# Patient Record
Sex: Female | Born: 1960
Health system: Southern US, Community
[De-identification: ages and names within clinical notes are randomized; demographics above are authoritative.]

## PROBLEM LIST (undated history)

## (undated) DIAGNOSIS — R112 Nausea with vomiting, unspecified: Secondary | ICD-10-CM

## (undated) DIAGNOSIS — G4730 Sleep apnea, unspecified: Secondary | ICD-10-CM

## (undated) DIAGNOSIS — H269 Unspecified cataract: Secondary | ICD-10-CM

## (undated) DIAGNOSIS — T7840XA Allergy, unspecified, initial encounter: Secondary | ICD-10-CM

## (undated) DIAGNOSIS — R011 Cardiac murmur, unspecified: Secondary | ICD-10-CM

## (undated) DIAGNOSIS — G500 Trigeminal neuralgia: Secondary | ICD-10-CM

## (undated) DIAGNOSIS — C801 Malignant (primary) neoplasm, unspecified: Secondary | ICD-10-CM

## (undated) DIAGNOSIS — D239 Other benign neoplasm of skin, unspecified: Secondary | ICD-10-CM

## (undated) DIAGNOSIS — E079 Disorder of thyroid, unspecified: Secondary | ICD-10-CM

## (undated) DIAGNOSIS — G43909 Migraine, unspecified, not intractable, without status migrainosus: Secondary | ICD-10-CM

## (undated) DIAGNOSIS — Z9889 Other specified postprocedural states: Secondary | ICD-10-CM

## (undated) DIAGNOSIS — E119 Type 2 diabetes mellitus without complications: Secondary | ICD-10-CM

## (undated) HISTORY — DX: Allergy, unspecified, initial encounter: T78.40XA

## (undated) HISTORY — DX: Unspecified cataract: H26.9

## (undated) HISTORY — PX: HERNIA REPAIR: SHX51

## (undated) HISTORY — DX: Disorder of thyroid, unspecified: E07.9

## (undated) HISTORY — PX: OTHER SURGICAL HISTORY: SHX169

## (undated) HISTORY — PX: TONSILLECTOMY: SHX5217

## (undated) HISTORY — DX: Type 2 diabetes mellitus without complications: E11.9

---
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
## 1996-09-16 HISTORY — PX: CHOLECYSTECTOMY: SHX55

## 2000-09-16 DIAGNOSIS — C801 Malignant (primary) neoplasm, unspecified: Secondary | ICD-10-CM

## 2000-09-16 HISTORY — PX: ABDOMINAL HYSTERECTOMY: SHX81

## 2000-09-16 HISTORY — DX: Malignant (primary) neoplasm, unspecified: C80.1

## 2005-09-16 DIAGNOSIS — Z86018 Personal history of other benign neoplasm: Secondary | ICD-10-CM

## 2005-09-16 HISTORY — DX: Personal history of other benign neoplasm: Z86.018

## 2007-08-23 DIAGNOSIS — N83209 Unspecified ovarian cyst, unspecified side: Secondary | ICD-10-CM | POA: Insufficient documentation

## 2007-08-23 DIAGNOSIS — K589 Irritable bowel syndrome without diarrhea: Secondary | ICD-10-CM | POA: Insufficient documentation

## 2007-08-23 DIAGNOSIS — K219 Gastro-esophageal reflux disease without esophagitis: Secondary | ICD-10-CM | POA: Insufficient documentation

## 2007-08-23 DIAGNOSIS — G4700 Insomnia, unspecified: Secondary | ICD-10-CM | POA: Insufficient documentation

## 2007-08-23 DIAGNOSIS — E538 Deficiency of other specified B group vitamins: Secondary | ICD-10-CM | POA: Insufficient documentation

## 2008-01-31 ENCOUNTER — Emergency Department: Payer: Self-pay | Admitting: Emergency Medicine

## 2008-02-19 DIAGNOSIS — E1169 Type 2 diabetes mellitus with other specified complication: Secondary | ICD-10-CM | POA: Insufficient documentation

## 2008-02-19 DIAGNOSIS — E781 Pure hyperglyceridemia: Secondary | ICD-10-CM | POA: Insufficient documentation

## 2008-04-26 ENCOUNTER — Ambulatory Visit: Payer: Self-pay | Admitting: Obstetrics and Gynecology

## 2009-01-06 DIAGNOSIS — R7309 Other abnormal glucose: Secondary | ICD-10-CM | POA: Insufficient documentation

## 2009-01-06 DIAGNOSIS — J309 Allergic rhinitis, unspecified: Secondary | ICD-10-CM | POA: Insufficient documentation

## 2009-01-16 ENCOUNTER — Ambulatory Visit: Payer: Self-pay | Admitting: Family Medicine

## 2009-01-17 ENCOUNTER — Ambulatory Visit: Payer: Self-pay | Admitting: Family Medicine

## 2009-01-24 ENCOUNTER — Ambulatory Visit: Payer: Self-pay | Admitting: Family Medicine

## 2009-02-15 ENCOUNTER — Ambulatory Visit: Payer: Self-pay | Admitting: Family Medicine

## 2009-02-22 ENCOUNTER — Ambulatory Visit: Payer: Self-pay | Admitting: Family Medicine

## 2009-02-25 DIAGNOSIS — N390 Urinary tract infection, site not specified: Secondary | ICD-10-CM | POA: Insufficient documentation

## 2009-03-06 ENCOUNTER — Encounter: Admission: RE | Admit: 2009-03-06 | Discharge: 2009-03-06 | Payer: Self-pay | Admitting: Neurology

## 2009-08-23 ENCOUNTER — Ambulatory Visit: Payer: Self-pay | Admitting: Endocrinology

## 2009-11-30 ENCOUNTER — Ambulatory Visit: Payer: Self-pay | Admitting: Family Medicine

## 2010-01-15 ENCOUNTER — Ambulatory Visit: Payer: Self-pay | Admitting: Endocrinology

## 2010-04-05 ENCOUNTER — Encounter: Admission: RE | Admit: 2010-04-05 | Discharge: 2010-04-05 | Payer: Self-pay | Admitting: Neurology

## 2010-05-01 DIAGNOSIS — D239 Other benign neoplasm of skin, unspecified: Secondary | ICD-10-CM

## 2010-05-01 HISTORY — DX: Other benign neoplasm of skin, unspecified: D23.9

## 2013-09-01 ENCOUNTER — Ambulatory Visit: Payer: Self-pay | Admitting: Otolaryngology

## 2014-02-03 ENCOUNTER — Ambulatory Visit (INDEPENDENT_AMBULATORY_CARE_PROVIDER_SITE_OTHER): Payer: Managed Care, Other (non HMO) | Admitting: Podiatrist

## 2014-02-03 ENCOUNTER — Ambulatory Visit (INDEPENDENT_AMBULATORY_CARE_PROVIDER_SITE_OTHER): Payer: Managed Care, Other (non HMO)

## 2014-02-03 ENCOUNTER — Encounter: Payer: Self-pay | Admitting: Podiatrist

## 2014-02-03 VITALS — BP 130/81 | HR 94 | Resp 16 | Ht 67.0 in | Wt 223.0 lb

## 2014-02-03 DIAGNOSIS — M722 Plantar fascial fibromatosis: Secondary | ICD-10-CM

## 2014-02-03 MED ORDER — TRIAMCINOLONE ACETONIDE 40 MG/ML IJ SUSP: 20.0000 mg | Freq: Once | INTRAMUSCULAR | Status: DC

## 2014-02-03 NOTE — Patient Instructions (Signed)

## 2014-02-03 NOTE — Progress Notes (Addendum)
   Subjective:    Patient ID: Veronica Butler, female    DOB: [birth year], 53 y.o.   MRN: [id number]  HPI Comments: Its the heels, arch and going up the back of the leg. The right one has been going on for 2 months, the left one is 1 month. Its gotten worse. Hurts to walk and stand. Morning is really bad. i have massaged my feet, frozen water bottles, stretching and chiropractor did an adjustment on me and taking aleve.  Foot Pain Associated symptoms include fatigue.      Review of Systems  Constitutional: Positive for fatigue.  Endocrine: Positive for cold intolerance and heat intolerance.  Genitourinary: Positive for frequency.  Musculoskeletal:       Joint pain Back pain Muscle pain  All other systems reviewed and are negative.      Objective:   Physical Exam  GENERAL APPEARANCE: Alert, conversant. Appropriately groomed. No acute distress.  VASCULAR: Pedal pulses palpable and strong bilateral.  Capillary refill time is immediate to all digits,  Proximal to distal cooling it warm to warm.  Digital hair growth is present bilateral  NEUROLOGIC: sensation is intact epicritically and protectively to 5.07 monofilament at 5/5 sites bilateral.  Light touch is intact bilateral, vibratory sensation intact bilateral, achilles tendon reflex is intact bilateral.  Negative Tinel sign is elicited MUSCULOSKELETAL: Pain on palpation plantar medial aspect both feet  at insertion of plantar fascia on the medial calcaneal tubercle. Inflammation at the insertion of the plantar fascia is present. High arched foot type is seen. DERMATOLOGIC: skin color, texture, and turger are within normal limits.  No preulcerative lesions are seen, no interdigital maceration noted.  No open lesions present.  Digital nails are asymptomatic.      Assessment & Plan:  Plantar fasciitis, high arched feet bilateral  Plan:  Discussed etiology, pathology, conservative vs. Surgical therapies and at this time a plantar fascial  injection was recommended.  The patient agreed and a sterile skin prep was applied.  An injection consisting of kenalog and marcaine mixture was infiltrated at the point of maximal tenderness on the right heel.  We will do another injection of the left heel in 3 weeks as steroid injections tend to elevate her blood sugar.   The patient tolerated this well and was given instructions for aftercare.  A removable plantar fascia taping was applied.  Recommended superfeet inserts at this time for her exercise shoes.

## 2014-02-16 LAB — CBC AND DIFFERENTIAL
HCT: 42 % (ref 36–46)
Hemoglobin: 14.2 g/dL (ref 12.0–16.0)
Platelets: 229 10*3/uL (ref 150–399)
WBC: 8.2 10^3/mL

## 2014-03-03 ENCOUNTER — Ambulatory Visit (INDEPENDENT_AMBULATORY_CARE_PROVIDER_SITE_OTHER): Payer: Managed Care, Other (non HMO) | Admitting: Podiatrist

## 2014-03-03 ENCOUNTER — Ambulatory Visit (INDEPENDENT_AMBULATORY_CARE_PROVIDER_SITE_OTHER): Payer: Managed Care, Other (non HMO)

## 2014-03-03 VITALS — BP 131/86 | HR 93 | Resp 16

## 2014-03-03 DIAGNOSIS — M2040 Other hammer toe(s) (acquired), unspecified foot: Secondary | ICD-10-CM

## 2014-03-03 DIAGNOSIS — M722 Plantar fascial fibromatosis: Secondary | ICD-10-CM

## 2014-03-03 DIAGNOSIS — M7660 Achilles tendinitis, unspecified leg: Secondary | ICD-10-CM

## 2014-03-03 NOTE — Patient Instructions (Signed)

## 2014-03-03 NOTE — Progress Notes (Signed)
Subjective: Veronica Butler presents today for followup of plantar fasciitis. She states that the last injection I gave her lasted for 2 days and she went back to her chiropractor and received ultrasound therapy for a total of 4 treatments which she states helped more than anything. She also states that her toes on the right foot are starting to contract as well.  Objective: Neurovascular status is unchanged. Mild pain on palpation plantar medial aspect of bilateral heels continued to be palpated. Mild contraction of digits 2, 3, 4 of the right foot is noted. Prominent metatarsal heads right with contracture of lesser digits is noted. She also complains of tightness in the Achilles tendon right.  Assessment: Resolving plantar fasciitis, hammertoe, Achilles tendinitis  Plan: Recommended she continue with her good stretching exercises, use of good supportive shoes, and anti-inflammatory medications. Discussed her hammertoes are very mild in nature and recommended strengthening exercises. In the future she may need hammertoe surgery however at this time she will hold off. She will call if she has any problems, concerns otherwise she'll be seen back as needed for followup.

## 2014-08-25 LAB — HEPATIC FUNCTION PANEL
ALT: 34 U/L (ref 7–35)
AST: 28 U/L (ref 13–35)

## 2014-08-25 LAB — BASIC METABOLIC PANEL
BUN: 19 mg/dL (ref 4–21)
CREATININE: 0.9 mg/dL (ref <=1.1)
GLUCOSE: 143 mg/dL
Potassium: 4.1 mmol/L (ref 3.4–5.3)
Sodium: 138 mmol/L (ref 137–147)

## 2014-08-25 LAB — HEMOGLOBIN A1C: HEMOGLOBIN A1C: 6.5 % — AB (ref 4.0–6.0)

## 2014-08-25 LAB — LIPID PANEL
Cholesterol: 176 mg/dL (ref 0–200)
HDL: 45 mg/dL (ref 35–70)
LDL CALC: 102 mg/dL
Triglycerides: 147 mg/dL (ref 40–160)

## 2014-09-13 ENCOUNTER — Ambulatory Visit: Payer: Self-pay | Admitting: Family Medicine

## 2015-01-06 DIAGNOSIS — Z8541 Personal history of malignant neoplasm of cervix uteri: Secondary | ICD-10-CM | POA: Insufficient documentation

## 2015-01-06 DIAGNOSIS — C539 Malignant neoplasm of cervix uteri, unspecified: Secondary | ICD-10-CM | POA: Insufficient documentation

## 2015-01-06 DIAGNOSIS — I872 Venous insufficiency (chronic) (peripheral): Secondary | ICD-10-CM | POA: Insufficient documentation

## 2015-01-06 DIAGNOSIS — G629 Polyneuropathy, unspecified: Secondary | ICD-10-CM | POA: Insufficient documentation

## 2015-01-06 DIAGNOSIS — G43909 Migraine, unspecified, not intractable, without status migrainosus: Secondary | ICD-10-CM | POA: Insufficient documentation

## 2015-01-06 DIAGNOSIS — E041 Nontoxic single thyroid nodule: Secondary | ICD-10-CM | POA: Insufficient documentation

## 2015-01-06 DIAGNOSIS — G4730 Sleep apnea, unspecified: Secondary | ICD-10-CM | POA: Insufficient documentation

## 2015-02-10 ENCOUNTER — Ambulatory Visit
Admission: RE | Admit: 2015-02-10 | Discharge: 2015-02-10 | Disposition: A | Discharge status: Home / Self Care | Payer: Managed Care, Other (non HMO) | Source: Ambulatory Visit | Attending: Family Medicine | Admitting: Family Medicine

## 2015-02-10 ENCOUNTER — Other Ambulatory Visit: Payer: Self-pay | Admitting: Family Medicine

## 2015-02-10 DIAGNOSIS — S1093XA Contusion of unspecified part of neck, initial encounter: Secondary | ICD-10-CM

## 2015-02-10 DIAGNOSIS — W1839XA Other fall on same level, initial encounter: Secondary | ICD-10-CM | POA: Insufficient documentation

## 2015-02-10 DIAGNOSIS — R51 Headache: Secondary | ICD-10-CM | POA: Diagnosis present

## 2015-02-10 DIAGNOSIS — M25531 Pain in right wrist: Secondary | ICD-10-CM

## 2015-02-10 DIAGNOSIS — Z8541 Personal history of malignant neoplasm of cervix uteri: Secondary | ICD-10-CM | POA: Diagnosis not present

## 2015-02-10 DIAGNOSIS — S0083XA Contusion of other part of head, initial encounter: Principal | ICD-10-CM

## 2015-02-10 DIAGNOSIS — S0003XA Contusion of scalp, initial encounter: Secondary | ICD-10-CM

## 2015-02-10 HISTORY — DX: Malignant (primary) neoplasm, unspecified: C80.1

## 2015-02-22 ENCOUNTER — Encounter: Payer: Self-pay | Admitting: Family Medicine

## 2015-02-22 ENCOUNTER — Ambulatory Visit (INDEPENDENT_AMBULATORY_CARE_PROVIDER_SITE_OTHER): Payer: Managed Care, Other (non HMO) | Admitting: Family Medicine

## 2015-02-22 VITALS — BP 120/84 | HR 84 | Temp 97.4°F | Resp 16 | Ht 67.0 in | Wt 233.0 lb

## 2015-02-22 DIAGNOSIS — K7581 Nonalcoholic steatohepatitis (NASH): Secondary | ICD-10-CM | POA: Diagnosis not present

## 2015-02-22 DIAGNOSIS — R7309 Other abnormal glucose: Secondary | ICD-10-CM

## 2015-02-22 DIAGNOSIS — R748 Abnormal levels of other serum enzymes: Secondary | ICD-10-CM | POA: Diagnosis not present

## 2015-02-22 NOTE — Progress Notes (Signed)
   Subjective:    Patient ID: Veronica Butler, female    DOB: [birth year], 54 y.o.   MRN: [id number]  Diabetes She presents for her follow-up diabetic visit. She has type 2 diabetes mellitus. Her disease course has been stable. There are no hypoglycemic associated symptoms. Associated symptoms include fatigue (chronic). Pertinent negatives for diabetes include no blurred vision, no chest pain, no foot paresthesias, no foot ulcerations, no polydipsia, no polyphagia, no polyuria and no visual change. Symptoms are stable. She is compliant with treatment all of the time. There is no change in her home blood glucose trend. Her breakfast blood glucose is taken between 7-8 am. Eye exam is current.  Prior ov 08/17/2014 check labs.  08/25/2014 Labs: Stable, blood sugar not as good as previous and one liver enzyme elevated. Patient advised to recheck all labs.     Review of Systems  Constitutional: Positive for fatigue (chronic).  Eyes: Negative for blurred vision.  Respiratory: Negative.   Cardiovascular: Negative.  Negative for chest pain.  Gastrointestinal: Negative.   Endocrine: Negative.  Negative for polydipsia, polyphagia and polyuria.  Psychiatric/Behavioral: Negative.    Past Medical History  Diagnosis Date  . Cancer 2002    Cervical CA with hysterectomy.   Past Surgical History  Procedure Laterality Date  . Tonsillectomy  at age 77  . Navel cyst  1980'    removed  . Cholecystectomy  1998  . Hernia repair      along with gallbladder surgery  . Abdominal hysterectomy  2002    still has ovaries    reports that she has never smoked. She has never used smokeless tobacco. She reports that she does not drink alcohol or use illicit drugs. family history includes Alcohol abuse in her father; Cancer in her father and maternal grandmother; Dementia in her maternal grandmother; Hypertension in her mother; Sarcoidosis in her sister. Allergies  Allergen Reactions  . Cortisone Other (See  Comments)    High blood sugar  . Tape Swelling and Dermatitis    Redness and itching.  . Ciprofloxacin Rash  . Floxacillin [Flucloxacillin] Rash  . Levaquin [Levofloxacin In D5w] Rash   Patient Active Problem List   Diagnosis Date Noted  . CA cervix 01/06/2015  . Headache, migraine 01/06/2015  . Neuropathy 01/06/2015  . Apnea, sleep 01/06/2015  . Thyroid nodule 01/06/2015  . Chronic venous insufficiency 01/06/2015  . Chronic urinary tract infection 02/25/2009  . Abnormal blood sugar 01/06/2009  . Allergic rhinitis 01/06/2009  . Hypertriglyceridemia 02/19/2008  . B12 deficiency 08/23/2007  . Acid reflux 08/23/2007  . Cannot sleep 08/23/2007  . Adaptive colitis 08/23/2007  . Cyst of ovary 08/23/2007   Blood pressure 120/84, pulse 84, temperature 97.4 F (36.3 C), temperature source Oral, resp. rate 16, height 5\' 7"  (1.702 m), weight 233 lb (105.688 kg), last menstrual period 11/05/2000.     Objective:   Physical Exam  Constitutional: She appears well-developed and well-nourished.  Neurological: She is alert.  Psychiatric: She has a normal mood and affect.          Assessment & Plan:  1. Abnormal blood sugar Stable. Continue to eat healthy and exercise.  - Hemoglobin A1c  2. Elevated liver enzymes Does have history of NASH. Will recheck. Had work up previously.   - Comprehensive metabolic panel  3. NASH (nonalcoholic steatohepatitis) Continue to work on diet and exercise.

## 2015-03-07 LAB — COMPREHENSIVE METABOLIC PANEL
ALT: 37 IU/L — AB (ref 0–32)
AST: 29 IU/L (ref 0–40)
Albumin/Globulin Ratio: 1.8 (ref 1.1–2.5)
Albumin: 4.4 g/dL (ref 3.5–5.5)
Alkaline Phosphatase: 86 IU/L (ref 39–117)
BUN/Creatinine Ratio: 28 — ABNORMAL HIGH (ref 9–23)
BUN: 21 mg/dL (ref 6–24)
Bilirubin Total: 0.5 mg/dL (ref 0.0–1.2)
CALCIUM: 9.7 mg/dL (ref 8.7–10.2)
CO2: 26 mmol/L (ref 18–29)
CREATININE: 0.74 mg/dL (ref 0.57–1.00)
Chloride: 100 mmol/L (ref 97–108)
GFR calc Af Amer: 105 mL/min/{1.73_m2} (ref >59)
GFR, EST NON AFRICAN AMERICAN: 91 mL/min/{1.73_m2} (ref >59)
GLOBULIN, TOTAL: 2.5 g/dL (ref 1.5–4.5)
GLUCOSE: 144 mg/dL — AB (ref 65–99)
POTASSIUM: 4.6 mmol/L (ref 3.5–5.2)
SODIUM: 142 mmol/L (ref 134–144)
TOTAL PROTEIN: 6.9 g/dL (ref 6.0–8.5)

## 2015-03-07 LAB — HEMOGLOBIN A1C
Est. average glucose Bld gHb Est-mCnc: 157 mg/dL
HEMOGLOBIN A1C: 7.1 % — AB (ref 4.8–5.6)

## 2015-03-10 ENCOUNTER — Telehealth: Payer: Self-pay

## 2015-03-10 NOTE — Telephone Encounter (Signed)
LMTCB 03/10/2015  Thanks,   -Mickel Baas

## 2015-03-10 NOTE — Telephone Encounter (Signed)
Pt advised as directed below.   Thanks,   -Mickel Baas

## 2015-03-10 NOTE — Telephone Encounter (Signed)
-----   Message from Birdie Sons, MD sent at 03/08/2015  9:02 AM EDT ----- HgbA1c is up to 7.1. Need to watch diet better. Liver functions are normal. Recommend she follow up with Dr. Venia Minks in 3 months to recheck HgbA1c.

## 2015-03-16 ENCOUNTER — Ambulatory Visit (INDEPENDENT_AMBULATORY_CARE_PROVIDER_SITE_OTHER): Payer: Managed Care, Other (non HMO)

## 2015-03-16 ENCOUNTER — Encounter: Payer: Self-pay | Admitting: Podiatry

## 2015-03-16 ENCOUNTER — Ambulatory Visit (INDEPENDENT_AMBULATORY_CARE_PROVIDER_SITE_OTHER): Payer: Managed Care, Other (non HMO) | Admitting: Podiatry

## 2015-03-16 VITALS — BP 147/94 | HR 97 | Resp 18

## 2015-03-16 DIAGNOSIS — M76822 Posterior tibial tendinitis, left leg: Secondary | ICD-10-CM

## 2015-03-16 DIAGNOSIS — M722 Plantar fascial fibromatosis: Secondary | ICD-10-CM

## 2015-03-16 DIAGNOSIS — M7670 Peroneal tendinitis, unspecified leg: Secondary | ICD-10-CM | POA: Diagnosis not present

## 2015-03-17 NOTE — Progress Notes (Signed)
Patient ID: Veronica Butler, female   DOB: 12-12-1960, 54 y.o.   MRN: [id number]  Subjective: 54 year old female presents the office today for complaints of bilateral foot pain which has been ongoing for several months.. She states the majority of her pain is the outside aspect of her foot which is worse with walking and prolonged ambulation. She is not having pain at rest. She points just proximal to the fifth metatarsal base of the majority of her pain is localized. Also states that she has pain to the inside aspect of left ankle at times with prolonged ambulation and weightbearing. She denies any history of injury or trauma to the area and she denies any change or increase in activity dependent onset of symptoms. She denies any tingling or numbness. Denies any swelling or any redness. The pain does not wake her up at night. No other complaints at this time in no acute changes since last appointment.  Objective: AAO x3, NAD DP/PT pulses palpable bilaterally, CRT less than 3 seconds Protective sensation intact with Simms Weinstein monofilament, vibratory sensation intact, Achilles tendon reflex intact There is tenderness palpation overlying the course of the peroneal tendons inferior to the lateral malleolus and just proximal to the fifth metatarsal base bilaterally with the left slightly worse than the right. There is mild discomfort with eversion. Peroneal tendons appear to be intact. There is also mild discomfort along the course of the posterior tibial tendon inferior to the medial malleolus of the left foot. There is no pain along the insertion of the navicular tuberosity. She is able to perform a single and double heel rise bilaterally however it is somewhat limited in the left due to pain. There is no tenderness along the course of the insertion of the plantar fascia or the Achilles tendon. There is a decrease in medial arch height upon weightbearing. Equinus is present. No other areas of tenderness to  bilateral lower extremities. MMT 5/5, ROM WNL.  No open lesions or pre-ulcerative lesions.  No overlying edema, erythema, increase in warmth to bilateral lower extremities.  No pain with calf compression, swelling, warmth, erythema bilaterally.   Assessment: 54 year old female with bilateral peroneal tendinitis, left posterior tibial tendinitis  Plan: -X-rays were obtained and reviewed with the patient.  -Treatment options discussed including all alternatives, risks, and complications -We'll hold off on any oral steroid as she has an allergy as well as anti-inflammatories due to GI issues. She has had steroid injections previously for which she states that she had no complications. Discussed a steroid injection around the peroneal tendon on the left side as this seems to be with the majority of her symptoms are localized. I discussed with her risks complications for which she understands #1 consents. Under sterile conditions a total of 1 mL mixture of dexamethasone phosphate and 0.5% Marcaine plain was infiltrated around the peroneal tendon sheath however care was taken to avoid injecting directly into the peroneal tendon. Band-Aid was applied. Postinjection care was discussed with the patient. -Ankle brace on left side. -Discussed stretching activities for the right as well to help with equinus. Discussed night splint but she declined.  -Follow-up 3-4 weeks or sooner if any problems arise. In the meantime, encouraged to call the office with any questions, concerns, change in symptoms.  -She was inquiring about physical therapy. She's had multiple tendinitis issues. If symptoms persist we'll consider physical therapy.  Celesta Gentile, DPM

## 2015-04-06 ENCOUNTER — Ambulatory Visit (INDEPENDENT_AMBULATORY_CARE_PROVIDER_SITE_OTHER): Payer: Managed Care, Other (non HMO) | Admitting: Podiatry

## 2015-04-06 DIAGNOSIS — M7670 Peroneal tendinitis, unspecified leg: Secondary | ICD-10-CM

## 2015-04-06 DIAGNOSIS — M76822 Posterior tibial tendinitis, left leg: Secondary | ICD-10-CM

## 2015-04-06 NOTE — Patient Instructions (Signed)
Peroneal Tendinitis with Rehab Tendonitis is inflammation of a tendon. Inflammation of the tendons on the back of the outer ankle (peroneal tendons) is known as peroneal tendonitis. The peroneal tendons are responsible for connecting the muscles that allow you to stand on your tiptoes to the bones of the ankle. For this reason, peroneal tendonitis often causes pain when trying to complete such motions. Peroneal tendonitis often involves a tear (strain) of the peroneal tendons. Strains are classified into three categories. Grade 1 strains cause pain, but the tendon is not lengthened. Grade 2 strains include a lengthened ligament, due to the ligament being stretched or partially ruptured. With grade 2 strains there is still function, although function may be decreased. Grade 3 strains involve a complete tear of the tendon or muscle, and function is usually impaired. SYMPTOMS   Pain, tenderness, swelling, warmth, or redness over the back of the outer side of the ankle, the outer part of the mid-foot, or the bottom of the arch.  Pain that gets worse with ankle motion (especially when pushing off or pushing down with the front of the foot), or when standing on the ball of the foot or pushing the foot outward.  Crackling sound (crepitation) when the tendon is moved or touched. CAUSES  Peroneal tendinitis occurs when injury to the peroneal tendons causes the body to respond with inflammation. Common causes of injury include:  An overuse injury, in which the groove behind the outer ankle (where the tendon is located) causes wear on the tendon.  A sudden stress placed on the tendon, such as from an increase in the intensity, frequency, or duration of training.  Direct hit (trauma) to the tendon.  Return to activity too soon after a previous ankle injury. RISK INCREASES WITH:  Sports that require sudden, repetitive pushing off of the foot, such as jumping or quick starts.  Kicking and running sports,  especially running down hills or long distances.  Poor strength and flexibility.  Previous injury to the foot, ankle, or leg. PREVENTION  Warm up and stretch properly before activity.  Allow for adequate recovery between workouts.  Maintain physical fitness:  Strength, flexibility, and endurance.  Cardiovascular fitness.  Complete rehabilitation after previous injury. PROGNOSIS  If treated properly, peroneal tendonitis usually heals within 6 weeks.  RELATED COMPLICATIONS  Longer healing time, if not properly treated or if not given enough time to heal.  Recurring symptoms if activity is resumed too soon, with overuse, or when using poor technique.  If untreated, tendinitis may result in tendon rupture, requiring surgery. TREATMENT  Treatment first involves the use of ice and medicine to reduce pain and inflammation. The use of strengthening and stretching exercises may help reduce pain with activity. These exercises may be performed at home or with a therapist. Sometimes, the foot and ankle will be restrained for 10 to 14 days to promote healing. Your caregiver may advise that you place a heel lift in your shoes to reduce the stress placed on the tendon. If nonsurgical treatment is unsuccessful, surgery to remove the inflamed tendon lining (sheath) may be advised.  MEDICATION   If pain medicine is needed, nonsteroidal anti-inflammatory medicines (aspirin and ibuprofen), or other minor pain relievers (acetaminophen), are often advised.  Do not take pain medicine for 7 days before surgery.  Prescription pain relievers may be given, if your caregiver thinks they are needed. Use only as directed and only as much as you need. HEAT AND COLD  Cold treatment (icing) should  be applied for 10 to 15 minutes every 2 to 3 hours for inflammation and pain, and immediately after activity that aggravates your symptoms. Use ice packs or an ice massage.  Heat treatment may be used before  performing stretching and strengthening activities prescribed by your caregiver, physical therapist, or athletic trainer. Use a heat pack or a warm water soak. SEEK MEDICAL CARE IF:  Symptoms get worse or do not improve in 2 to 4 weeks, despite treatment.  New, unexplained symptoms develop. (Drugs used in treatment may produce side effects.) EXERCISES RANGE OF MOTION (ROM) AND STRETCHING EXERCISES - Peroneal Tendinitis These exercises may help you when beginning to rehabilitate your injury. Your symptoms may resolve with or without further involvement from your physician, physical therapist or athletic trainer. While completing these exercises, remember:   Restoring tissue flexibility helps normal motion to return to the joints. This allows healthier, less painful movement and activity.  An effective stretch should be held for at least 30 seconds.  A stretch should never be painful. You should only feel a gentle lengthening or release in the stretched tissue. RANGE OF MOTION - Ankle Eversion  Sit with your right / left ankle crossed over your opposite knee.  Grip your foot with your opposite hand, placing your thumb on the top of your foot and your fingers across the bottom of your foot.  Gently push your foot downward with a slight rotation, so your littlest toes rise slightly toward the ceiling.  You should feel a gentle stretch on the inside of your ankle. Hold the stretch for __________ seconds. Repeat __________ times. Complete this exercise __________ times per day.  RANGE OF MOTION - Ankle Inversion  Sit with your right / left ankle crossed over your opposite knee.  Grip your foot with your opposite hand, placing your thumb on the bottom of your foot and your fingers across the top of your foot.  Gently pull your foot so the smallest toe comes toward you and your thumb pushes the inside of the ball of your foot away from you.  You should feel a gentle stretch on the outside of  your ankle. Hold the stretch for __________ seconds. Repeat __________ times. Complete this exercise __________ times per day.  RANGE OF MOTION - Ankle Plantar Flexion  Sit with your right / left leg crossed over your opposite knee.  Use your opposite hand to pull the top of your foot and toes toward you.  You should feel a gentle stretch on the top of your foot and ankle. Hold this position for __________ seconds. Repeat __________ times. Complete __________ times per day.  STRETCH - Gastroc, Standing  Place your hands on a wall.  Extend your right / left leg behind you, keeping the front knee somewhat bent.  Slightly point your toes inward on your back foot.  Keeping your right / left heel on the floor and your knee straight, shift your weight toward the wall, not allowing your back to arch.  You should feel a gentle stretch in the calf. Hold this position for __________ seconds. Repeat __________ times. Complete this stretch __________ times per day. STRETCH - Soleus, Standing  Place your hands on a wall.  Extend your right / left leg behind you, keeping the other knee somewhat bent.  Slightly point your toes inward on your back foot.  Keep your heel on the floor, bend your back knee, and slightly shift your weight over the back leg so that  you feel a gentle stretch deep in your back calf.  Hold this position for __________ seconds. Repeat __________ times. Complete this stretch __________ times per day. STRETCH - Gastrocsoleus, Standing Note: This exercise can place a lot of stress on your foot and ankle. Please complete this exercise only if specifically instructed by your caregiver.   Place the ball of your right / left foot on a step, keeping your other foot firmly on the same step.  Hold on to the wall or a rail for balance.  Slowly lift your other foot, allowing your body weight to press your heel down over the edge of the step.  You should feel a stretch in your  right / left calf.  Hold this position for __________ seconds.  Repeat this exercise with a slight bend in your knee. Repeat __________ times. Complete this stretch __________ times per day.  STRENGTHENING EXERCISES - Peroneal Tendinitis  These exercises may help you when beginning to rehabilitate your injury. They may resolve your symptoms with or without further involvement from your physician, physical therapist or athletic trainer. While completing these exercises, remember:   Muscles can gain both the endurance and the strength needed for everyday activities through controlled exercises.  Complete these exercises as instructed by your physician, physical therapist or athletic trainer. Increase the resistance and repetitions only as guided by your caregiver. STRENGTH - Dorsiflexors  Secure a rubber exercise band or tubing to a fixed object (table, pole) and loop the other end around your right / left foot.  Sit on the floor facing the fixed object. The band should be slightly tense when your foot is relaxed.  Slowly draw your foot back toward you, using your ankle and toes.  Hold this position for __________ seconds. Slowly release the tension in the band and return your foot to the starting position. Repeat __________ times. Complete this exercise __________ times per day.  STRENGTH - Towel Curls  Sit in a chair, on a non-carpeted surface.  Place your foot on a towel, keeping your heel on the floor.  Pull the towel toward your heel only by curling your toes. Keep your heel on the floor.  If instructed by your physician, physical therapist or athletic trainer, add weight to the end of the towel. Repeat __________ times. Complete this exercise __________ times per day. STRENGTH - Ankle Eversion   Secure one end of a rubber exercise band or tubing to a fixed object (table, pole). Loop the other end around your foot, just before your toes.  Place your fists between your knees.  This will focus your strengthening at your ankle.  Drawing the band across your opposite foot, away from the pole, slowly, pull your little toe out and up. Make sure the band is positioned to resist the entire motion.  Hold this position for __________ seconds.  Have your muscles resist the band, as it slowly pulls your foot back to the starting position. Repeat __________ times. Complete this exercise __________ times per day.  Document Released: 09/02/2005 Document Revised: 01/17/2014 Document Reviewed: 12/15/2008 [facility] Patient Information 2015 [place], [place]. This information is not intended to replace advice given to you by your health care provider. Make sure you discuss any questions you have with your health care provider.

## 2015-04-07 NOTE — Progress Notes (Signed)
Patient ID: LAVRA IMLER, female   DOB: [birth year], 54 y.o.   MRN: [id number]  Subjective: 54 year old female presents the office if up evaluation of bilateral peroneal tendinitis and left posterior tibial tendinitis. She states that since last appointment the injection did help considerably to the left side along the peroneal tendons. She states the pain in the inside aspect of her ankle has resolved. She has stopped wearing the ankle brace. She does that she these pain to the outside aspect of her right ankles well. She is requesting an injection at this time as it seemed to help on the left side. Also she is asking for a night splint of the area does feel tight. Denies any swelling or redness. No other complaints at this time.  Objective: AAO 3, NAD Neurovascular status unchanged This time there is no specific tenderness along the course or insertion of the posterior tibial tendon on the left side on the course last insertion of the peroneal tendon. There is no overlying edema, erythema, increased warmth. There is no areas of tenderness to left lower extremity. On the right-sided continues to be mild discomfort along the lateral aspect of the foot along the course of the peroneal tendon and along the lateral aspect of the sinus tarsi. There is no pain or crepitation with subtalar joint range of motion. No other areas tenderness to the right lower extremity. Decrease in medial arch height. Equinus is present. There is no overlying edema, erythema, increase in warmth bilaterally. No open lesions or pre-ulcer lesions No pain with calf compression, swelling, warmth, erythema.  Assessment: 54 year old female with resolving left foot tendinitis, continued right foot tendinitis  Plan: -Treatment options discussed including all alternatives, risks, and complications -At this time she is requesting a steroid injection to the right foot as well. Under sterile conditions a total of 1 mL mixture of  dexamethasone phosphate and 0.5% Marcaine plain was infiltrated along the lateral aspect of the foot just superior to the peroneal tendon where the area of maximal tenderness is. Care was taken not to inject recommended the tendon. Band-Aid was applied. Tolerated the injection without complications. Post injection care was discussed. -Ankle brace on the right foot -Dispensed night splint per her request -Ice daily -Shoegear modifications -Start rehabilitation activities left foot. -Discussed orthotics -Follow-up 3-4 weeks or sooner if any problems arise. In the meantime, encouraged to call the office with any questions, concerns, change in symptoms.   Celesta Gentile, DPM

## 2015-05-02 ENCOUNTER — Ambulatory Visit (INDEPENDENT_AMBULATORY_CARE_PROVIDER_SITE_OTHER): Payer: Managed Care, Other (non HMO) | Admitting: Podiatry

## 2015-05-02 ENCOUNTER — Encounter: Payer: Self-pay | Admitting: Podiatry

## 2015-05-02 VITALS — BP 149/92 | HR 98 | Resp 18

## 2015-05-02 DIAGNOSIS — M7670 Peroneal tendinitis, unspecified leg: Secondary | ICD-10-CM

## 2015-05-02 DIAGNOSIS — M6740 Ganglion, unspecified site: Secondary | ICD-10-CM | POA: Diagnosis not present

## 2015-05-02 NOTE — Progress Notes (Signed)
Patient ID: Veronica Butler, female   DOB: Apr 10, 1961, 54 y.o.   MRN: [id number]  Subjective: 83 shell female presents the office they for follow-up evaluation of bilateral foot tendinitis, lateral foot pain. She states that overall she is approximate 60% better than what she was. She does continue to wear sandals and she does wear the ankle brace intermittently although she did not have a today. She is continue the stretching/rehabilitation exercises. She denies any recent injury or trauma. Denies any swelling or redness. No tenderness. The pain does not wake her up at night. No other complaints at this time. No acute changes since last appointment.  Objective: AAO 3, NAD DP/PT pulses palpable, CRT less than 3 seconds Protective sensation intact with Simms Weinstein monofilament There is mild continued tenderness to palpation along the course of the peroneal tendons posterior to lateral malleolus as well as inferior. There is mild pain with eversion of the foot along the course of the peroneal tendons although they appear to be intact. There is no tenderness to palpation overlying the plantar medial tubercle of the calcaneus bilaterally at the insertion the plantar fascia. There is no pain on the course the plantar fascia. There is no other areas of pinpoint bony tenderness or pain the vibratory sensation to bilateral lower extremities. There is a small fluid-filled and encapsulated soft tissue mass on the dorsal aspect of the right midfoot which is likely a small ganglion cyst. There is no tenderness palpation overlying the area. There is no overlying skin change. No open lesions or pre-ulcerative lesions. calf compression, swelling, warmth, erythema.  Assessment: 54 year old female with resolving bilateral foot tendinitis, likely small ganglion cyst dorsal right foot  Plan: -Treatment options discussed including all alternatives, risks, and complications -In regards to the small cyst I discussed  steroid injection however she wishes to hold off on the treatment and observe the area. There is any changes call the office. -Recommended to continue with supportive shoe gear. She typically wears a sandal which is flat what seems to aggravate the symptoms. Recommended tennis shoe or supportive shoe. I also discuss orthotics with the patient. She will look at an over-the-counter pair. Discussed with her what to look form purchasing these. -Follow-up 6 weeks or sooner if any problems arise. In the meantime, encouraged to call the office with any questions, concerns, change in symptoms.   Celesta Gentile, DPM

## 2015-06-01 ENCOUNTER — Encounter: Payer: Self-pay | Admitting: Family Medicine

## 2015-06-01 ENCOUNTER — Ambulatory Visit (INDEPENDENT_AMBULATORY_CARE_PROVIDER_SITE_OTHER): Payer: Managed Care, Other (non HMO) | Admitting: Family Medicine

## 2015-06-01 VITALS — BP 120/80 | HR 86 | Temp 97.8°F | Resp 16 | Wt 237.6 lb

## 2015-06-01 DIAGNOSIS — R51 Headache: Secondary | ICD-10-CM

## 2015-06-01 DIAGNOSIS — R519 Headache, unspecified: Secondary | ICD-10-CM

## 2015-06-01 DIAGNOSIS — Z23 Encounter for immunization: Secondary | ICD-10-CM

## 2015-06-01 MED ORDER — HYDROCODONE-ACETAMINOPHEN 5-325 MG PO TABS: ORAL_TABLET | ORAL | Status: DC

## 2015-06-01 NOTE — Patient Instructions (Signed)
Update dental exam and add two Aleve twice daily with food.

## 2015-06-01 NOTE — Progress Notes (Signed)
Subjective:     Patient ID: Veronica Butler, female   DOB: May 19, 1961, 54 y.o.   MRN: [id number]  HPI  Chief Complaint  Patient presents with  . Facial Pain    Patient comes in office today with concerns of facial pain on hte left side of her face since Sun 9/11. Patient states that pain starts at her jaw line and radiates up to her temple of her head.   States it is deep dull ache in her right lower jaw with tooth sensitivity felt. Denies pain with eating or bruxism. Reports hx 6 years ago of trigeminal neuralgia.   Review of Systems  Constitutional: Negative for fever and chills.       Objective:   Physical Exam  Constitutional: She appears well-developed and well-nourished. No distress.  HENT:  Teeth appear in good repair without tenderness on percussion or gum swelling noted  Musculoskeletal:  No TMJ tenderness  Lymphadenopathy:    She has no cervical adenopathy.  Skin: No erythema.  No facial swelling or specific areas of tenderness       Assessment:    1. Left facial pain - HYDROcodone-acetaminophen (NORCO/VICODIN) 5-325 MG per tablet; One every 4-6 hours as needed for pain  Dispense: 28 tablet; Refill: 0  2. Need for influenza vaccination - Flu Vaccine QUAD 36+ mos IM   Plan:    Discussed use of Aleve and to f/u with her dentist. Consider neurology referral.

## 2015-06-13 ENCOUNTER — Ambulatory Visit (INDEPENDENT_AMBULATORY_CARE_PROVIDER_SITE_OTHER): Payer: Managed Care, Other (non HMO) | Admitting: Podiatry

## 2015-06-13 ENCOUNTER — Encounter: Payer: Self-pay | Admitting: Podiatry

## 2015-06-13 DIAGNOSIS — M7670 Peroneal tendinitis, unspecified leg: Secondary | ICD-10-CM

## 2015-06-14 NOTE — Progress Notes (Signed)
Patient ID: Veronica Butler, female   DOB: 21-Aug-1961, 54 y.o.   MRN: [id number]  Subjective: 54 year old female presents the office today for follow-up evaluation of tendinitis of bilateral foot. She states that she has purchased new inserts his last appointment in the mornings supportive shoe gear. She states that her pain is much better since last appointment. She did participate in 5K has been walking on a treadmill without any pain. She has no pain with regular activity. Denies any swelling or redness. No other complaints at this time in no acute changes/appointment.  Objective: AAO 3, NAD Neurovascular status intact and unchanged At this time there is no tenderness to palpation on the course of the peroneal tendons. There is no area pinpoint bony tenderness or pain the vibratory sensation to bilateral lower extremities. MMT 5/5, ROM WNL. No open lesions or pre-ulcerative lesions. There is no pain with calf compression, swelling, warmth, erythema.  Assessment: 54 year old female resolving tendinitis bilateral feet  Plan: -Treatment options discussed including all alternatives, risks, and complications -Continue supportive shoe gear and orthotics. Continue with rehabilitation exercises. Ice as needed. -Follow-up of symptoms recur or sooner if any problems are to arise or any change. In the meantime call the office with any questions, concerns, change in symptoms.  Celesta Gentile, DPM

## 2015-08-03 ENCOUNTER — Telehealth: Payer: Self-pay | Admitting: Family Medicine

## 2015-08-03 NOTE — Telephone Encounter (Signed)
Pt received a jury duty notice and she wants to know if you can write her a note so she does not have to serve.  She says she has an overactive bladder.  Her call back is [phone]  Thanks teri

## 2015-08-03 NOTE — Telephone Encounter (Signed)
We do not have this diagnosis in her records, just recurrent UTIs. Do not see that she is on medication for this either. Can try a medication for over active bladder or referral to urologist if she has not seen one. If is followed by urologist, then they may be able to write letter. Thanks.

## 2015-08-04 NOTE — Telephone Encounter (Signed)
She said she has seen a urologist and has taken medication.  She said she will check with her urologist regarding the letter.  Thanks Con Memos

## 2015-08-21 LAB — HM DIABETES EYE EXAM

## 2015-08-22 ENCOUNTER — Encounter: Payer: Self-pay | Admitting: Family Medicine

## 2015-08-22 ENCOUNTER — Ambulatory Visit (INDEPENDENT_AMBULATORY_CARE_PROVIDER_SITE_OTHER): Payer: Managed Care, Other (non HMO) | Admitting: Family Medicine

## 2015-08-22 VITALS — BP 128/82 | HR 94 | Temp 98.5°F | Resp 16 | Wt 238.0 lb

## 2015-08-22 DIAGNOSIS — E119 Type 2 diabetes mellitus without complications: Secondary | ICD-10-CM | POA: Insufficient documentation

## 2015-08-22 DIAGNOSIS — E041 Nontoxic single thyroid nodule: Secondary | ICD-10-CM | POA: Diagnosis not present

## 2015-08-22 DIAGNOSIS — E538 Deficiency of other specified B group vitamins: Secondary | ICD-10-CM

## 2015-08-22 DIAGNOSIS — K7581 Nonalcoholic steatohepatitis (NASH): Secondary | ICD-10-CM | POA: Diagnosis not present

## 2015-08-22 DIAGNOSIS — E781 Pure hyperglyceridemia: Secondary | ICD-10-CM | POA: Diagnosis not present

## 2015-08-22 HISTORY — DX: Type 2 diabetes mellitus without complications: E11.9

## 2015-08-22 LAB — POCT GLYCOSYLATED HEMOGLOBIN (HGB A1C)
ESTIMATED AVERAGE GLUCOSE: 143
HEMOGLOBIN A1C: 6.6

## 2015-08-22 NOTE — Progress Notes (Addendum)
Patient ID: Veronica Butler, female   DOB: [birth year], 54 y.o.   MRN: [id number]       Patient: Veronica Butler Female    DOB: 09/23/1960   54 y.o.   MRN: [id number] Visit Date: 08/22/2015  Today's Provider: Margarita Rana, MD   Chief Complaint  Patient presents with  . Diabetes    6 month F/U.    Subjective:    HPI  Diabetes Mellitus Type II, Follow-up:   Lab Results  Component Value Date   HGBA1C 6.6 08/22/2015   HGBA1C 7.1* 03/06/2015   HGBA1C 6.5* 08/25/2014    Last seen for diabetes 6 months ago.  Management since then includes none. She reports fair compliance with treatment. She is not having side effects.  Current symptoms include none and have been stable. Home blood sugar records: fasting range: 129  Episodes of hypoglycemia? no   Most Recent Eye Exam: < 2 months ago, which was normal per patient.  Weight trend: stable Current diet: in general, a "healthy" diet   Current exercise: none  Pertinent Labs:    Component Value Date/Time   CHOL 176 08/25/2014   TRIG 147 08/25/2014   HDL 45 08/25/2014   LDLCALC 102 08/25/2014   CREATININE 0.74 03/06/2015 1132   CREATININE 0.9 08/25/2014    Wt Readings from Last 3 Encounters:  08/22/15 238 lb (107.956 kg)  06/01/15 237 lb 9.6 oz (107.775 kg)  02/22/15 233 lb (105.688 kg)    ------------------------------------------------------------------------       Allergies  Allergen Reactions  . Cortisone Other (See Comments)    High blood sugar  . Tape Swelling and Dermatitis    Redness and itching.  . Ciprofloxacin Rash  . Floxacillin [Flucloxacillin] Rash  . Levaquin [Levofloxacin In D5w] Rash   Previous Medications   BLOOD GLUCOSE MONITORING SUPPL (ONE TOUCH ULTRA SYSTEM KIT) W/DEVICE KIT    GLUCOMETER TEST STRIPS - Historical Medication  Check blood sugar daily ---- ULTRA ONE TOUCH  Started 11-Jul-2009 Active Comments: DX: 790.29   CRANBERRY 500 MG CAPS    Take 1 capsule by mouth daily.   DIGESTIVE  ENZYMES PO    Take 1 capsule by mouth daily.   HYDROCHLOROTHIAZIDE (HYDRODIURIL) 12.5 MG TABLET    Take 12.5 mg by mouth 3 (three) times a week.   HYDROCODONE-ACETAMINOPHEN (NORCO/VICODIN) 5-325 MG PER TABLET    One every 4-6 hours as needed for pain   MAGNESIUM 500 MG CAPS    Take 1 tablet by mouth daily.   MISC NATURAL PRODUCTS (ADRENAL) 200 MG CAPS    Take 1 capsule by mouth 4 (four) times daily.   MULTIPLE VITAMIN PO    Take 1 tablet by mouth daily.   OMEGA-3 FATTY ACIDS (FISH OIL) 1000 MG CAPS    Take 1 capsule by mouth 3 (three) times daily.   PROBIOTIC PRODUCT (PROBIOTIC ADVANCED) CAPS    Take 1 capsule by mouth daily.   SYNTHROID 25 MCG TABLET       VITAMIN B-12 (CYANOCOBALAMIN) 1000 MCG TABLET    Take 1 tablet by mouth daily.    Review of Systems  Constitutional: Negative.   HENT: Positive for congestion, postnasal drip and rhinorrhea.        Has allergies.   Respiratory: Negative.   Cardiovascular: Negative.   Endocrine: Negative.   Musculoskeletal: Negative.   Allergic/Immunologic: Positive for environmental allergies.    Social History  Substance Use Topics  . Smoking status: Never Smoker   .  Smokeless tobacco: Never Used  . Alcohol Use: No   Objective:   BP 128/82 mmHg  Pulse 94  Temp(Src) 98.5 F (36.9 C)  Resp 16  Wt 238 lb (107.956 kg)  SpO2 98%  LMP 11/05/2000  Physical Exam  Constitutional: She is oriented to person, place, and time. She appears well-developed and well-nourished.  Cardiovascular: Normal rate and regular rhythm.   Pulmonary/Chest: Effort normal and breath sounds normal.  Neurological: She is alert and oriented to person, place, and time.  Psychiatric: She has a normal mood and affect. Her behavior is normal. Judgment and thought content normal.   Diabetic Foot Exam - Simple   Simple Foot Form  Diabetic Foot exam was performed with the following findings:  Yes 08/22/2015  4:17 PM  Visual Inspection  No deformities, no ulcerations, no  other skin breakdown bilaterally:  Yes  Sensation Testing  Intact to touch and monofilament testing bilaterally:  Yes  Pulse Check  Posterior Tibialis and Dorsalis pulse intact bilaterally:  Yes  Comments        Assessment & Plan:     1. Type 2 diabetes mellitus without complication, without long-term current use of insulin (HCC) Improved. She is not clear why. Was maybe more active. Will try to continue to adjust activity and recheck labs in 3 to 6 months.   - POCT glycosylated hemoglobin (Hb A1C) - Comprehensive metabolic panel Results for orders placed or performed in visit on 08/22/15  POCT glycosylated hemoglobin (Hb A1C)  Result Value Ref Range   Hemoglobin A1C 6.6    Est. average glucose Bld gHb Est-mCnc 143     2. NASH (nonalcoholic steatohepatitis) Will check labs.   - CBC with Differential/Platelet  3. B12 deficiency Recheck labs.  - Vitamin B12  4. Thyroid nodule Will check labs.  - TSH  5. Hypertriglyceridemia Will check labs.   - Lipid panel      Margarita Rana, MD  [facility]

## 2015-08-25 ENCOUNTER — Ambulatory Visit: Payer: Managed Care, Other (non HMO) | Admitting: Family Medicine

## 2015-08-26 LAB — LIPID PANEL
CHOLESTEROL TOTAL: 177 mg/dL (ref 100–199)
Chol/HDL Ratio: 4.1 ratio units (ref 0.0–4.4)
HDL: 43 mg/dL (ref >39)
LDL Calculated: 98 mg/dL (ref 0–99)
Triglycerides: 180 mg/dL — ABNORMAL HIGH (ref 0–149)
VLDL Cholesterol Cal: 36 mg/dL (ref 5–40)

## 2015-08-26 LAB — COMPREHENSIVE METABOLIC PANEL
ALBUMIN: 4.4 g/dL (ref 3.5–5.5)
ALT: 39 IU/L — ABNORMAL HIGH (ref 0–32)
AST: 30 IU/L (ref 0–40)
Albumin/Globulin Ratio: 2 (ref 1.1–2.5)
Alkaline Phosphatase: 77 IU/L (ref 39–117)
BUN / CREAT RATIO: 20 (ref 9–23)
BUN: 16 mg/dL (ref 6–24)
Bilirubin Total: 0.6 mg/dL (ref 0.0–1.2)
CALCIUM: 9.3 mg/dL (ref 8.7–10.2)
CO2: 25 mmol/L (ref 18–29)
CREATININE: 0.79 mg/dL (ref 0.57–1.00)
Chloride: 101 mmol/L (ref 97–106)
GFR calc Af Amer: 97 mL/min/{1.73_m2} (ref >59)
GFR, EST NON AFRICAN AMERICAN: 84 mL/min/{1.73_m2} (ref >59)
GLOBULIN, TOTAL: 2.2 g/dL (ref 1.5–4.5)
Glucose: 154 mg/dL — ABNORMAL HIGH (ref 65–99)
Potassium: 4.8 mmol/L (ref 3.5–5.2)
SODIUM: 140 mmol/L (ref 136–144)
Total Protein: 6.6 g/dL (ref 6.0–8.5)

## 2015-08-26 LAB — CBC WITH DIFFERENTIAL/PLATELET
BASOS: 0 %
Basophils Absolute: 0 10*3/uL (ref 0.0–0.2)
EOS (ABSOLUTE): 0.2 10*3/uL (ref 0.0–0.4)
EOS: 3 %
HEMOGLOBIN: 14.3 g/dL (ref 11.1–15.9)
Hematocrit: 42.4 % (ref 34.0–46.6)
IMMATURE GRANS (ABS): 0 10*3/uL (ref 0.0–0.1)
IMMATURE GRANULOCYTES: 0 %
Lymphocytes Absolute: 3 10*3/uL (ref 0.7–3.1)
Lymphs: 34 %
MCH: 29.4 pg (ref 26.6–33.0)
MCHC: 33.7 g/dL (ref 31.5–35.7)
MCV: 87 fL (ref 79–97)
Monocytes Absolute: 0.5 10*3/uL (ref 0.1–0.9)
Monocytes: 6 %
NEUTROS PCT: 57 %
Neutrophils Absolute: 5 10*3/uL (ref 1.4–7.0)
PLATELETS: 259 10*3/uL (ref 150–379)
RBC: 4.87 x10E6/uL (ref 3.77–5.28)
RDW: 13.1 % (ref 12.3–15.4)
WBC: 8.8 10*3/uL (ref 3.4–10.8)

## 2015-08-26 LAB — VITAMIN B12: Vitamin B-12: 1587 pg/mL — ABNORMAL HIGH (ref 211–946)

## 2015-08-26 LAB — TSH: TSH: 2.77 u[IU]/mL (ref 0.450–4.500)

## 2015-08-29 ENCOUNTER — Telehealth: Payer: Self-pay

## 2015-08-29 NOTE — Telephone Encounter (Signed)
LMTCB 08/29/2015  Thanks,   -Mickel Baas

## 2015-08-29 NOTE — Telephone Encounter (Signed)
-----   Message from Margarita Rana, MD sent at 08/26/2015 12:47 PM EST ----- Labs stable.  PLease notify patient  Thanks.

## 2015-08-29 NOTE — Telephone Encounter (Signed)
Pt advised as directed below.   Thanks,   -Veronica Butler  

## 2015-09-05 ENCOUNTER — Other Ambulatory Visit: Payer: Self-pay

## 2015-09-05 DIAGNOSIS — R609 Edema, unspecified: Secondary | ICD-10-CM

## 2015-09-05 MED ORDER — HYDROCHLOROTHIAZIDE 12.5 MG PO TABS: 12.5000 mg | ORAL_TABLET | Freq: Every day | ORAL | Status: DC

## 2015-10-11 ENCOUNTER — Other Ambulatory Visit: Payer: Self-pay

## 2015-10-11 DIAGNOSIS — R609 Edema, unspecified: Secondary | ICD-10-CM

## 2015-10-11 MED ORDER — HYDROCHLOROTHIAZIDE 12.5 MG PO TABS: 12.5000 mg | ORAL_TABLET | Freq: Every day | ORAL | Status: DC

## 2015-10-11 NOTE — Telephone Encounter (Signed)
Patient reports that she changed her insurance and mail order pharmacy. Patient is requesting new prescription sent to [facility] mail order. sd

## 2015-12-04 ENCOUNTER — Other Ambulatory Visit: Payer: Self-pay | Admitting: Family Medicine

## 2016-01-16 ENCOUNTER — Encounter: Payer: Self-pay | Admitting: Family Medicine

## 2016-01-16 ENCOUNTER — Ambulatory Visit (INDEPENDENT_AMBULATORY_CARE_PROVIDER_SITE_OTHER): Payer: 59 | Admitting: Family Medicine

## 2016-01-16 VITALS — BP 138/80 | HR 93 | Temp 98.2°F | Resp 16 | Wt 235.0 lb

## 2016-01-16 DIAGNOSIS — G500 Trigeminal neuralgia: Secondary | ICD-10-CM | POA: Diagnosis not present

## 2016-01-16 DIAGNOSIS — G4730 Sleep apnea, unspecified: Secondary | ICD-10-CM

## 2016-01-16 DIAGNOSIS — E538 Deficiency of other specified B group vitamins: Secondary | ICD-10-CM | POA: Diagnosis not present

## 2016-01-16 MED ORDER — CARBAMAZEPINE ER 100 MG PO TB12
100.0000 mg | ORAL_TABLET | Freq: Two times a day (BID) | ORAL | Status: DC
Start: 2016-01-16 — End: 2017-08-01

## 2016-01-16 NOTE — Progress Notes (Signed)
Patient ID: Veronica Butler, female   DOB: [birth year], 55 y.o.   MRN: [id number]       Patient: Veronica Butler Female    DOB: 1960-12-13   55 y.o.   MRN: [id number] Visit Date: 01/16/2016  Today's Provider: Margarita Rana, MD   Chief Complaint  Patient presents with  . Facial Pain  . Fatigue   Subjective:    HPI Facial pain: Patient reports that she had first episodes in 2010. Patient reports pain and numbness on left side of face since September, reports pain worsening over the weekend. Patient reports sharp intense pain. Patient denies dental pain or ear pain.Patient denies swelling or redness. Patient reports that she takes OTC Tylenol or Ibuprofen reports mild relief.     Fatigue: Patient complains of fatigue. Symptoms began several years ago. Sentinal symptom the patient feels fatigue began with: witnessed or suspected sleep apnea. Symptoms of her fatigue have been anxiousness, feelings of depression and lack of interest in usual activities. Patient describes the following psychologic symptoms: depression and stress at work.  Patient denies GI blood loss. Symptoms have gradually worsened. Severity has been struggles to carry out day to day responsibilities.. Previous visits for this problem: Patient has a CPAP at home but has not been able to wear due to facial pain since September.   Allergies  Allergen Reactions  . Cortisone Other (See Comments)    High blood sugar  . Tape Swelling and Dermatitis    Redness and itching.  . Ciprofloxacin Rash  . Floxacillin [Flucloxacillin] Rash  . Levaquin [Levofloxacin In D5w] Rash   Previous Medications   BLOOD GLUCOSE MONITORING SUPPL (ONE TOUCH ULTRA SYSTEM KIT) W/DEVICE KIT    GLUCOMETER TEST STRIPS - Historical Medication  Check blood sugar daily ---- ULTRA ONE TOUCH  Started 11-Jul-2009 Active Comments: DX: 790.29   CRANBERRY 500 MG CAPS    Take 1 capsule by mouth daily.   DIGESTIVE ENZYMES PO    Take 1 capsule by mouth daily.   HYDROCHLOROTHIAZIDE (HYDRODIURIL) 12.5 MG TABLET    Take 1 tablet by mouth  daily   MAGNESIUM 500 MG CAPS    Take 2 tablets by mouth daily.    MISC NATURAL PRODUCTS (ADRENAL) 200 MG CAPS    Take 1 capsule by mouth 2 (two) times daily.    MULTIPLE VITAMIN PO    Take 3 tablets by mouth daily.    OMEGA-3 FATTY ACIDS (FISH OIL) 1000 MG CAPS    Take 3 capsules by mouth daily.    PROBIOTIC PRODUCT (PROBIOTIC ADVANCED) CAPS    Take 1 capsule by mouth daily.   SYNTHROID 25 MCG TABLET    Take 25 mcg by mouth daily before breakfast. 50 mcg on the weekends   VITAMIN B-12 (CYANOCOBALAMIN) 1000 MCG TABLET    Take 1 tablet by mouth.     Review of Systems  Constitutional: Negative.   HENT: Negative.   Cardiovascular: Negative.   Gastrointestinal: Negative.   Allergic/Immunologic: Positive for environmental allergies.    Social History  Substance Use Topics  . Smoking status: Never Smoker   . Smokeless tobacco: Never Used  . Alcohol Use: No   Objective:   BP 138/80 mmHg  Pulse 93  Temp(Src) 98.2 F (36.8 C) (Oral)  Resp 16  Wt 235 lb (106.595 kg)  SpO2 96%  LMP 11/05/2000  Physical Exam  Constitutional: She is oriented to person, place, and time. She appears well-developed and well-nourished.  HENT:  Head:  Normocephalic and atraumatic.  No tenderness at jaw.    Eyes: EOM are normal. Pupils are equal, round, and reactive to light.  Cardiovascular: Normal rate and regular rhythm.   Pulmonary/Chest: Effort normal and breath sounds normal.  Neurological: She is alert and oriented to person, place, and time.  Psychiatric: She has a normal mood and affect. Her behavior is normal. Judgment and thought content normal.      Assessment & Plan:      1. Trigeminal neuralgia of left side of face New problem. Really recurrent. Will check labs, start medication and refer to neurology for further evaluation and treatment. Will also check labs for fatigue, although suspect related to sleep apnea.     - CBC with Differential/Platelet - Sedimentation rate - Vitamin B12 - TSH - Ambulatory referral to Neurology - carbamazepine (TEGRETOL XR) 100 MG 12 hr tablet; Take 1 tablet (100 mg total) by mouth 2 (two) times daily. Increase to 2 tabs bid if needed  Dispense: 120 tablet; Refill: 5  2. B12 deficiency Will check labs.    3. Apnea, sleep Not currently using CPAP secondary to pressure on her face causing increased pain.  Will refer to ENT.   - Ambulatory referral to ENT  Patient seen and examined by Dr. Jerrell Belfast, and note scribed by Philbert Riser. Dimas, CMA.  I have reviewed the document for accuracy and completeness and I agree with above. - Jerrell Belfast, MD   Margarita Rana, MD  [facility]

## 2016-01-17 ENCOUNTER — Telehealth: Payer: Self-pay

## 2016-01-17 LAB — CBC WITH DIFFERENTIAL/PLATELET
BASOS: 0 %
Basophils Absolute: 0 10*3/uL (ref 0.0–0.2)
EOS (ABSOLUTE): 0.2 10*3/uL (ref 0.0–0.4)
EOS: 2 %
HEMATOCRIT: 43.4 % (ref 34.0–46.6)
HEMOGLOBIN: 14.6 g/dL (ref 11.1–15.9)
IMMATURE GRANS (ABS): 0 10*3/uL (ref 0.0–0.1)
Immature Granulocytes: 0 %
LYMPHS: 25 %
Lymphocytes Absolute: 2.6 10*3/uL (ref 0.7–3.1)
MCH: 29.1 pg (ref 26.6–33.0)
MCHC: 33.6 g/dL (ref 31.5–35.7)
MCV: 87 fL (ref 79–97)
MONOCYTES: 6 %
Monocytes Absolute: 0.6 10*3/uL (ref 0.1–0.9)
NEUTROS ABS: 6.7 10*3/uL (ref 1.4–7.0)
Neutrophils: 67 %
Platelets: 254 10*3/uL (ref 150–379)
RBC: 5.02 x10E6/uL (ref 3.77–5.28)
RDW: 13.6 % (ref 12.3–15.4)
WBC: 10.1 10*3/uL (ref 3.4–10.8)

## 2016-01-17 LAB — TSH: TSH: 0.884 u[IU]/mL (ref 0.450–4.500)

## 2016-01-17 LAB — VITAMIN B12: VITAMIN B 12: 1088 pg/mL — AB (ref 211–946)

## 2016-01-17 LAB — SEDIMENTATION RATE: Sed Rate: 2 mm/hr (ref 0–40)

## 2016-01-17 NOTE — Telephone Encounter (Signed)
-----   Message from Margarita Rana, MD sent at 01/17/2016  9:07 AM EDT ----- Labs stable. Please notify patient. Proceed with referrals. Thanks.

## 2016-01-17 NOTE — Telephone Encounter (Signed)
Pt advised as directed below.  She states she will be filing for short term disability.  She also started her medication last night and had a good night.  But is in more pain today so she will probably doubling up on it tonight.   Thanks,   -Mickel Baas

## 2016-02-27 ENCOUNTER — Other Ambulatory Visit: Payer: Self-pay | Admitting: Neurology

## 2016-02-27 DIAGNOSIS — G501 Atypical facial pain: Secondary | ICD-10-CM

## 2016-03-20 ENCOUNTER — Ambulatory Visit
Admission: RE | Admit: 2016-03-20 | Discharge: 2016-03-20 | Disposition: A | Discharge status: Home / Self Care | Payer: 59 | Source: Ambulatory Visit | Attending: Neurology | Admitting: Neurology

## 2016-03-20 DIAGNOSIS — G501 Atypical facial pain: Secondary | ICD-10-CM | POA: Insufficient documentation

## 2016-03-20 DIAGNOSIS — R202 Paresthesia of skin: Secondary | ICD-10-CM | POA: Diagnosis present

## 2016-03-20 MED ORDER — GADOBENATE DIMEGLUMINE 529 MG/ML IV SOLN
20.0000 mL | Freq: Once | INTRAVENOUS | Status: AC | PRN
  Administered 2016-03-20: 20 mL via INTRAVENOUS

## 2016-05-17 IMAGING — CT CT HEAD W/O CM
1 series · 16 of 29 positions shown, 20 images · non-contrast
Comparison: Brain MRI, 01/24/2009.

CLINICAL DATA: Patient fell [DATE] and had foot on bathroom sink
painting toenails. She states she lost her balance and fell back and
hit back of head. Left side H/A's. Hx Cervical CA with hysterectomy.
No hx CVA, brain anuerysm or seizures.

EXAM:
CT HEAD WITHOUT CONTRAST
TECHNIQUE: Contiguous axial images were obtained from the base of the skull
through the vertex without intravenous contrast.

[Series 2: head wo · axial · 0.39mm/px · z∈[-51,+79]mm · 16 of 29 slices shown, 20 images]
[im 2/29  brain]
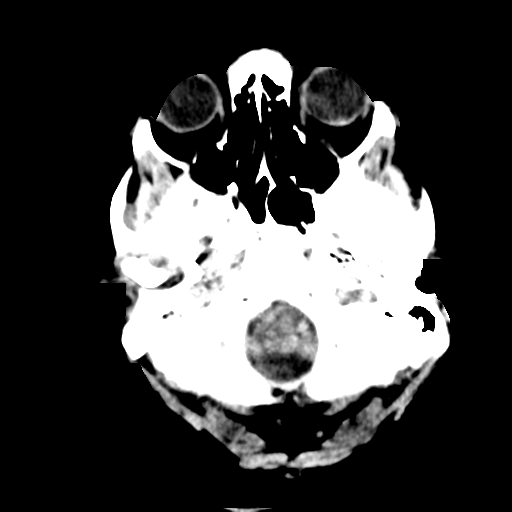
[im 2/29  bone]
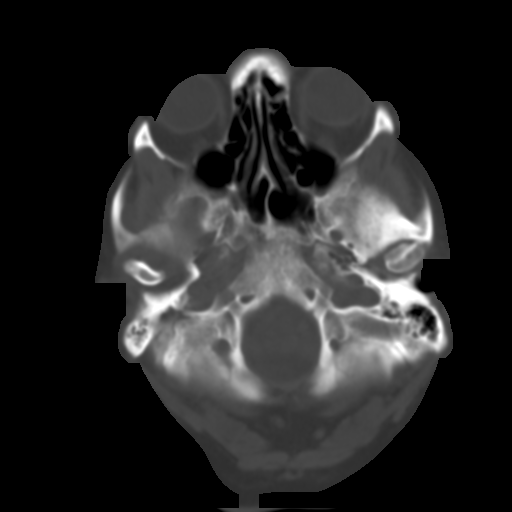
[im 4/29  brain]
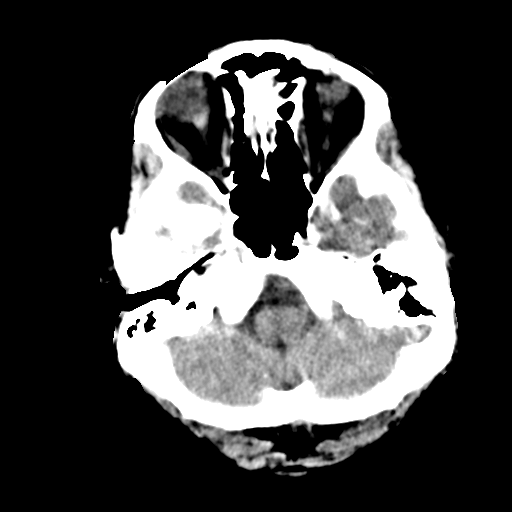
[im 6/29  brain]
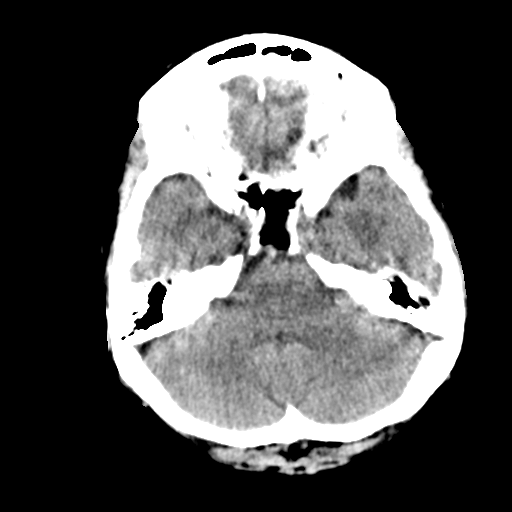
[im 7/29  brain]
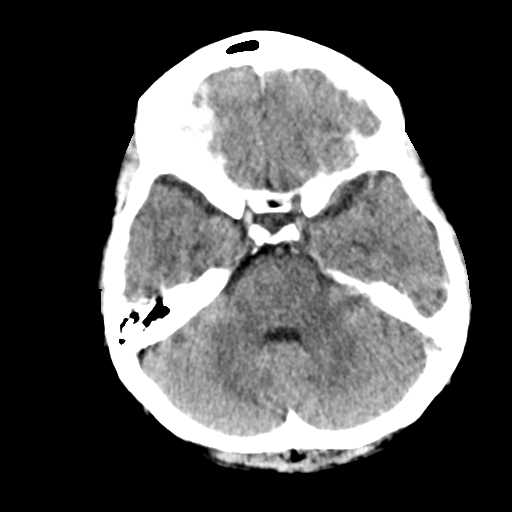
[im 9/29  brain]
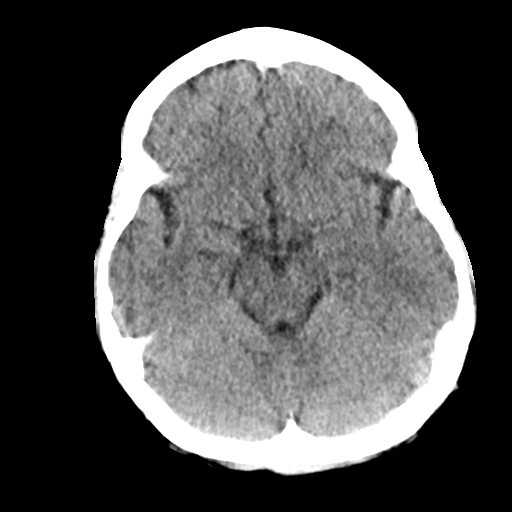
[im 9/29  bone]
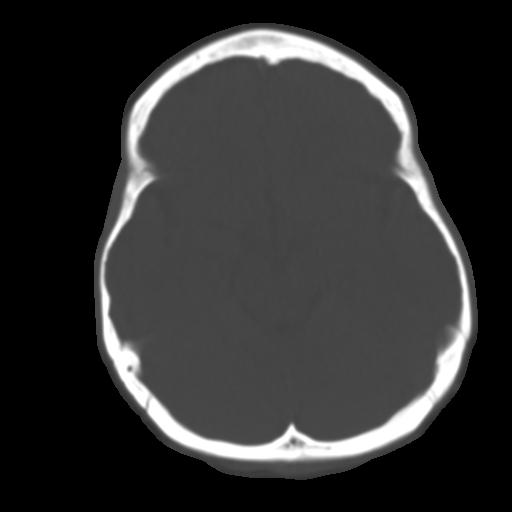
[im 11/29  brain]
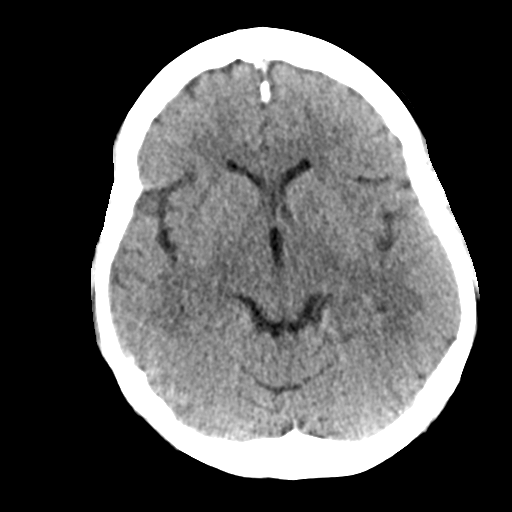
[im 12/29  brain]
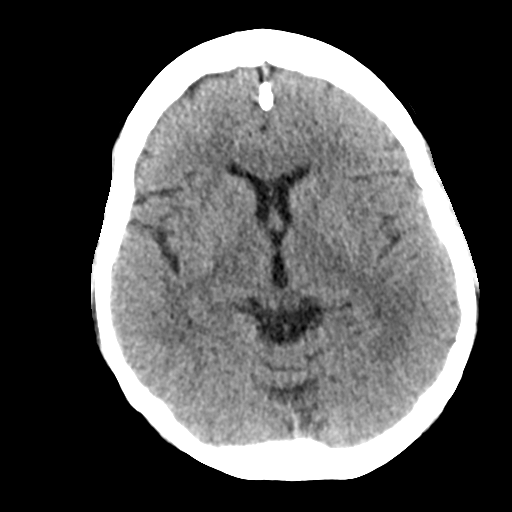
[im 14/29  brain]
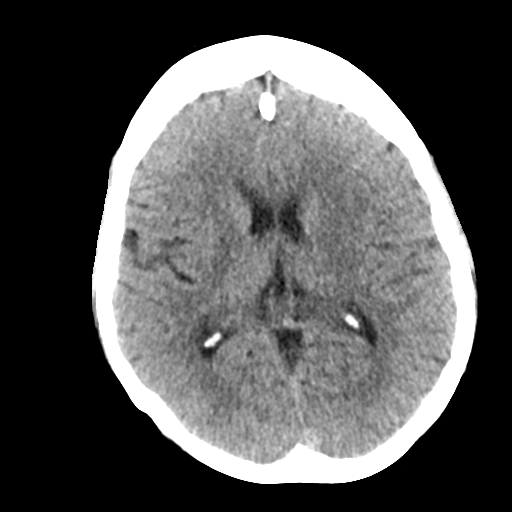
[im 16/29  brain]
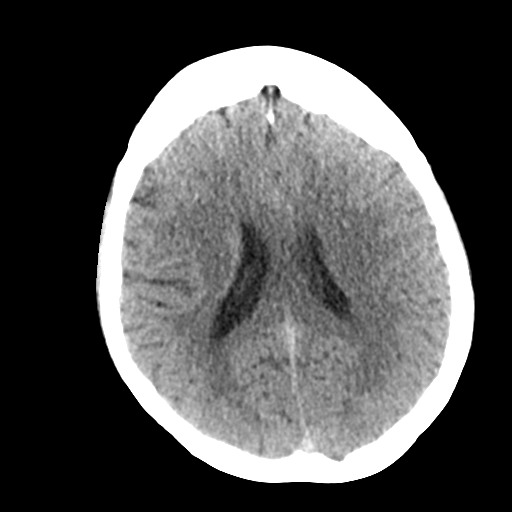
[im 16/29  bone]
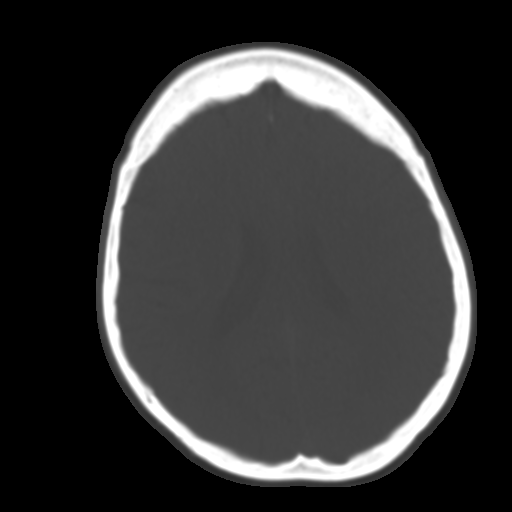
[im 18/29  brain]
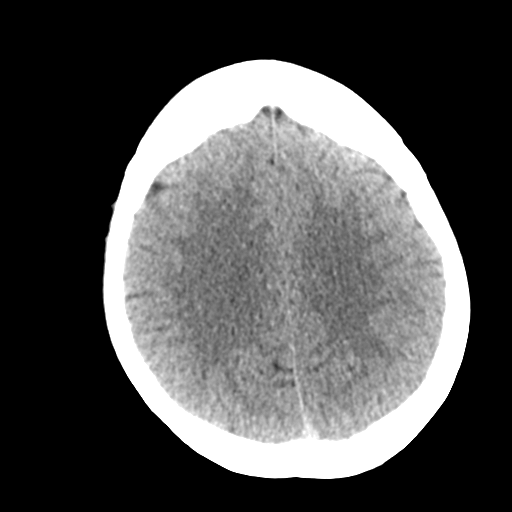
[im 19/29  brain]
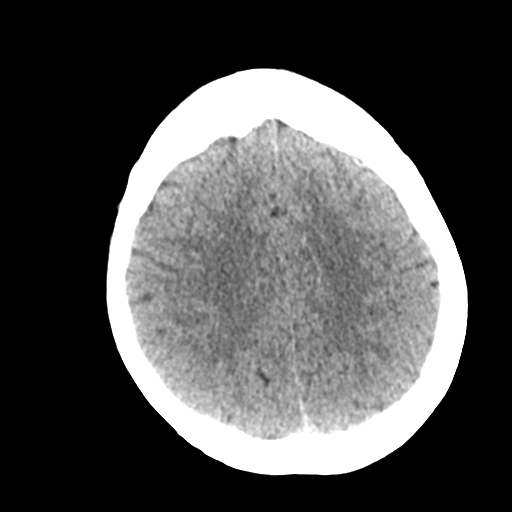
[im 21/29  brain]
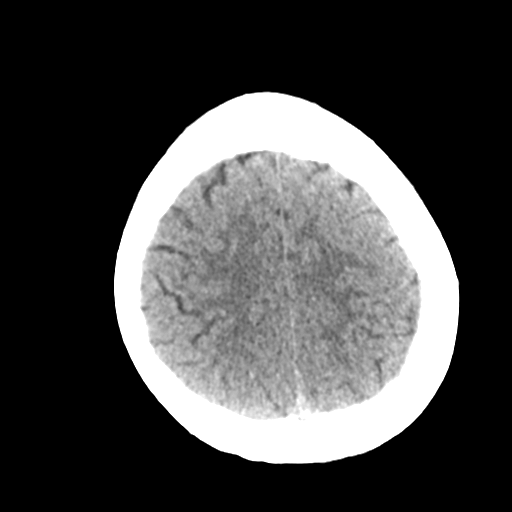
[im 23/29  brain]
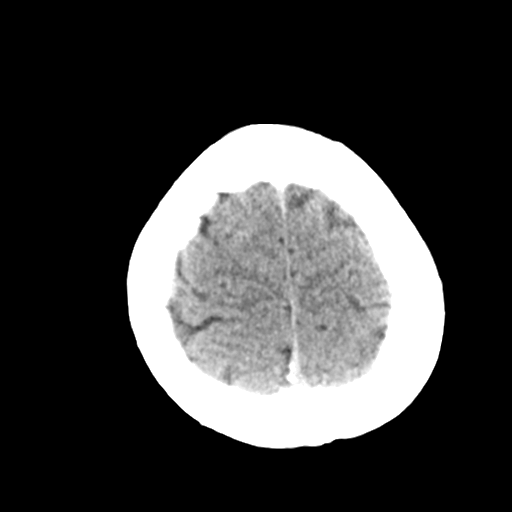
[im 23/29  bone]
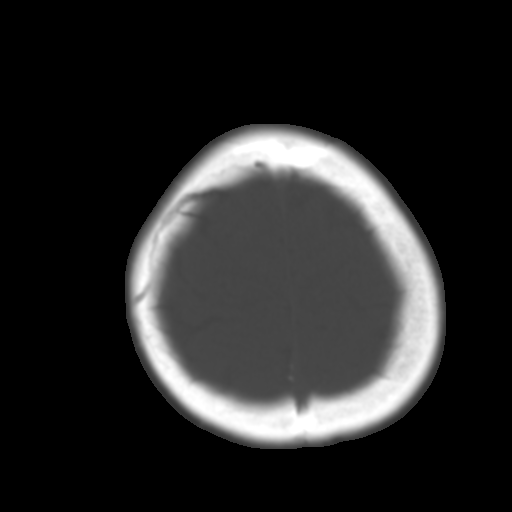
[im 24/29  brain]
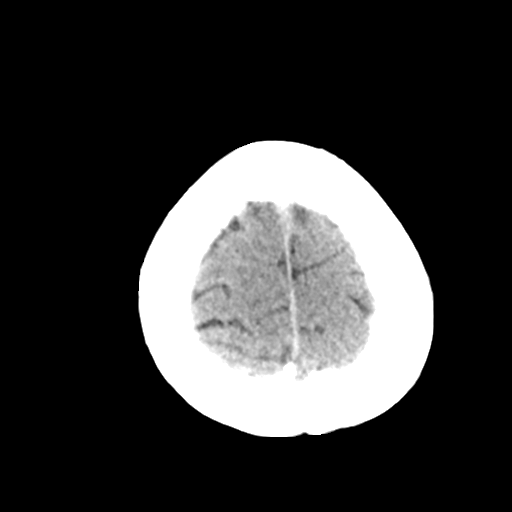
[im 26/29  brain]
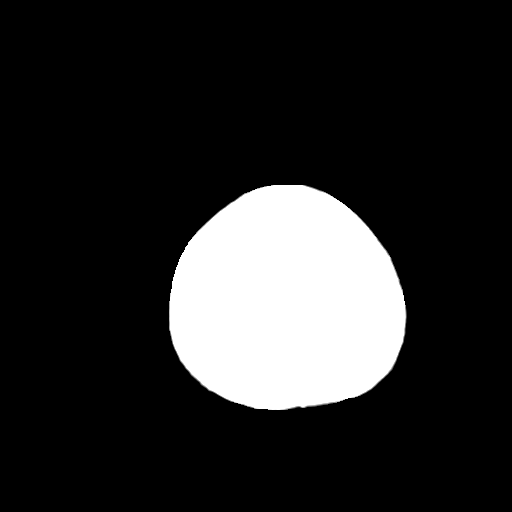
[im 28/29  brain]
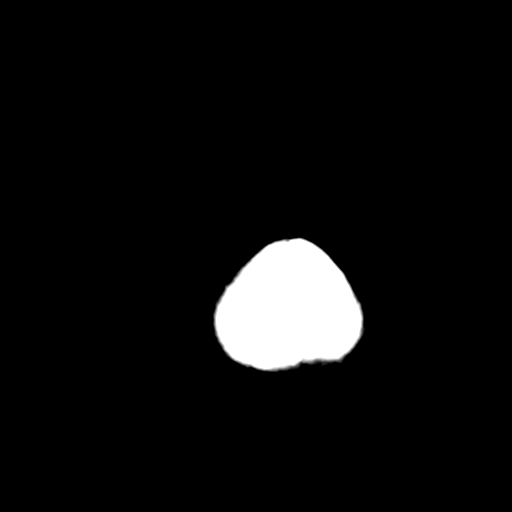

[16 of 29 positions shown; findings below may reference images not displayed]

FINDINGS: Ventricles normal in size and configuration. There are no
parenchymal masses or mass effect. There is no evidence of a
cortical infarct. There are no extra-axial masses or abnormal fluid
collections.

There is no intracranial hemorrhage.

Visualized sinuses and mastoid air cells are clear. No skull
fracture.
IMPRESSION: Normal unenhanced CT scan the brain.

## 2016-08-06 LAB — HM PAP SMEAR

## 2016-09-20 DIAGNOSIS — L728 Other follicular cysts of the skin and subcutaneous tissue: Secondary | ICD-10-CM | POA: Diagnosis not present

## 2016-09-20 DIAGNOSIS — L820 Inflamed seborrheic keratosis: Secondary | ICD-10-CM | POA: Diagnosis not present

## 2016-09-20 DIAGNOSIS — D229 Melanocytic nevi, unspecified: Secondary | ICD-10-CM | POA: Diagnosis not present

## 2016-11-25 DIAGNOSIS — J069 Acute upper respiratory infection, unspecified: Secondary | ICD-10-CM | POA: Diagnosis not present

## 2016-12-10 DIAGNOSIS — G4733 Obstructive sleep apnea (adult) (pediatric): Secondary | ICD-10-CM | POA: Diagnosis not present

## 2016-12-10 DIAGNOSIS — Z79899 Other long term (current) drug therapy: Secondary | ICD-10-CM | POA: Diagnosis not present

## 2016-12-10 DIAGNOSIS — G501 Atypical facial pain: Secondary | ICD-10-CM | POA: Diagnosis not present

## 2017-02-21 ENCOUNTER — Telehealth: Payer: Self-pay | Admitting: Family Medicine

## 2017-02-21 NOTE — Telephone Encounter (Signed)
Patient signed a consent for [facility] to release any records to [facility] signed by pt 01-17-16

## 2017-05-21 DIAGNOSIS — R102 Pelvic and perineal pain: Secondary | ICD-10-CM | POA: Diagnosis not present

## 2017-06-25 IMAGING — MR MR HEAD WO/W CM
4 of 8 series · 19 of 48 positions shown · IV contrast (multihance)
Comparison: Head CT 02/10/2015, neck CT 09/01/2013, and brain MRI
01/24/2009

CLINICAL DATA: Atypical left-sided facial pain, including for the
last 3 weeks. Two episodes in the last 12 months.

EXAM:
MRI HEAD WITHOUT AND WITH CONTRAST
TECHNIQUE: Multiplanar, multiecho pulse sequences of the brain and surrounding
structures were obtained without and with intravenous contrast.
CONTRAST:  20mL MULTIHANCE GADOBENATE DIMEGLUMINE 529 MG/ML IV SOLN

[Series 2: T2 · coronal · 3.0mm · 0.35mm/px · 6 of 34 slices shown]
[im 1/34]
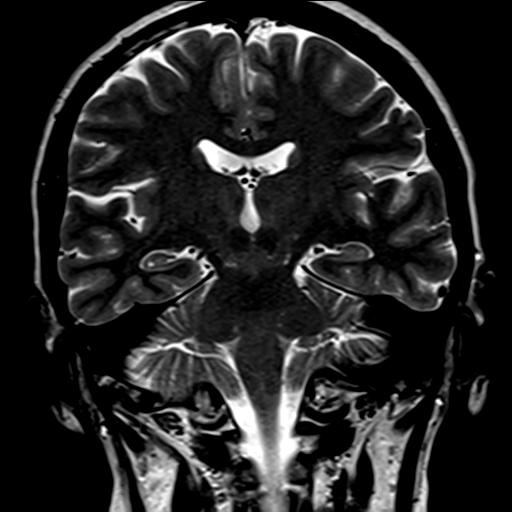
[im 7/34]
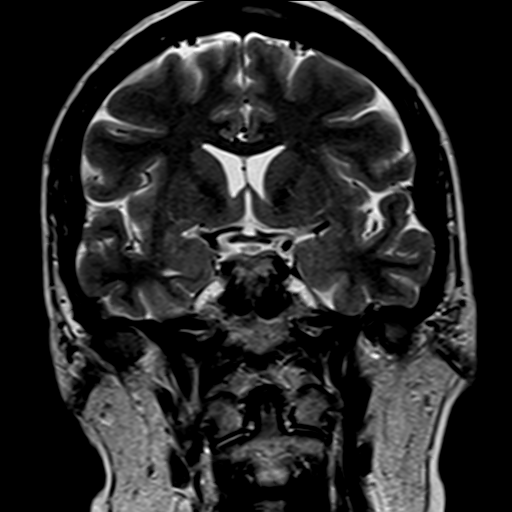
[im 14/34]
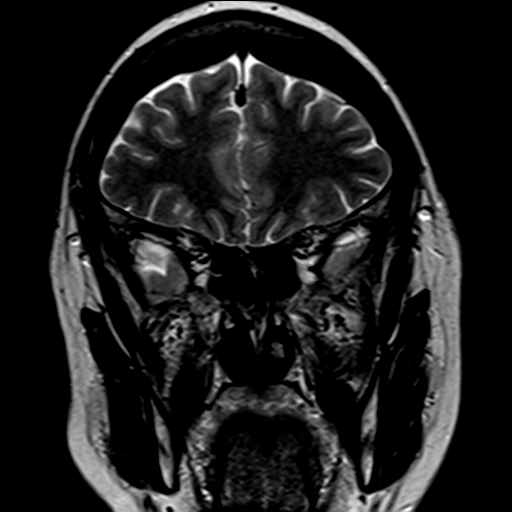
[im 20/34]
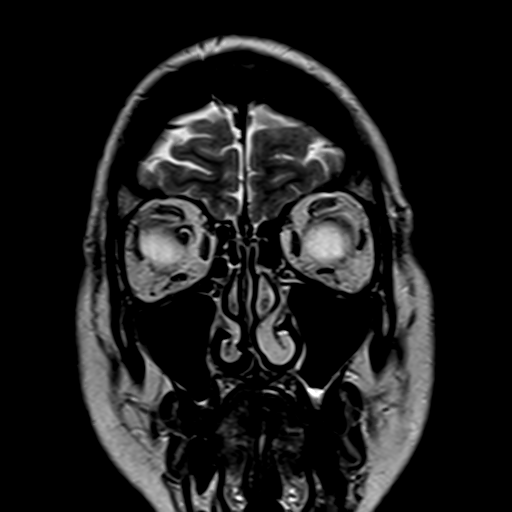
[im 27/34]
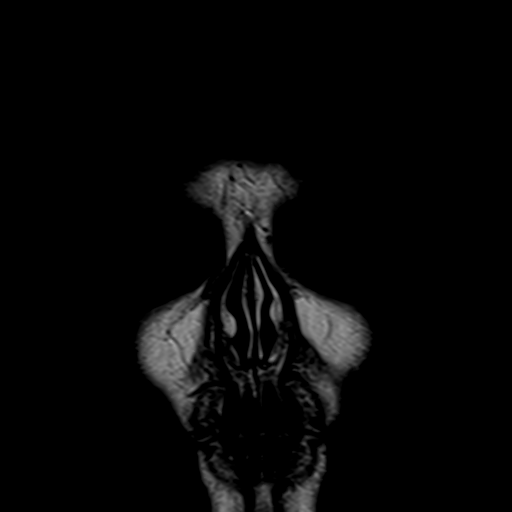
[im 34/34]
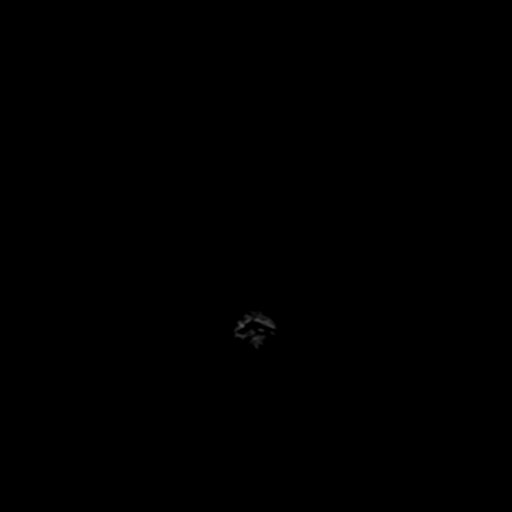

[Series 5: T1 · axial · 3.0mm · 0.35mm/px · z∈[-116,+5]mm · 5 of 34 slices shown (1 of 3)]
[im 1/34]
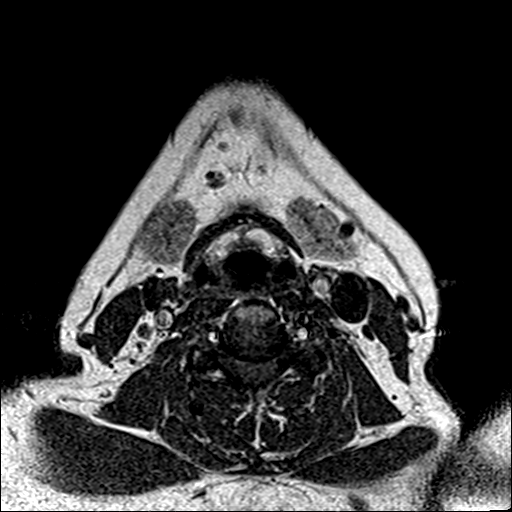
[im 9/34]
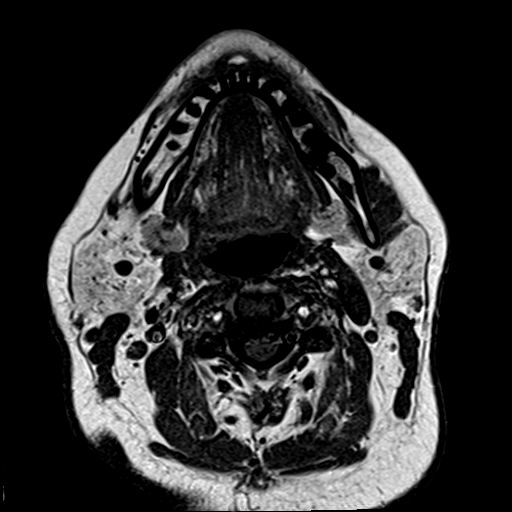
[im 17/34]
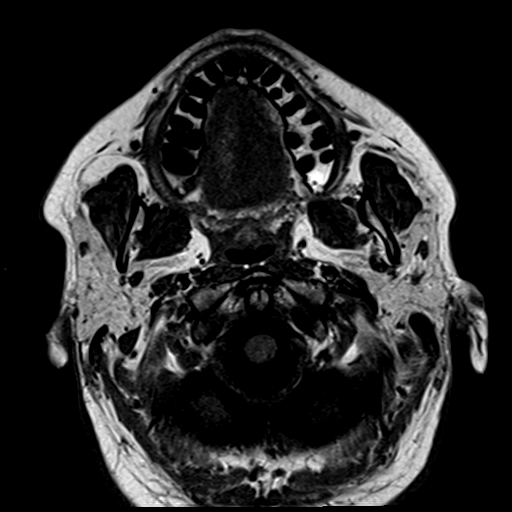
[im 25/34]
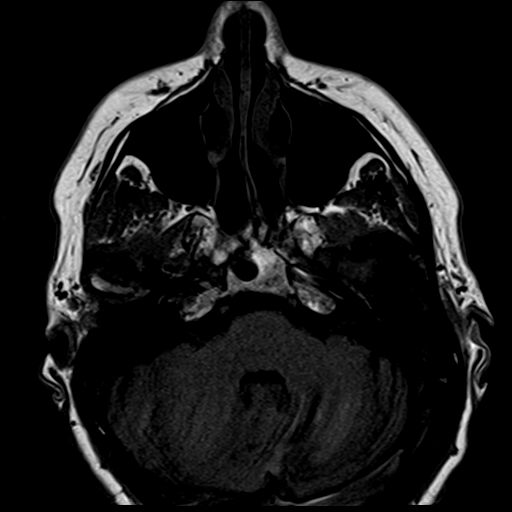
[im 34/34]
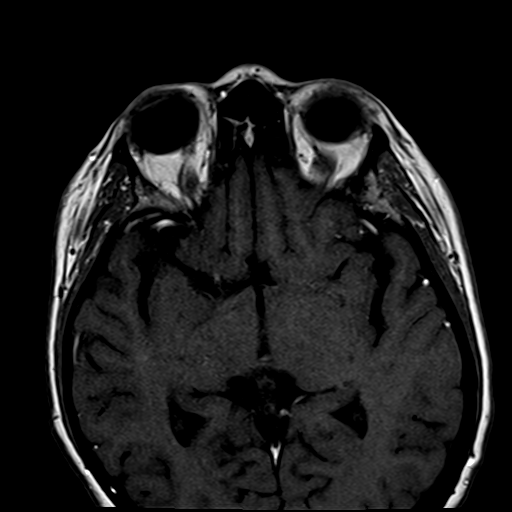

[Series 6: T1 · coronal · 3.0mm · 0.35mm/px · 5 of 35 slices shown (2 of 3)]
[im 1/35]
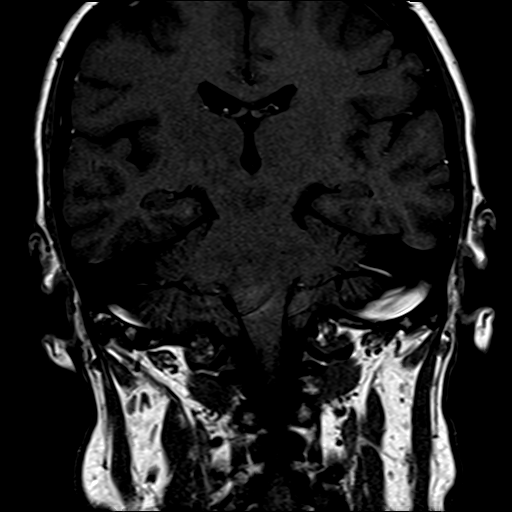
[im 9/35]
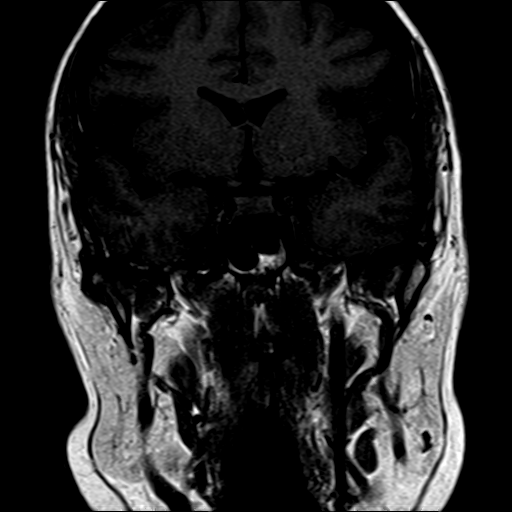
[im 18/35]
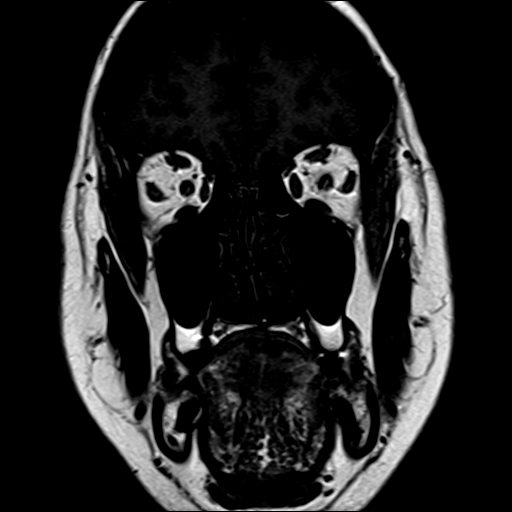
[im 26/35]
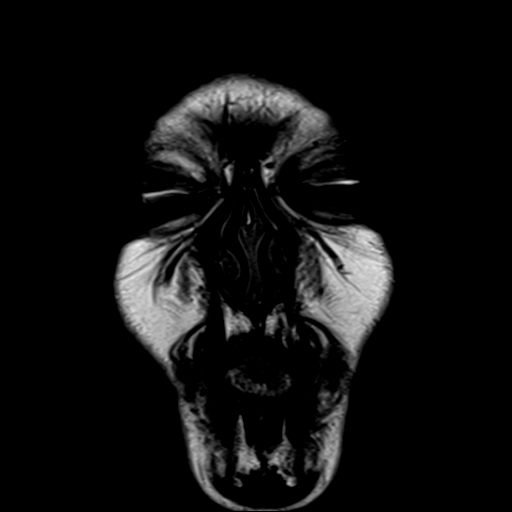
[im 35/35]
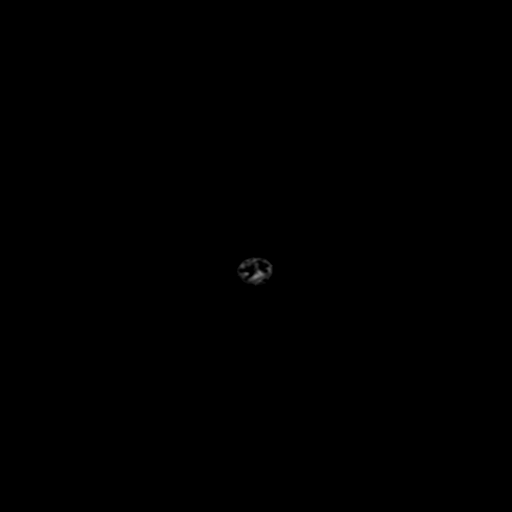

[Series 7: T1 · sagittal · 3.0mm · 0.35mm/px · 3 of 35 slices shown (3 of 3)]
[im 1/35]
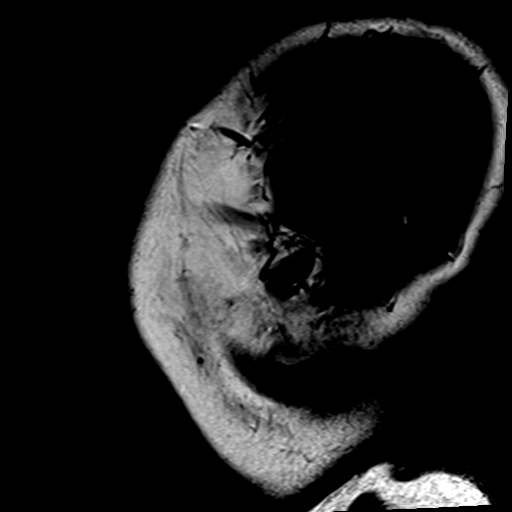
[im 18/35]
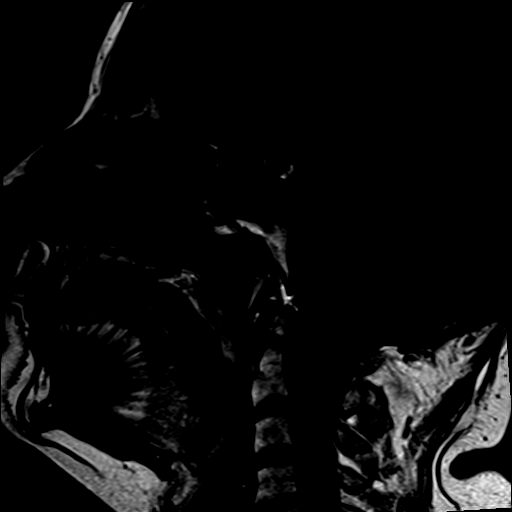
[im 35/35]
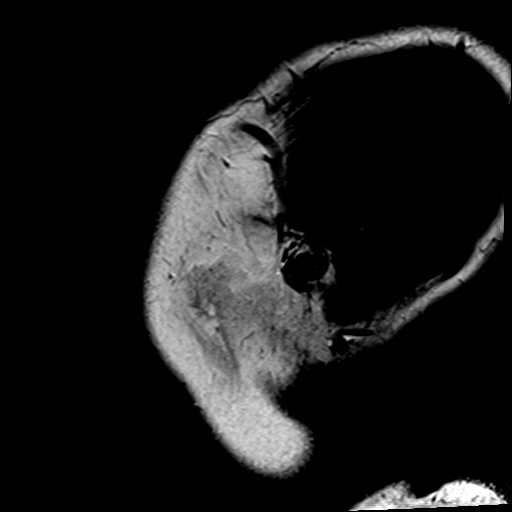

[19 of 48 positions shown; findings below may reference images not displayed]

FINDINGS: Trigeminal protocol MRI was performed. The cisternal segments of the
trigeminal nerves are normal in appearance bilaterally. There is a
small vessel which crosses inferior to the cisternal segment of the
left trigeminal nerve which appears to represent a vein and does not
result in gross mass effect on the nerve. No mass or abnormal
enhancement is identified along the course of the trigeminal nerves.
Meckel's caves and cavernous sinuses are normal in appearance
bilaterally. The postcontrast sequences were inadvertently performed
without fat saturation which limits assessment of the soft tissues
below the skull base and in the face. No mass is identified.

Limited assessment of the included portion of the brain is
unremarkable. The orbits are unremarkable. There is trace left
maxillary sinus mucosal thickening. The mastoid air cells are clear.
Major vascular flow voids at the base of the brain are preserved.
IMPRESSION: No etiology of trigeminal neuralgia/ facial pain identified.

## 2017-07-01 DIAGNOSIS — L0291 Cutaneous abscess, unspecified: Secondary | ICD-10-CM | POA: Diagnosis not present

## 2017-07-01 DIAGNOSIS — R203 Hyperesthesia: Secondary | ICD-10-CM | POA: Diagnosis not present

## 2017-07-01 DIAGNOSIS — L720 Epidermal cyst: Secondary | ICD-10-CM | POA: Diagnosis not present

## 2017-08-01 ENCOUNTER — Ambulatory Visit (INDEPENDENT_AMBULATORY_CARE_PROVIDER_SITE_OTHER): Payer: 59 | Admitting: Family Medicine

## 2017-08-01 ENCOUNTER — Encounter: Payer: Self-pay | Admitting: Family Medicine

## 2017-08-01 VITALS — BP 124/70 | Temp 98.2°F | Resp 16 | Ht 67.0 in | Wt 197.0 lb

## 2017-08-01 DIAGNOSIS — Z1211 Encounter for screening for malignant neoplasm of colon: Secondary | ICD-10-CM

## 2017-08-01 DIAGNOSIS — Z1159 Encounter for screening for other viral diseases: Secondary | ICD-10-CM

## 2017-08-01 DIAGNOSIS — E039 Hypothyroidism, unspecified: Secondary | ICD-10-CM | POA: Insufficient documentation

## 2017-08-01 DIAGNOSIS — Z23 Encounter for immunization: Secondary | ICD-10-CM

## 2017-08-01 DIAGNOSIS — Z6830 Body mass index (BMI) 30.0-30.9, adult: Secondary | ICD-10-CM | POA: Diagnosis not present

## 2017-08-01 DIAGNOSIS — G500 Trigeminal neuralgia: Secondary | ICD-10-CM | POA: Diagnosis not present

## 2017-08-01 DIAGNOSIS — E119 Type 2 diabetes mellitus without complications: Secondary | ICD-10-CM

## 2017-08-01 DIAGNOSIS — E781 Pure hyperglyceridemia: Secondary | ICD-10-CM | POA: Diagnosis not present

## 2017-08-01 DIAGNOSIS — K7581 Nonalcoholic steatohepatitis (NASH): Secondary | ICD-10-CM | POA: Diagnosis not present

## 2017-08-01 DIAGNOSIS — E669 Obesity, unspecified: Secondary | ICD-10-CM | POA: Diagnosis not present

## 2017-08-01 LAB — POCT UA - MICROALBUMIN: MICROALBUMIN (UR) POC: 0 mg/L

## 2017-08-01 MED ORDER — CARBAMAZEPINE ER 100 MG PO TB12: 100.0000 mg | ORAL_TABLET | Freq: Every day | ORAL | 1 refills | Status: DC

## 2017-08-01 NOTE — Assessment & Plan Note (Signed)
Previously with mild elevated ALT Needs annual LFTs checked, especially as she is on Tegretol

## 2017-08-01 NOTE — Assessment & Plan Note (Signed)
Previously well-controlled with diet Patient is on a very low carb diet at this point in losing substantial weight, which I congratulated her for Recheck A1c today Foot exam today Urine microalbumin normal today Pneumococcal vaccine given today Eye exam scheduled for next month and results will be sent to [place] Follow-up in 6 months

## 2017-08-01 NOTE — Progress Notes (Signed)
Patient: Veronica Butler Female    DOB: September 04, 1961   56 y.o.   MRN: [id number] Visit Date: 08/01/2017  Today's Provider: Lavon Paganini, MD   Chief Complaint  Patient presents with  . Diabetes  . Hypothyroidism  . Trigeminal Neuralgia   Subjective:    HPI  Patient comes in today to establish care. She was previously a patient of Dr. Venia Minks.   Diabetes Patient was last seen 1 year ago. She reports that she does not currently take anything for diabetes. She has lost over 38lbs since she was last seen in the office.  She is eating keto diet that is very low carb. She denies polyuria, polydipsia,foot ulcers or numbness    Hypothyroidism Patient was last seen over 1 year ago. She was referred to endocrinology for management. She reports that she was stable and that she could be managed by her PCP. She is currently taking Synthorid 26mg daily except 512m daily on weekends.  Denies hair/skin changes  Obesity - ketogenic diet through 20/30 plan - has lost 44 lbs since 02/2017 - thinks that blood sugar has gotten a lot better   Trigeminal Neuralgia Tegretol 10076maily Released from neurology Told that she needs to have LFTs monitored  Allergies  Allergen Reactions  . Cortisone Other (See Comments)    High blood sugar  . Tape Swelling and Dermatitis    Redness and itching.  . Ciprofloxacin Rash  . Floxacillin [Flucloxacillin] Rash  . Levaquin [Levofloxacin In D5w] Rash     Current Outpatient Medications:  .  Blood Glucose Monitoring Suppl (ONE TOUCH ULTRA SYSTEM KIT) W/DEVICE KIT, GLUCOMETER TEST STRIPS - Historical Medication  Check blood sugar daily ---- ULTRA ONE TOUCH  Started 11-Jul-2009 Active Comments: DX: 790.29, Disp: , Rfl:  .  Cranberry 500 MG CAPS, Take 1 capsule by mouth daily., Disp: , Rfl:  .  DIGESTIVE ENZYMES PO, Take 1 capsule by mouth daily., Disp: , Rfl:  .  Magnesium 500 MG CAPS, Take 2 tablets by mouth daily. , Disp: , Rfl:  .  Misc Natural  Products (ADRENAL) 200 MG CAPS, Take 1 capsule by mouth 2 (two) times daily. , Disp: , Rfl:  .  MULTIPLE VITAMIN PO, Take 3 tablets by mouth daily. , Disp: , Rfl:  .  Probiotic Product (PROBIOTIC ADVANCED) CAPS, Take 1 capsule by mouth daily., Disp: , Rfl:  .  vitamin B-12 (CYANOCOBALAMIN) 1000 MCG tablet, Take 1 tablet by mouth. , Disp: , Rfl:  .  carbamazepine (TEGRETOL XR) 100 MG 12 hr tablet, Take 1 tablet (100 mg total) daily by mouth., Disp: 90 tablet, Rfl: 1 .  SYNTHROID 25 MCG tablet, Take 25 mcg by mouth daily before breakfast. 50 mcg on the weekends, Disp: , Rfl:   Review of Systems  Constitutional: Negative.   HENT: Negative.   Eyes: Negative.   Respiratory: Positive for apnea.        Has sleep apnea  Cardiovascular: Negative.   Gastrointestinal: Negative.   Endocrine: Negative.   Genitourinary: Negative.   Musculoskeletal: Negative.   Skin: Negative.   Allergic/Immunologic: Negative.   Neurological: Negative.   Hematological: Negative.   Psychiatric/Behavioral: Negative.     Social History   Tobacco Use  . Smoking status: Never Smoker  . Smokeless tobacco: Never Used  Substance Use Topics  . Alcohol use: No   Objective:   BP 124/70 (BP Location: Left Arm, Patient Position: Sitting, Cuff Size: Normal)   Temp  98.2 F (36.8 C)   Resp 16   Ht 5' 7"  (1.702 m)   Wt 197 lb (89.4 kg)   LMP 11/05/2000   BMI 30.85 kg/m  Vitals:   08/01/17 1508  BP: 124/70  Resp: 16  Temp: 98.2 F (36.8 C)  Weight: 197 lb (89.4 kg)  Height: 5' 7"  (1.702 m)     Physical Exam  Constitutional: She is oriented to person, place, and time. She appears well-developed and well-nourished. No distress.  HENT:  Head: Normocephalic and atraumatic.  Right Ear: External ear normal.  Left Ear: External ear normal.  Nose: Nose normal.  Mouth/Throat: Oropharynx is clear and moist.  Eyes: Conjunctivae are normal. Pupils are equal, round, and reactive to light. No scleral icterus.  Neck:  Neck supple. No thyromegaly present.  Cardiovascular: Normal rate, regular rhythm, normal heart sounds and intact distal pulses.  No murmur heard. Pulmonary/Chest: Breath sounds normal. No respiratory distress. She has no wheezes. She has no rales.  Abdominal: Soft. Bowel sounds are normal. She exhibits no distension. There is no tenderness. There is no rebound and no guarding.  Musculoskeletal: She exhibits no edema or deformity.  Lymphadenopathy:    She has no cervical adenopathy.  Neurological: She is alert and oriented to person, place, and time.  Skin: Skin is warm and dry. No rash noted.  Psychiatric: She has a normal mood and affect. Her behavior is normal.  Vitals reviewed.       Assessment & Plan:      Problem List Items Addressed This Visit      Digestive   NASH (nonalcoholic steatohepatitis)    Previously with mild elevated ALT Needs annual LFTs checked, especially as she is on Tegretol      Relevant Orders   Comprehensive metabolic panel     Endocrine   Diabetes ([place]) - Primary    Previously well-controlled with diet Patient is on a very low carb diet at this point in losing substantial weight, which I congratulated her for Recheck A1c today Foot exam today Urine microalbumin normal today Pneumococcal vaccine given today Eye exam scheduled for next month and results will be sent to [place] Follow-up in 6 months      Relevant Orders   Hemoglobin A1c   POCT UA - Microalbumin (Completed)   Hypothyroidism    Continue Synthroid at current dose No longer followed by endocrinology Asymptomatic Recheck TSH today Per endocrinology, no need for further thyroid ultrasounds to monitor nodules      Relevant Orders   TSH     Nervous and Auditory   Trigeminal neuralgia    Continue Tegretol at current low dose Well-controlled on this No longer followed by neology Check LFTs as above      Relevant Medications   carbamazepine (TEGRETOL XR) 100 MG 12 hr tablet       Other   Hypertriglyceridemia    Not currently taking any medications for this Recheck lipid      Relevant Orders   Lipid panel   Comprehensive metabolic panel    Other Visit Diagnoses    Screening for colon cancer       Relevant Orders   Ambulatory referral to Gastroenterology   Class 1 obesity without serious comorbidity with body mass index (BMI) of 30.0 to 30.9 in adult, unspecified obesity type       Relevant Orders   CBC   Need for hepatitis C screening test       Relevant Orders  Hepatitis C Antibody   Influenza vaccine needed       Relevant Orders   Flu Vaccine QUAD 6+ mos PF IM (Fluarix Quad PF) (Completed)   Need for pneumococcal vaccination       Relevant Orders   Pneumococcal polysaccharide vaccine 23-valent greater than or equal to 2yo subcutaneous/IM (Completed)   Trigeminal neuralgia of left side of face       Relevant Medications   carbamazepine (TEGRETOL XR) 100 MG 12 hr tablet      Return in about 6 months (around 01/29/2018) for thyroid, diabetes f/u.      The entirety of the information documented in the History of Present Illness, Review of Systems and Physical Exam were personally obtained by me. Portions of this information were initially documented by Wilburt Finlay, CMA and reviewed by me for thoroughness and accuracy.     Lavon Paganini, MD  [facility]

## 2017-08-01 NOTE — Assessment & Plan Note (Signed)
Continue Synthroid at current dose No longer followed by endocrinology Asymptomatic Recheck TSH today Per endocrinology, no need for further thyroid ultrasounds to monitor nodules

## 2017-08-01 NOTE — Patient Instructions (Signed)
Colonoscopy, Adult A colonoscopy is an exam to look at the entire large intestine. During the exam, a lubricated, bendable tube is inserted into the anus and then passed into the rectum, colon, and other parts of the large intestine. A colonoscopy is often done as a part of normal colorectal screening or in response to certain symptoms, such as anemia, persistent diarrhea, abdominal pain, and blood in the stool. The exam can help screen for and diagnose medical problems, including:  Tumors.  Polyps.  Inflammation.  Areas of bleeding.  Tell a health care provider about:  Any allergies you have.  All medicines you are taking, including vitamins, herbs, eye drops, creams, and over-the-counter medicines.  Any problems you or family members have had with anesthetic medicines.  Any blood disorders you have.  Any surgeries you have had.  Any medical conditions you have.  Any problems you have had passing stool. What are the risks? Generally, this is a safe procedure. However, problems may occur, including:  Bleeding.  A tear in the intestine.  A reaction to medicines given during the exam.  Infection (rare).  What happens before the procedure? Eating and drinking restrictions Follow instructions from your health care provider about eating and drinking, which may include:  A few days before the procedure - follow a low-fiber diet. Avoid nuts, seeds, dried fruit, raw fruits, and vegetables.  1-3 days before the procedure - follow a clear liquid diet. Drink only clear liquids, such as clear broth or bouillon, black coffee or tea, clear juice, clear soft drinks or sports drinks, gelatin dessert, and popsicles. Avoid any liquids that contain red or purple dye.  On the day of the procedure - do not eat or drink anything during the 2 hours before the procedure, or within the time period that your health care provider recommends.  Bowel prep If you were prescribed an oral bowel prep  to clean out your colon:  Take it as told by your health care provider. Starting the day before your procedure, you will need to drink a large amount of medicated liquid. The liquid will cause you to have multiple loose stools until your stool is almost clear or light green.  If your skin or anus gets irritated from diarrhea, you may use these to relieve the irritation: ? Medicated wipes, such as adult wet wipes with aloe and vitamin E. ? A skin soothing-product like petroleum jelly.  If you vomit while drinking the bowel prep, take a break for up to 60 minutes and then begin the bowel prep again. If vomiting continues and you cannot take the bowel prep without vomiting, call your health care provider.  General instructions  Ask your health care provider about changing or stopping your regular medicines. This is especially important if you are taking diabetes medicines or blood thinners.  Plan to have someone take you home from the hospital or clinic. What happens during the procedure?  An IV tube may be inserted into one of your veins.  You will be given medicine to help you relax (sedative).  To reduce your risk of infection: ? Your health care team will wash or sanitize their hands. ? Your anal area will be washed with soap.  You will be asked to lie on your side with your knees bent.  Your health care provider will lubricate a long, thin, flexible tube. The tube will have a camera and a light on the end.  The tube will be inserted into your   anus.  The tube will be gently eased through your rectum and colon.  Air will be delivered into your colon to keep it open. You may feel some pressure or cramping.  The camera will be used to take images during the procedure.  A small tissue sample may be removed from your body to be examined under a microscope (biopsy). If any potential problems are found, the tissue will be sent to a lab for testing.  If small polyps are found, your  health care provider may remove them and have them checked for cancer cells.  The tube that was inserted into your anus will be slowly removed. The procedure may vary among health care providers and hospitals. What happens after the procedure?  Your blood pressure, heart rate, breathing rate, and blood oxygen level will be monitored until the medicines you were given have worn off.  Do not drive for 24 hours after the exam.  You may have a small amount of blood in your stool.  You may pass gas and have mild abdominal cramping or bloating due to the air that was used to inflate your colon during the exam.  It is up to you to get the results of your procedure. Ask your health care provider, or the department performing the procedure, when your results will be ready. This information is not intended to replace advice given to you by your health care provider. Make sure you discuss any questions you have with your health care provider. Document Released: 08/30/2000 Document Revised: 07/03/2016 Document Reviewed: 11/14/2015 Elsevier Interactive Patient Education  2018 Elsevier Inc[employer]  

## 2017-08-01 NOTE — Assessment & Plan Note (Signed)
Not currently taking any medications for this Recheck lipid

## 2017-08-01 NOTE — Assessment & Plan Note (Signed)
Continue Tegretol at current low dose Well-controlled on this No longer followed by neology Check LFTs as above

## 2017-08-05 LAB — COMPLETE METABOLIC PANEL WITH GFR
AG RATIO: 2.2 (calc) (ref 1.0–2.5)
ALT: 19 U/L (ref 6–29)
AST: 19 U/L (ref 10–35)
Albumin: 4.6 g/dL (ref 3.6–5.1)
Alkaline phosphatase (APISO): 88 U/L (ref 33–130)
BILIRUBIN TOTAL: 0.5 mg/dL (ref 0.2–1.2)
BUN: 16 mg/dL (ref 7–25)
CALCIUM: 9.3 mg/dL (ref 8.6–10.4)
CO2: 28 mmol/L (ref 20–32)
Chloride: 104 mmol/L (ref 98–110)
Creat: 0.71 mg/dL (ref 0.50–1.05)
GFR, EST AFRICAN AMERICAN: 109 mL/min/{1.73_m2} (ref >=60)
GFR, Est Non African American: 94 mL/min/{1.73_m2} (ref >=60)
GLUCOSE: 113 mg/dL — AB (ref 65–99)
Globulin: 2.1 g/dL (calc) (ref 1.9–3.7)
POTASSIUM: 4.5 mmol/L (ref 3.5–5.3)
Sodium: 140 mmol/L (ref 135–146)
TOTAL PROTEIN: 6.7 g/dL (ref 6.1–8.1)

## 2017-08-05 LAB — LIPID PANEL
CHOL/HDL RATIO: 4.2 (calc) (ref <5.0)
Cholesterol: 188 mg/dL (ref <200)
HDL: 45 mg/dL — AB (ref >50)
LDL Cholesterol (Calc): 118 mg/dL (calc) — ABNORMAL HIGH
NON-HDL CHOLESTEROL (CALC): 143 mg/dL — AB (ref <130)
Triglycerides: 132 mg/dL (ref <150)

## 2017-08-05 LAB — CBC
HCT: 42 % (ref 35.0–45.0)
Hemoglobin: 14.2 g/dL (ref 11.7–15.5)
MCH: 30.2 pg (ref 27.0–33.0)
MCHC: 33.8 g/dL (ref 32.0–36.0)
MCV: 89.4 fL (ref 80.0–100.0)
MPV: 9.8 fL (ref 7.5–12.5)
PLATELETS: 251 10*3/uL (ref 140–400)
RBC: 4.7 10*6/uL (ref 3.80–5.10)
RDW: 11.9 % (ref 11.0–15.0)
WBC: 9.3 10*3/uL (ref 3.8–10.8)

## 2017-08-05 LAB — HEMOGLOBIN A1C
EAG (MMOL/L): 6.3 (calc)
Hgb A1c MFr Bld: 5.6 % of total Hgb (ref <5.7)
MEAN PLASMA GLUCOSE: 114 (calc)

## 2017-08-05 LAB — TSH: TSH: 1.79 m[IU]/L (ref 0.40–4.50)

## 2017-08-05 LAB — HEPATITIS C ANTIBODY
HEP C AB: NONREACTIVE
SIGNAL TO CUT-OFF: 0 (ref <1.00)

## 2017-08-06 ENCOUNTER — Telehealth: Payer: Self-pay

## 2017-08-06 ENCOUNTER — Telehealth: Payer: Self-pay | Admitting: Family Medicine

## 2017-08-06 NOTE — Telephone Encounter (Signed)
Patient advised of normal labs.

## 2017-08-06 NOTE — Telephone Encounter (Signed)
-----   Message from Virginia Crews, MD sent at 08/06/2017  3:09 PM EST ----- Normal cholesterol, A1c, thyroid function, Blood counts, kidney function, liver function, electrolytes.  Virginia Crews, MD, MPH [facility] 08/06/2017 3:09 PM

## 2017-08-06 NOTE — Telephone Encounter (Signed)
Left message advising pt. OK per DPR.

## 2017-08-12 DIAGNOSIS — Z01419 Encounter for gynecological examination (general) (routine) without abnormal findings: Secondary | ICD-10-CM | POA: Diagnosis not present

## 2017-08-12 DIAGNOSIS — Z124 Encounter for screening for malignant neoplasm of cervix: Secondary | ICD-10-CM | POA: Diagnosis not present

## 2017-08-15 LAB — HM PAP SMEAR

## 2017-08-26 DIAGNOSIS — D229 Melanocytic nevi, unspecified: Secondary | ICD-10-CM | POA: Diagnosis not present

## 2017-08-26 DIAGNOSIS — D1800 Hemangioma unspecified site: Secondary | ICD-10-CM | POA: Diagnosis not present

## 2017-08-26 DIAGNOSIS — Z1283 Encounter for screening for malignant neoplasm of skin: Secondary | ICD-10-CM | POA: Diagnosis not present

## 2017-09-01 DIAGNOSIS — Z1231 Encounter for screening mammogram for malignant neoplasm of breast: Secondary | ICD-10-CM | POA: Diagnosis not present

## 2017-09-01 LAB — HM DIABETES EYE EXAM

## 2017-09-01 LAB — HM MAMMOGRAPHY

## 2017-10-14 ENCOUNTER — Telehealth: Payer: Self-pay | Admitting: Gastroenterology

## 2017-10-14 NOTE — Telephone Encounter (Signed)
Patient ready to schedule colonoscopy.

## 2017-10-15 ENCOUNTER — Other Ambulatory Visit: Payer: Self-pay

## 2017-10-15 ENCOUNTER — Telehealth: Payer: Self-pay

## 2017-10-15 DIAGNOSIS — Z1211 Encounter for screening for malignant neoplasm of colon: Secondary | ICD-10-CM

## 2017-10-15 NOTE — Telephone Encounter (Signed)
LVM for pt to contact office to schedule her colonoscopy.

## 2017-10-15 NOTE — Telephone Encounter (Signed)
Gastroenterology Pre-Procedure Review  Request Date: 10/28/17 Requesting Physician: Dr. Bonna Gains  PATIENT REVIEW QUESTIONS: The patient responded to the following health history questions as indicated:    1. Are you having any GI issues? yes (Rectal Pressure ([place]) ) 2. Do you have a personal history of Polyps? no 3. Do you have a family history of Colon Cancer or Polyps? yes (grandmother colon cancer) 4. Diabetes Mellitus? yes (type 2 dietary controlled) 5. Joint replacements in the past 12 months?no 6. Major health problems in the past 3 months?no 7. Any artificial heart valves, MVP, or defibrillator?Patient does have a heart murmur    MEDICATIONS & ALLERGIES:    Patient reports the following regarding taking any anticoagulation/antiplatelet therapy:   Plavix, Coumadin, Eliquis, Xarelto, Lovenox, Pradaxa, Brilinta, or Effient? no Aspirin? no  Patient confirms/reports the following medications:  Current Outpatient Medications  Medication Sig Dispense Refill  . Blood Glucose Monitoring Suppl (ONE TOUCH ULTRA SYSTEM KIT) W/DEVICE KIT GLUCOMETER TEST STRIPS - Historical Medication  Check blood sugar daily ---- ULTRA ONE TOUCH  Started 11-Jul-2009 Active Comments: DX: 790.29    . carbamazepine (TEGRETOL XR) 100 MG 12 hr tablet Take 1 tablet (100 mg total) daily by mouth. 90 tablet 1  . Cranberry 500 MG CAPS Take 1 capsule by mouth daily.    Marland Kitchen DIGESTIVE ENZYMES PO Take 1 capsule by mouth daily.    . Magnesium 500 MG CAPS Take 2 tablets by mouth daily.     . Misc Natural Products (ADRENAL) 200 MG CAPS Take 1 capsule by mouth 2 (two) times daily.     . MULTIPLE VITAMIN PO Take 3 tablets by mouth daily.     . Probiotic Product (PROBIOTIC ADVANCED) CAPS Take 1 capsule by mouth daily.    Marland Kitchen SYNTHROID 25 MCG tablet Take 25 mcg by mouth daily before breakfast. 50 mcg on the weekends    . vitamin B-12 (CYANOCOBALAMIN) 1000 MCG tablet Take 1 tablet by mouth.      No current  facility-administered medications for this visit.     Patient confirms/reports the following allergies:  Allergies  Allergen Reactions  . Cortisone Other (See Comments)    High blood sugar  . Tape Swelling and Dermatitis    Redness and itching.  . Ciprofloxacin Rash  . Floxacillin [Flucloxacillin] Rash  . Levaquin [Levofloxacin In D5w] Rash    No orders of the defined types were placed in this encounter.   AUTHORIZATION INFORMATION Primary Insurance: 1D#: Group #:  Secondary Insurance: 1D#: Group #:  SCHEDULE INFORMATION: Date: 10/28/17 Time: Location:[facility]

## 2017-10-22 ENCOUNTER — Ambulatory Visit (INDEPENDENT_AMBULATORY_CARE_PROVIDER_SITE_OTHER): Payer: 59 | Admitting: Gastroenterology

## 2017-10-22 ENCOUNTER — Other Ambulatory Visit: Payer: Self-pay

## 2017-10-22 ENCOUNTER — Encounter: Payer: Self-pay | Admitting: Gastroenterology

## 2017-10-22 VITALS — BP 134/91 | HR 84 | Ht 67.0 in | Wt 191.0 lb

## 2017-10-22 DIAGNOSIS — K5900 Constipation, unspecified: Secondary | ICD-10-CM | POA: Diagnosis not present

## 2017-10-22 DIAGNOSIS — R198 Other specified symptoms and signs involving the digestive system and abdomen: Secondary | ICD-10-CM | POA: Diagnosis not present

## 2017-10-22 MED ORDER — ONDANSETRON HCL 4 MG PO TABS: 4.0000 mg | ORAL_TABLET | Freq: Three times a day (TID) | ORAL | 0 refills | Status: DC | PRN

## 2017-10-22 NOTE — Patient Instructions (Signed)
High-Fiber Diet Fiber, also called dietary fiber, is a type of carbohydrate found in fruits, vegetables, whole grains, and beans. A high-fiber diet can have many health benefits. Your health care provider may recommend a high-fiber diet to help:  Prevent constipation. Fiber can make your bowel movements more regular.  Lower your cholesterol.  Relieve hemorrhoids, uncomplicated diverticulosis, or irritable bowel syndrome.  Prevent overeating as part of a weight-loss plan.  Prevent heart disease, type 2 diabetes, and certain cancers.  What is my plan? The recommended daily intake of fiber includes:  38 grams for men under age 74.  3 grams for men over age 15.  55 grams for women under age 73.  63 grams for women over age 1.  You can get the recommended daily intake of dietary fiber by eating a variety of fruits, vegetables, grains, and beans. Your health care provider may also recommend a fiber supplement if it is not possible to get enough fiber through your diet. What do I need to know about a high-fiber diet?  Fiber supplements have not been widely studied for their effectiveness, so it is better to get fiber through food sources.  Always check the fiber content on thenutrition facts label of any prepackaged food. Look for foods that contain at least 5 grams of fiber per serving.  Ask your dietitian if you have questions about specific foods that are related to your condition, especially if those foods are not listed in the following section.  Increase your daily fiber consumption gradually. Increasing your intake of dietary fiber too quickly may cause bloating, cramping, or gas.  Drink plenty of water. Water helps you to digest fiber. What foods can I eat? Grains Whole-grain breads. Multigrain cereal. Oats and oatmeal. Brown rice. Barley. Bulgur wheat. [place]. Bran muffins. Popcorn. Rye wafer crackers. Vegetables Sweet potatoes. Spinach. Kale. Artichokes. Cabbage.  Broccoli. Green peas. Carrots. Squash. Fruits Berries. Pears. Apples. Oranges. Avocados. Prunes and raisins. Dried figs. Meats and Other Protein Sources [place], kidney, pinto, and soy beans. Split peas. Lentils. Nuts and seeds. Dairy Fiber-fortified yogurt. Beverages Fiber-fortified soy milk. Fiber-fortified orange juice. Other Fiber bars. The items listed above may not be a complete list of recommended foods or beverages. Contact your dietitian for more options. What foods are not recommended? Grains White bread. Pasta made with refined flour. White rice. Vegetables Fried potatoes. Canned vegetables. Well-cooked vegetables. Fruits Fruit juice. Cooked, strained fruit. Meats and Other Protein Sources Fatty cuts of meat. Fried Sales executive or fried fish. Dairy Milk. Yogurt. Cream cheese. Sour cream. Beverages Soft drinks. Other Cakes and pastries. Butter and oils. The items listed above may not be a complete list of foods and beverages to avoid. Contact your dietitian for more information. What are some tips for including high-fiber foods in my diet?  Eat a wide variety of high-fiber foods.  Make sure that half of all grains consumed each day are whole grains.  Replace breads and cereals made from refined flour or white flour with whole-grain breads and cereals.  Replace white rice with brown rice, bulgur wheat, or millet.  Start the day with a breakfast that is high in fiber, such as a cereal that contains at least 5 grams of fiber per serving.  Use beans in place of meat in soups, salads, or pasta.  Eat high-fiber snacks, such as berries, raw vegetables, nuts, or popcorn. This information is not intended to replace advice given to you by your health care provider. Make sure you  discuss any questions you have with your health care provider. Document Released: 09/02/2005 Document Revised: 02/08/2016 Document Reviewed: 02/15/2014 Elsevier Interactive Patient Education  2018  [place] daily F/u 3 months

## 2017-10-22 NOTE — Progress Notes (Signed)
Veronica Butler [address]  [place]  [place], [place] [address]  Main: [phone]  Fax: [phone]   Gastroenterology Consultation  Referring Provider:     Virginia Crews, MD Primary Care Physician:  Virginia Crews, MD Primary Gastroenterologist:  Dr. Vonda Butler Reason for Consultation:     Rectal pressure        HPI:   Veronica Butler is a 57 y.o. y/o female referred for consultation & management  by Dr. Brita Romp, Dionne Bucy, MD.  Patient reports chronic history of constipation, and states over the last 3 or 4 weeks she had multiple days with no bowel movements or hard stools.  She describes manual disimpaction during 1 of the days.  She started using a over-the-counter stool softener once or twice a day which has helped.  Over the last 2-3 days her bowel movements have been "mushy".  No weight loss.  Reports intermittent left lower quadrant abdominal pain, cramping, 3/10, with no radiation, relieved after bowel movements.  No nausea vomiting.  No dysphagia.  No family history of colon cancer.  No previous colonoscopies.  States 2 weeks ago she felt rectal pressure, and describes having to manually reduce a hard ball back into her rectum at that time.  She states it was not painful.  Reports some bright red blood on wiping on the toilet paper only over the last few weeks.  No previous C-sections or vaginal deliveries.  Has had a hysterectomy.  No history of pelvic trauma.  Does not eat a high-fiber diet and does not use a daily bowel regimen medication.  Past Medical History:  Diagnosis Date  . Cancer [facility]) 2002   Cervical CA with hysterectomy.  . Diabetes ([place]) 08/22/2015    Past Surgical History:  Procedure Laterality Date  . ABDOMINAL HYSTERECTOMY  2002   still has ovaries  . CHOLECYSTECTOMY  1998  . HERNIA REPAIR     along with gallbladder surgery  . navel cyst  1980'   removed  . TONSILLECTOMY  at age 1    Prior to Admission medications    Medication Sig Start Date End Date Taking? Authorizing Provider  Blood Glucose Monitoring Suppl (ONE TOUCH ULTRA SYSTEM KIT) W/DEVICE KIT GLUCOMETER TEST STRIPS - Historical Medication  Check blood sugar daily ---- ULTRA ONE TOUCH  Started 11-Jul-2009 Active Comments: DX: 790.29 07/11/09  Yes [provider]  carbamazepine (TEGRETOL XR) 100 MG 12 hr tablet Take 1 tablet (100 mg total) daily by mouth. 08/01/17  Yes Bacigalupo, Dionne Bucy, MD  Cranberry 500 MG CAPS Take 1 capsule by mouth daily. 08/19/07  Yes [provider]  DIGESTIVE ENZYMES PO Take 1 capsule by mouth daily. 10/12/09  Yes [provider]  docusate sodium (STOOL SOFTENER) 100 MG capsule Take 100 mg by mouth 2 (two) times daily.   Yes [provider]  Magnesium 500 MG CAPS Take 2 tablets by mouth daily.  10/12/09  Yes [provider]  Misc Natural Products (ADRENAL) 200 MG CAPS Take 1 capsule by mouth 2 (two) times daily.  10/12/09  Yes [provider]  MULTIPLE VITAMIN PO Take 3 tablets by mouth daily.  08/19/07  Yes [provider]  Probiotic Product (PROBIOTIC ADVANCED) CAPS Take 1 capsule by mouth daily.   Yes [provider]  SYNTHROID 25 MCG tablet Take 25 mcg by mouth daily before breakfast. 50 mcg on the weekends 01/03/14  Yes [provider]  ondansetron (ZOFRAN) 4 MG  tablet Take 1 tablet (4 mg total) by mouth every 8 (eight) hours as needed for nausea or vomiting. 10/22/17   Virgel Manifold, MD    Family History  Problem Relation Age of Onset  . Cancer Father   . Alcohol abuse Father   . Hypertension Mother   . Sarcoidosis Sister   . Cancer Maternal Grandmother        colon  . Dementia Maternal Grandmother      Social History   Tobacco Use  . Smoking status: Never Smoker  . Smokeless tobacco: Never Used  Substance Use Topics  . Alcohol use: No  . Drug use: No    Allergies as of 10/22/2017 - Review Complete 10/22/2017  Allergen  Reaction Noted  . Cortisone Other (See Comments) 02/03/2014  . Tape Swelling and Dermatitis 02/03/2014  . Ciprofloxacin Rash 02/03/2014  . Floxacillin [flucloxacillin] Rash 02/03/2014  . Levaquin [levofloxacin in d5w] Rash 02/03/2014    Review of Systems:    All systems reviewed and negative except where noted in HPI.   Physical Exam:  BP (!) 134/91   Pulse 84   Ht _0  (1.702 m)   Wt 191 lb (86.6 kg)   LMP 11/05/2000   BMI 29.91 kg/m  Patient's last menstrual period was 11/05/2000. Psych:  Alert and cooperative. Normal mood and affect. General:   Alert,  Well-developed, well-nourished, pleasant and cooperative in NAD Head:  Normocephalic and atraumatic. Eyes:  Sclera clear, no icterus.   Conjunctiva pink. Ears:  Normal auditory acuity. Nose:  No deformity, discharge, or lesions. Mouth:  No deformity or lesions,oropharynx pink & moist. Neck:  Supple; no masses or thyromegaly. Lungs:  Respirations even and unlabored.  Clear throughout to auscultation.   No wheezes, crackles, or rhonchi. No acute distress. Heart:  Regular rate and rhythm; no murmurs, clicks, rubs, or gallops. Abdomen:  Normal bowel sounds.  No bruits.  Soft, non-tender and non-distended without masses, hepatosplenomegaly or hernias noted.  No guarding or rebound tenderness.    Rectal: No external hemorrhoids, digital rectal exam did not reveal any masses or stool in the rectum.  When patient was asked to bear down, no masses were felt and no expulsion of material was seen. Msk:  Symmetrical without gross deformities. Good, equal movement & strength bilaterally. Pulses:  Normal pulses noted. Extremities:  No clubbing or edema.  No cyanosis. Neurologic:  Alert and oriented x3;  grossly normal neurologically. Skin:  Intact without significant lesions or rashes. No jaundice. Lymph Nodes:  No significant cervical adenopathy. Psych:  Alert and cooperative. Normal mood and affect.   Labs: CBC    Component Value  Date/Time   WBC 9.3 08/04/2017 0951   RBC 4.70 08/04/2017 0951   HGB 14.2 08/04/2017 0951   HGB 14.6 01/16/2016 1129   HCT 42.0 08/04/2017 0951   HCT 43.4 01/16/2016 1129   PLT 251 08/04/2017 0951   PLT 254 01/16/2016 1129   MCV 89.4 08/04/2017 0951   MCV 87 01/16/2016 1129   MCH 30.2 08/04/2017 0951   MCHC 33.8 08/04/2017 0951   RDW 11.9 08/04/2017 0951   RDW 13.6 01/16/2016 1129   LYMPHSABS 2.6 01/16/2016 1129   EOSABS 0.2 01/16/2016 1129   BASOSABS 0.0 01/16/2016 1129   CMP     Component Value Date/Time   NA 140 08/04/2017 0951   NA 140 08/25/2015 0848   K 4.5 08/04/2017 0951   CL 104 08/04/2017 0951   CO2 28 08/04/2017 0951  GLUCOSE 113 (H) 08/04/2017 0951   BUN 16 08/04/2017 0951   BUN 16 08/25/2015 0848   CREATININE 0.71 08/04/2017 0951   CALCIUM 9.3 08/04/2017 0951   PROT 6.7 08/04/2017 0951   PROT 6.6 08/25/2015 0848   ALBUMIN 4.4 08/25/2015 0848   AST 19 08/04/2017 0951   ALT 19 08/04/2017 0951   ALKPHOS 77 08/25/2015 0848   BILITOT 0.5 08/04/2017 0951   BILITOT 0.6 08/25/2015 0848   GFRNONAA 94 08/04/2017 0951   GFRAA 109 08/04/2017 0951    Imaging Studies: No results found.  Assessment and Plan:   JACOYA BAUMAN is a 57 y.o. y/o female has been referred for rectal pressure with chronic history of constipation  Patient symptoms could be related to internal hemorrhoids versus rectal prolapse No alarming findings on digital rectal exam today Prolapsed hemorrhoid could be causing her symptoms especially given her complaints of constipation requiring manual disimpaction recently. Rectal prolapse is also possible, however, no previous pelvic trauma or vaginal deliveries, so this is less likely.  Initial management for both hemorrhoids and rectal prolapse begins with a high-fiber diet maintaining soft stool. Patient educated on high-fiber diet, and instructed to start MiraLAX daily.  Patient was instructed to titrate this up to maintain 1-2 soft bowel  movements daily without straining.  She verbalized understanding.  Has never had a screening colonoscopy and will schedule this.  It would also allow for evaluation of her rectum endoscopically and for internal hemorrhoids. (I have discussed alternative options, risks & benefits,  which include, but are not limited to, bleeding, infection, perforation,respiratory complication & drug reaction.  The patient agrees with this plan & written consent will be obtained. )  Depending on colonoscopy findings, if symptoms persist, can consider general surgery or colorectal surgery referral to evaluate for rectal prolapse.  Dr Veronica Butler

## 2017-10-27 NOTE — Discharge Instructions (Signed)
General Anesthesia, Adult, Care After °These instructions provide you with information about caring for yourself after your procedure. Your health care provider may also give you more specific instructions. Your treatment has been planned according to current medical practices, but problems sometimes occur. Call your health care provider if you have any problems or questions after your procedure. °What can I expect after the procedure? °After the procedure, it is common to have: °· Vomiting. °· A sore throat. °· Mental slowness. ° °It is common to feel: °· Nauseous. °· Cold or shivery. °· Sleepy. °· Tired. °· Sore or achy, even in parts of your body where you did not have surgery. ° °Follow these instructions at home: °For at least 24 hours after the procedure: °· Do not: °? Participate in activities where you could fall or become injured. °? Drive. °? Use heavy machinery. °? Drink alcohol. °? Take sleeping pills or medicines that cause drowsiness. °? Make important decisions or sign legal documents. °? Take care of children on your own. °· Rest. °Eating and drinking °· If you vomit, drink water, juice, or soup when you can drink without vomiting. °· Drink enough fluid to keep your urine clear or pale yellow. °· Make sure you have little or no nausea before eating solid foods. °· Follow the diet recommended by your health care provider. °General instructions °· Have a responsible adult stay with you until you are awake and alert. °· Return to your normal activities as told by your health care provider. Ask your health care provider what activities are safe for you. °· Take over-the-counter and prescription medicines only as told by your health care provider. °· If you smoke, do not smoke without supervision. °· Keep all follow-up visits as told by your health care provider. This is important. °Contact a health care provider if: °· You continue to have nausea or vomiting at home, and medicines are not helpful. °· You  cannot drink fluids or start eating again. °· You cannot urinate after 8-12 hours. °· You develop a skin rash. °· You have fever. °· You have increasing redness at the site of your procedure. °Get help right away if: °· You have difficulty breathing. °· You have chest pain. °· You have unexpected bleeding. °· You feel that you are having a life-threatening or urgent problem. °This information is not intended to replace advice given to you by your health care provider. Make sure you discuss any questions you have with your health care provider. °Document Released: 12/09/2000 Document Revised: 02/05/2016 Document Reviewed: 08/17/2015 °Elsevier Interactive Patient Education © 2018 Else[employer] Inc[employer] ° °

## 2017-10-28 ENCOUNTER — Ambulatory Visit: Payer: 59 | Admitting: Anesthesiology

## 2017-10-28 ENCOUNTER — Ambulatory Visit
Admission: RE | Admit: 2017-10-28 | Discharge: 2017-10-28 | Disposition: A | Discharge status: Home / Self Care | Payer: 59 | Source: Ambulatory Visit | Attending: Gastroenterology | Admitting: Gastroenterology

## 2017-10-28 ENCOUNTER — Encounter
Admission: RE | Disposition: A | Discharge status: Home / Self Care | Payer: Self-pay | Source: Ambulatory Visit | Attending: Gastroenterology

## 2017-10-28 DIAGNOSIS — K648 Other hemorrhoids: Secondary | ICD-10-CM | POA: Diagnosis not present

## 2017-10-28 DIAGNOSIS — K5900 Constipation, unspecified: Secondary | ICD-10-CM | POA: Diagnosis not present

## 2017-10-28 DIAGNOSIS — E119 Type 2 diabetes mellitus without complications: Secondary | ICD-10-CM | POA: Insufficient documentation

## 2017-10-28 DIAGNOSIS — Z79899 Other long term (current) drug therapy: Secondary | ICD-10-CM | POA: Diagnosis not present

## 2017-10-28 DIAGNOSIS — Z1211 Encounter for screening for malignant neoplasm of colon: Secondary | ICD-10-CM | POA: Insufficient documentation

## 2017-10-28 DIAGNOSIS — G4730 Sleep apnea, unspecified: Secondary | ICD-10-CM | POA: Diagnosis not present

## 2017-10-28 DIAGNOSIS — K219 Gastro-esophageal reflux disease without esophagitis: Secondary | ICD-10-CM | POA: Diagnosis not present

## 2017-10-28 DIAGNOSIS — K5730 Diverticulosis of large intestine without perforation or abscess without bleeding: Secondary | ICD-10-CM

## 2017-10-28 DIAGNOSIS — Z8541 Personal history of malignant neoplasm of cervix uteri: Secondary | ICD-10-CM | POA: Diagnosis not present

## 2017-10-28 DIAGNOSIS — R51 Headache: Secondary | ICD-10-CM | POA: Diagnosis not present

## 2017-10-28 HISTORY — DX: Nausea with vomiting, unspecified: R11.2

## 2017-10-28 HISTORY — PX: COLONOSCOPY WITH PROPOFOL: SHX5780

## 2017-10-28 HISTORY — DX: Trigeminal neuralgia: G50.0

## 2017-10-28 HISTORY — DX: Other specified postprocedural states: Z98.890

## 2017-10-28 HISTORY — DX: Cardiac murmur, unspecified: R01.1

## 2017-10-28 HISTORY — DX: Migraine, unspecified, not intractable, without status migrainosus: G43.909

## 2017-10-28 HISTORY — DX: Sleep apnea, unspecified: G47.30

## 2017-10-28 SURGERY — COLONOSCOPY WITH PROPOFOL
Anesthesia: General | Wound class: Contaminated

## 2017-10-28 MED ORDER — PROPOFOL 10 MG/ML IV BOLUS
INTRAVENOUS | Status: DC | PRN
  Administered 2017-10-28 (×3): 20 mg via INTRAVENOUS
  Administered 2017-10-28: 50 mg via INTRAVENOUS
  Administered 2017-10-28: 30 mg via INTRAVENOUS
  Administered 2017-10-28: 100 mg via INTRAVENOUS
  Administered 2017-10-28: 30 mg via INTRAVENOUS
  Administered 2017-10-28: 10 mg via INTRAVENOUS
  Administered 2017-10-28: 30 mg via INTRAVENOUS
  Administered 2017-10-28: 40 mg via INTRAVENOUS
  Administered 2017-10-28 (×3): 20 mg via INTRAVENOUS
  Administered 2017-10-28 (×2): 30 mg via INTRAVENOUS
  Administered 2017-10-28 (×3): 20 mg via INTRAVENOUS
  Administered 2017-10-28: 30 mg via INTRAVENOUS
  Administered 2017-10-28 (×2): 20 mg via INTRAVENOUS

## 2017-10-28 MED ORDER — MEPERIDINE HCL 25 MG/ML IJ SOLN: 6.2500 mg | INTRAMUSCULAR | Status: DC | PRN

## 2017-10-28 MED ORDER — PROMETHAZINE HCL 25 MG/ML IJ SOLN
6.2500 mg | INTRAMUSCULAR | Status: DC | PRN
Start: 2017-10-28 — End: 2017-10-28

## 2017-10-28 MED ORDER — STERILE WATER FOR IRRIGATION IR SOLN
Status: DC | PRN
  Administered 2017-10-28: 09:00:00

## 2017-10-28 MED ORDER — FENTANYL CITRATE (PF) 100 MCG/2ML IJ SOLN: 25.0000 ug | INTRAMUSCULAR | Status: DC | PRN

## 2017-10-28 MED ORDER — OXYCODONE HCL 5 MG PO TABS: 5.0000 mg | ORAL_TABLET | Freq: Once | ORAL | Status: DC | PRN

## 2017-10-28 MED ORDER — LACTATED RINGERS IV SOLN
INTRAVENOUS | Status: DC | PRN
  Administered 2017-10-28: 09:00:00 via INTRAVENOUS

## 2017-10-28 MED ORDER — LACTATED RINGERS IV SOLN: 1000.0000 mL | INTRAVENOUS | Status: DC

## 2017-10-28 MED ORDER — OXYCODONE HCL 5 MG/5ML PO SOLN: 5.0000 mg | Freq: Once | ORAL | Status: DC | PRN

## 2017-10-28 MED ORDER — LIDOCAINE HCL (CARDIAC) 20 MG/ML IV SOLN
INTRAVENOUS | Status: DC | PRN
  Administered 2017-10-28: 40 mg via INTRAVENOUS

## 2017-10-28 SURGICAL SUPPLY — 24 items
CANISTER SUCT 1200ML W/VALVE (MISCELLANEOUS) ×2 IMPLANT
CLIP HMST 235XBRD CATH ROT (MISCELLANEOUS) IMPLANT
CLIP RESOLUTION 360 11X235 (MISCELLANEOUS)
ELECT REM PT RETURN 9FT ADLT (ELECTROSURGICAL)
ELECTRODE REM PT RTRN 9FT ADLT (ELECTROSURGICAL) IMPLANT
FCP ESCP3.2XJMB 240X2.8X (MISCELLANEOUS)
FORCEPS BIOP RAD 4 LRG CAP 4 (CUTTING FORCEPS) IMPLANT
FORCEPS BIOP RJ4 240 W/NDL (MISCELLANEOUS)
FORCEPS ESCP3.2XJMB 240X2.8X (MISCELLANEOUS) IMPLANT
GOWN CVR UNV OPN BCK APRN NK (MISCELLANEOUS) ×2 IMPLANT
GOWN ISOL THUMB LOOP REG UNIV (MISCELLANEOUS) ×2
INJECTOR VARIJECT VIN23 (MISCELLANEOUS) IMPLANT
KIT DEFENDO VALVE AND CONN (KITS) IMPLANT
KIT ENDO PROCEDURE OLY (KITS) ×2 IMPLANT
MARKER SPOT ENDO TATTOO 5ML (MISCELLANEOUS) IMPLANT
PROBE APC STR FIRE (PROBE) IMPLANT
RETRIEVER NET ROTH 2.5X230 LF (MISCELLANEOUS) IMPLANT
SNARE SHORT THROW 13M SML OVAL (MISCELLANEOUS) IMPLANT
SNARE SHORT THROW 30M LRG OVAL (MISCELLANEOUS) IMPLANT
SNARE SNG USE RND 15MM (INSTRUMENTS) IMPLANT
SPOT EX ENDOSCOPIC TATTOO (MISCELLANEOUS)
TRAP ETRAP POLY (MISCELLANEOUS) IMPLANT
VARIJECT INJECTOR VIN23 (MISCELLANEOUS)
WATER STERILE IRR 250ML POUR (IV SOLUTION) ×2 IMPLANT

## 2017-10-28 NOTE — Transfer of Care (Signed)
Immediate Anesthesia Transfer of Care Note  Patient: Veronica Butler  Procedure(s) Performed: COLONOSCOPY WITH PROPOFOL (N/A )  Patient Location: PACU  Anesthesia Type: General  Level of Consciousness: awake, alert  and patient cooperative  Airway and Oxygen Therapy: Patient Spontanous Breathing and Patient connected to supplemental oxygen  Post-op Assessment: Post-op Vital signs reviewed, Patient's Cardiovascular Status Stable, Respiratory Function Stable, Patent Airway and No signs of Nausea or vomiting  Post-op Vital Signs: Reviewed and stable  Complications: No apparent anesthesia complications

## 2017-10-28 NOTE — Op Note (Signed)
[facility] Gastroenterology Patient Name: Veronica Butler Procedure Date: 10/28/2017 8:43 AM MRN: [id number] Account #: [id number] Date of Birth: 07/27/1961 Admit Type: Outpatient Age: 57 Room: [facility] OR ROOM 01 Gender: Female Note Status: Finalized Procedure:            Colonoscopy Indications:          Screening for colorectal malignant neoplasm Providers:            Torre Schaumburg B. Bonna Gains MD, MD Referring MD:         Dionne Bucy. Bacigalupo (Referring MD) Medicines:            Monitored Anesthesia Care Complications:        No immediate complications. Procedure:            Pre-Anesthesia Assessment:                       - ASA Grade Assessment: II - A patient with mild                        systemic disease.                       - Prior to the procedure, a History and Physical was                        performed, and patient medications, allergies and                        sensitivities were reviewed. The patient's tolerance of                        previous anesthesia was reviewed.                       After obtaining informed consent, the colonoscope was                        passed under direct vision. Throughout the procedure,                        the patient's blood pressure, pulse, and oxygen                        saturations were monitored continuously. The Olympus                        Colonoscope 190 [id number]) was introduced through the                        anus and advanced to the the cecum, identified by                        appendiceal orifice and ileocecal valve. The                        colonoscopy was performed with ease. The patient                        tolerated the procedure well. The quality of the bowel  preparation was good except the ascending colon was                        fair. Findings:      The perianal and digital rectal examinations were normal.      A few small-mouthed diverticula were found in  the sigmoid colon and       ascending colon.      The rectum, sigmoid colon, descending colon, transverse colon, ascending       colon and cecum appeared normal.      The entire examined colon appeared normal.      Non-bleeding internal hemorrhoids were found during retroflexion. Impression:           - Diverticulosis in the sigmoid colon and in the                        ascending colon.                       - The rectum, sigmoid colon, descending colon,                        transverse colon, ascending colon and cecum are normal.                       - The entire examined colon is normal.                       - Non-bleeding internal hemorrhoids.                       - No specimens collected.                       - Pt. was seen in clinic before the procedure and had                        complained of rectal pressure. She had reported                        constipation at the time. She was recommended to start                        a high fiber diet and use Miralax or metamucil. Prior                        to the procedure today pt. reported her rectal pressure                        has completely resolved. It was likely due to                        constipation. No rectal abnormalities seen except for                        internal hemorrhids. Recommendation:       - Discharge patient to home (with escort).                       - High fiber diet.                       -  Advance diet as tolerated.                       - Continue present medications.                       - Repeat colonoscopy in 5 years for screening purposes                        due to fair prep in the right colon.                       - The findings and recommendations were discussed with                        the patient.                       - The findings and recommendations were discussed with                        the patient's family.                       - Return to primary care physician  as previously                        scheduled. Procedure Code(s):    --- Professional ---                       H6073, Colorectal cancer screening; colonoscopy on                        individual not meeting criteria for high risk Diagnosis Code(s):    --- Professional ---                       Z12.11, Encounter for screening for malignant neoplasm                        of colon                       K64.8, Other hemorrhoids                       K57.30, Diverticulosis of large intestine without                        perforation or abscess without bleeding CPT copyright 2016 American Medical Association. All rights reserved. The codes documented in this report are preliminary and upon coder review may  be revised to meet current compliance requirements.  [name], MD Margretta Sidle B. Bonna Gains MD, MD 10/28/2017 9:40:56 AM This report has been signed electronically. Number of Addenda: 0 Note Initiated On: 10/28/2017 8:43 AM Scope Withdrawal Time: 0 hours 17 minutes 29 seconds  Total Procedure Duration: 0 hours 34 minutes 31 seconds  Estimated Blood Loss: Estimated blood loss: none.      [facility]

## 2017-10-28 NOTE — H&P (Signed)
[name], MD [address], [place], [place], [place], [address] [address], [place], [place], [place], [address] Phone: [phone]  Fax: [phone]  Primary Care Physician:  Virginia Crews, MD   Pre-Procedure History & Physical: HPI:  Veronica Butler is a 57 y.o. female is here for a colonoscopy.   Past Medical History:  Diagnosis Date  . Cancer [facility]) 2002   Cervical CA with hysterectomy.  . Diabetes ([place]) 08/22/2015  . Heart murmur   . Migraines   . PONV (postoperative nausea and vomiting)   . Sleep apnea    CPAP, recently stopped using due to 50lb weight loss  . Trigeminal neuralgia     Past Surgical History:  Procedure Laterality Date  . ABDOMINAL HYSTERECTOMY  2002   still has ovaries  . CHOLECYSTECTOMY  1998  . HERNIA REPAIR     along with gallbladder surgery  . navel cyst  1980'   removed  . TONSILLECTOMY  at age 78    Prior to Admission medications   Medication Sig Start Date End Date Taking? Authorizing Provider  Blood Glucose Monitoring Suppl (ONE TOUCH ULTRA SYSTEM KIT) W/DEVICE KIT GLUCOMETER TEST STRIPS - Historical Medication  Check blood sugar daily ---- ULTRA ONE TOUCH  Started 11-Jul-2009 Active Comments: DX: 790.29 07/11/09  Yes [provider]  carbamazepine (TEGRETOL XR) 100 MG 12 hr tablet Take 1 tablet (100 mg total) daily by mouth. 08/01/17  Yes Bacigalupo, Dionne Bucy, MD  Cranberry 500 MG CAPS Take 1 capsule by mouth daily. 08/19/07  Yes [provider]  DIGESTIVE ENZYMES PO Take 1 capsule by mouth daily. 10/12/09  Yes [provider]  docusate sodium (STOOL SOFTENER) 100 MG capsule Take 100 mg by mouth 2 (two) times daily.   Yes [provider]  Magnesium 500 MG CAPS Take 2 tablets by mouth daily.  10/12/09  Yes [provider]  Misc Natural Products (ADRENAL) 200 MG CAPS Take 1 capsule by mouth 2 (two) times daily.  10/12/09  Yes [provider]  MULTIPLE VITAMIN PO Take 3  tablets by mouth daily.  08/19/07  Yes [provider]  ondansetron (ZOFRAN) 4 MG tablet Take 1 tablet (4 mg total) by mouth every 8 (eight) hours as needed for nausea or vomiting. 10/22/17  Yes Virgel Manifold, MD  Probiotic Product (PROBIOTIC ADVANCED) CAPS Take 1 capsule by mouth daily.   Yes [provider]  SYNTHROID 25 MCG tablet Take 25 mcg by mouth daily before breakfast. 50 mcg on the weekends 01/03/14  Yes [provider]    Allergies as of 10/15/2017 - Review Complete 08/01/2017  Allergen Reaction Noted  . Cortisone Other (See Comments) 02/03/2014  . Tape Swelling and Dermatitis 02/03/2014  . Ciprofloxacin Rash 02/03/2014  . Floxacillin [flucloxacillin] Rash 02/03/2014  . Levaquin [levofloxacin in d5w] Rash 02/03/2014    Family History  Problem Relation Age of Onset  . Cancer Father   . Alcohol abuse Father   . Hypertension Mother   . Sarcoidosis Sister   . Cancer Maternal Grandmother        colon  . Dementia Maternal Grandmother     Social History   Socioeconomic History  . Marital status: Married    Spouse name: Not on file  . Number of children: Not on file  . Years of education: Not on file  . Highest education level: Not on file  Social Needs  . Financial resource strain: Not on file  .  Food insecurity - worry: Not on file  . Food insecurity - inability: Not on file  . Transportation needs - medical: Not on file  . Transportation needs - non-medical: Not on file  Occupational History  . Not on file  Tobacco Use  . Smoking status: Never Smoker  . Smokeless tobacco: Never Used  Substance and Sexual Activity  . Alcohol use: No  . Drug use: No  . Sexual activity: Not on file  Other Topics Concern  . Not on file  Social History Narrative  . Not on file    Review of Systems: See HPI, otherwise negative ROS  Physical Exam: BP (!) 142/89   Pulse 75   Temp 97.8 F (36.6 C)   Ht _0  (1.676 m)   Wt 186 lb (84.4 kg)    LMP 11/05/2000   SpO2 100%   BMI 30.02 kg/m  General:   Alert,  pleasant and cooperative in NAD Head:  Normocephalic and atraumatic. Neck:  Supple; no masses or thyromegaly. Lungs:  Clear throughout to auscultation, normal respiratory effort.    Heart:  +S1, +S2, Regular rate and rhythm, No edema. Abdomen:  Soft, nontender and nondistended. Normal bowel sounds, without guarding, and without rebound.   Neurologic:  Alert and  oriented x4;  grossly normal neurologically.  Impression/Plan: Veronica Butler is here for a colonoscopy to be performed for average risk screening.  Risks, benefits, limitations, and alternatives regarding  colonoscopy have been reviewed with the patient.  Questions have been answered.  All parties agreeable.   Virgel Manifold, MD  10/28/2017, 8:06 AM

## 2017-10-28 NOTE — Anesthesia Preprocedure Evaluation (Signed)
Anesthesia Evaluation  Patient identified by MRN, date of birth, ID band Patient awake    Reviewed: Allergy & Precautions, H&P , NPO status , Patient's Chart, lab work & pertinent test results, reviewed documented beta blocker date and time   History of Anesthesia Complications (+) PONV and history of anesthetic complications  Airway Mallampati: II  TM Distance: >3 FB Neck ROM: full    Dental no notable dental hx.    Pulmonary sleep apnea ,    Pulmonary exam normal breath sounds clear to auscultation       Cardiovascular Exercise Tolerance: Good negative cardio ROS   Rhythm:regular Rate:Normal     Neuro/Psych  Headaches, negative psych ROS   GI/Hepatic GERD  ,(+) Hepatitis -  Endo/Other  diabetesHypothyroidism   Renal/GU negative Renal ROS  negative genitourinary   Musculoskeletal   Abdominal   Peds  Hematology negative hematology ROS (+)   Anesthesia Other Findings   Reproductive/Obstetrics negative OB ROS                             Anesthesia Physical Anesthesia Plan  ASA: II  Anesthesia Plan: General   Post-op Pain Management:    Induction:   PONV Risk Score and Plan:   Airway Management Planned:   Additional Equipment:   Intra-op Plan:   Post-operative Plan:   Informed Consent: I have reviewed the patients History and Physical, chart, labs and discussed the procedure including the risks, benefits and alternatives for the proposed anesthesia with the patient or authorized representative who has indicated his/her understanding and acceptance.   Dental Advisory Given  Plan Discussed with: CRNA  Anesthesia Plan Comments:         Anesthesia Quick Evaluation

## 2017-10-28 NOTE — Anesthesia Procedure Notes (Signed)
Procedure Name: MAC Date/Time: 10/28/2017 8:50 AM Performed by: Janna Arch, CRNA Pre-anesthesia Checklist: Patient identified, Emergency Drugs available, Suction available and Patient being monitored Patient Re-evaluated:Patient Re-evaluated prior to induction Oxygen Delivery Method: Nasal cannula

## 2017-10-28 NOTE — Anesthesia Postprocedure Evaluation (Signed)
Anesthesia Post Note  Patient: Veronica Butler  Procedure(s) Performed: COLONOSCOPY WITH PROPOFOL (N/A )  Patient location during evaluation: PACU Anesthesia Type: General Level of consciousness: awake and alert Pain management: pain level controlled Vital Signs Assessment: post-procedure vital signs reviewed and stable Respiratory status: spontaneous breathing, nonlabored ventilation, respiratory function stable and patient connected to nasal cannula oxygen Cardiovascular status: blood pressure returned to baseline and stable Postop Assessment: no apparent nausea or vomiting Anesthetic complications: no    Anaiz Qazi ELAINE

## 2018-01-05 DIAGNOSIS — D2262 Melanocytic nevi of left upper limb, including shoulder: Secondary | ICD-10-CM | POA: Diagnosis not present

## 2018-01-05 DIAGNOSIS — L578 Other skin changes due to chronic exposure to nonionizing radiation: Secondary | ICD-10-CM | POA: Diagnosis not present

## 2018-01-05 DIAGNOSIS — L820 Inflamed seborrheic keratosis: Secondary | ICD-10-CM | POA: Diagnosis not present

## 2018-01-19 ENCOUNTER — Other Ambulatory Visit: Payer: Self-pay

## 2018-01-19 ENCOUNTER — Ambulatory Visit (INDEPENDENT_AMBULATORY_CARE_PROVIDER_SITE_OTHER): Payer: 59 | Admitting: Gastroenterology

## 2018-01-19 ENCOUNTER — Encounter: Payer: Self-pay | Admitting: Gastroenterology

## 2018-01-19 VITALS — BP 138/88 | HR 82 | Temp 98.0°F | Ht 67.0 in | Wt 196.2 lb

## 2018-01-19 DIAGNOSIS — K5900 Constipation, unspecified: Secondary | ICD-10-CM

## 2018-01-19 NOTE — Progress Notes (Signed)
[name], MD [address]  [place]  [place], [place] [address]  Main: [phone]  Fax: [phone]   Primary Care Physician: Virginia Crews, MD  Primary Gastroenterologist:  Dr. [name]  Chief Complaint  Patient presents with  . Follow-up    no rectal pressure, having constipation    HPI: Veronica Butler is a 56 y.o. female here for follow-up of rectal pressure.  Patient was initially seen in clinic in this regard.  Subsequent colonoscopy was done in February 2019, and did not show any abnormalities in the rectum, except internal hemorrhoids.  At the time of the colonoscopy, her rectal pressure had completely resolved with high-fiber diet, and Metamucil.  Today, patient states she does not have any further rectal pressure.  No blood in stool.  She does report straining with her bowel movements at times.  She has started taking Metamucil twice a day, which has helped for more bulk to her stool, and she is not having the loose bowel when she used to have.  However 2-3 times a week she will have hard balls of stool that she has to strain with.  No weight loss.  No dysphagia, no heartburn.  No alarm symptoms.  Current Outpatient Medications  Medication Sig Dispense Refill  . Blood Glucose Monitoring Suppl (ONE TOUCH ULTRA SYSTEM KIT) W/DEVICE KIT GLUCOMETER TEST STRIPS - Historical Medication  Check blood sugar daily ---- ULTRA ONE TOUCH  Started 11-Jul-2009 Active Comments: DX: 790.29    . carbamazepine (TEGRETOL XR) 100 MG 12 hr tablet Take 1 tablet (100 mg total) daily by mouth. 90 tablet 1  . Cranberry 500 MG CAPS Take 1 capsule by mouth daily.    Marland Kitchen DIGESTIVE ENZYMES PO Take 1 capsule by mouth daily.    . Magnesium 500 MG CAPS Take 2 tablets by mouth daily.     . Misc Natural Products (ADRENAL) 200 MG CAPS Take 1 capsule by mouth 2 (two) times daily.     . MULTIPLE VITAMIN PO Take 3 tablets by mouth daily.     . ondansetron (ZOFRAN) 4 MG tablet Take 1  tablet (4 mg total) by mouth every 8 (eight) hours as needed for nausea or vomiting. 4 tablet 0  . Probiotic Product (PROBIOTIC ADVANCED) CAPS Take 1 capsule by mouth daily.    . psyllium (REGULOID) 0.52 g capsule Take 0.52 g by mouth 2 (two) times daily.    Marland Kitchen SYNTHROID 25 MCG tablet Take 25 mcg by mouth daily before breakfast. 50 mcg on the weekends    . docusate sodium (STOOL SOFTENER) 100 MG capsule Take 100 mg by mouth 2 (two) times daily.     No current facility-administered medications for this visit.     Allergies as of 01/19/2018 - Review Complete 10/28/2017  Allergen Reaction Noted  . Cortisone Other (See Comments) 02/03/2014  . Tape Swelling and Dermatitis 02/03/2014  . Ciprofloxacin Rash 02/03/2014  . Floxacillin [flucloxacillin] Rash 02/03/2014  . Levaquin [levofloxacin in d5w] Rash 02/03/2014    ROS:  General: Negative for anorexia, weight loss, fever, chills, fatigue, weakness. ENT: Negative for hoarseness, difficulty swallowing , nasal congestion. CV: Negative for chest pain, angina, palpitations, dyspnea on exertion, peripheral edema.  Respiratory: Negative for dyspnea at rest, dyspnea on exertion, cough, sputum, wheezing.  GI: See history of present illness. GU:  Negative for dysuria, hematuria, urinary incontinence, urinary frequency, nocturnal urination.  Endo: Negative for unusual weight change.    Physical Examination:  BP 138/88   Pulse 82   Temp 98 F (36.7 C) (Oral)   Ht _0  (1.702 m)   Wt 196 lb 3.2 oz (89 kg)   LMP 11/05/2000   BMI 30.73 kg/m   General: Well-nourished, well-developed in no acute distress.  Eyes: No icterus. Conjunctivae pink. Mouth: Oropharyngeal mucosa moist and pink , no lesions erythema or exudate. Neck: Supple, Trachea midline Abdomen: Bowel sounds are normal, nontender, nondistended, no hepatosplenomegaly or masses, no abdominal bruits or hernia , no rebound or guarding.   Extremities: No lower extremity edema. No  clubbing or deformities. Neuro: Alert and oriented x 3.  Grossly intact. Skin: Warm and dry, no jaundice.   Psych: Alert and cooperative, normal mood and affect.   Labs: CMP     Component Value Date/Time   NA 140 08/04/2017 0951   NA 140 08/25/2015 0848   K 4.5 08/04/2017 0951   CL 104 08/04/2017 0951   CO2 28 08/04/2017 0951   GLUCOSE 113 (H) 08/04/2017 0951   BUN 16 08/04/2017 0951   BUN 16 08/25/2015 0848   CREATININE 0.71 08/04/2017 0951   CALCIUM 9.3 08/04/2017 0951   PROT 6.7 08/04/2017 0951   PROT 6.6 08/25/2015 0848   ALBUMIN 4.4 08/25/2015 0848   AST 19 08/04/2017 0951   ALT 19 08/04/2017 0951   ALKPHOS 77 08/25/2015 0848   BILITOT 0.5 08/04/2017 0951   BILITOT 0.6 08/25/2015 0848   GFRNONAA 94 08/04/2017 0951   GFRAA 109 08/04/2017 0951   Lab Results  Component Value Date   WBC 9.3 08/04/2017   HGB 14.2 08/04/2017   HCT 42.0 08/04/2017   MCV 89.4 08/04/2017   PLT 251 08/04/2017    Imaging Studies: No results found.  Assessment and Plan:   Veronica Butler is a 57 y.o. y/o female here for follow-up of constipation and rectal pressure  Rectal pressure resolved Constipation improved with Metamucil twice a day Patient will add MiraLAX daily to her regimen, she still having hard balls of stools and straining 2-3 times a week I have asked her to hydrate well, and eat a high-fiber diet as well Last week she took a suppository due to constipation.  I have discussed that the goal should be to maintain regular soft bowel movements daily or every other day. If she does not have a bowel movement in 1 to 2 days, she can take as needed oral Dulcolax at the time over-the-counter. Next colonoscopy due in 5 years from February 2019.    Dr [name]

## 2018-01-30 ENCOUNTER — Ambulatory Visit (INDEPENDENT_AMBULATORY_CARE_PROVIDER_SITE_OTHER): Payer: 59 | Admitting: Family Medicine

## 2018-01-30 ENCOUNTER — Encounter: Payer: Self-pay | Admitting: Family Medicine

## 2018-01-30 VITALS — BP 122/82 | HR 74 | Temp 97.9°F | Resp 16 | Wt 195.0 lb

## 2018-01-30 DIAGNOSIS — E039 Hypothyroidism, unspecified: Secondary | ICD-10-CM | POA: Diagnosis not present

## 2018-01-30 DIAGNOSIS — E041 Nontoxic single thyroid nodule: Secondary | ICD-10-CM | POA: Diagnosis not present

## 2018-01-30 DIAGNOSIS — Z23 Encounter for immunization: Secondary | ICD-10-CM | POA: Diagnosis not present

## 2018-01-30 DIAGNOSIS — N183 Chronic kidney disease, stage 3 unspecified: Secondary | ICD-10-CM

## 2018-01-30 DIAGNOSIS — E1122 Type 2 diabetes mellitus with diabetic chronic kidney disease: Secondary | ICD-10-CM

## 2018-01-30 LAB — POCT GLYCOSYLATED HEMOGLOBIN (HGB A1C)
Est. average glucose Bld gHb Est-mCnc: 117
HEMOGLOBIN A1C: 5.7

## 2018-01-30 NOTE — Progress Notes (Signed)
Patient: Veronica Butler Female    DOB: [birth year]   57 y.o.   MRN: [id number] Visit Date: 01/30/2018  Today's Provider: Lavon Paganini, MD   I, Martha Clan, CMA, am acting as scribe for Lavon Paganini, MD.  Chief Complaint  Patient presents with  . Diabetes  . Hypothyroidism   Subjective:    HPI      Diabetes Mellitus Type II, Follow-up:   Lab Results  Component Value Date   HGBA1C 5.7 01/30/2018   HGBA1C 5.6 08/04/2017   HGBA1C 6.6 08/22/2015    Last seen for diabetes 6 months ago.  Management since then includes continuing lifestyle changes. She reports good compliance with treatment. Current symptoms include polyuria and have been stable. Home blood sugar records: fasting range: 117 this morning  Most Recent Eye Exam: December; will request records Weight trend: stable Current diet: pt states she reintroduced carbs into her diet Current exercise: none  Pertinent Labs:    Component Value Date/Time   CHOL 188 08/04/2017 0951   CHOL 177 08/25/2015 0848   TRIG 132 08/04/2017 0951   HDL 45 (L) 08/04/2017 0951   HDL 43 08/25/2015 0848   LDLCALC 118 (H) 08/04/2017 0951   CREATININE 0.71 08/04/2017 0951    Wt Readings from Last 3 Encounters:  01/30/18 195 lb (88.5 kg)  01/19/18 196 lb 3.2 oz (89 kg)  10/28/17 186 lb (84.4 kg)    ------------------------------------------------------------------------  Hypothyroid, follow-up:  TSH  Date Value Ref Range Status  08/04/2017 1.79 0.40 - 4.50 mIU/L Final  01/16/2016 0.884 0.450 - 4.500 uIU/mL Final  08/25/2015 2.770 0.450 - 4.500 uIU/mL Final   Wt Readings from Last 3 Encounters:  01/30/18 195 lb (88.5 kg)  01/19/18 196 lb 3.2 oz (89 kg)  10/28/17 186 lb (84.4 kg)    She was last seen for hypothyroid 6 months ago.  Management since that visit includes continuing Synthroid 25 mcg qd except 50 mcg on the weekends. She reports good compliance with treatment. She is not having side effects.    She is experiencing heat / cold intolerance She denies nervousness, palpitations and weight changes  ------------------------------------------------------------------------   Allergies  Allergen Reactions  . Cortisone Other (See Comments)    High blood sugar  . Tape Swelling and Dermatitis    Redness and itching.  . Ciprofloxacin Rash  . Floxacillin [Flucloxacillin] Rash  . Levaquin [Levofloxacin In D5w] Rash  . Quinolones Rash    Tendon pain as well as rash     Current Outpatient Medications:  .  ALPHA LIPOIC ACID PO, Take by mouth., Disp: , Rfl:  .  Blood Glucose Monitoring Suppl (ONE TOUCH ULTRA SYSTEM KIT) W/DEVICE KIT, GLUCOMETER TEST STRIPS - Historical Medication  Check blood sugar daily ---- ULTRA ONE TOUCH  Started 11-Jul-2009 Active Comments: DX: 790.29, Disp: , Rfl:  .  carbamazepine (TEGRETOL XR) 100 MG 12 hr tablet, Take 1 tablet (100 mg total) daily by mouth., Disp: 90 tablet, Rfl: 1 .  Cranberry 500 MG CAPS, Take 1 capsule by mouth daily., Disp: , Rfl:  .  DIGESTIVE ENZYMES PO, Take 1 capsule by mouth daily., Disp: , Rfl:  .  docusate sodium (STOOL SOFTENER) 100 MG capsule, Take 100 mg by mouth 2 (two) times daily., Disp: , Rfl:  .  Magnesium 500 MG CAPS, Take 2 tablets by mouth daily. , Disp: , Rfl:  .  Misc Natural Products (ADRENAL) 200 MG CAPS, Take 1 capsule by mouth  2 (two) times daily. , Disp: , Rfl:  .  MULTIPLE VITAMIN PO, Take 3 tablets by mouth daily. , Disp: , Rfl:  .  Phosphatidylserine-DHA-EPA 100-19.5-6.5 MG CAPS, Take by mouth., Disp: , Rfl:  .  Potassium 99 MG TABS, Take by mouth., Disp: , Rfl:  .  psyllium (REGULOID) 0.52 g capsule, Take 0.52 g by mouth 2 (two) times daily., Disp: , Rfl:  .  SYNTHROID 25 MCG tablet, Take 25 mcg by mouth daily before breakfast. 50 mcg on the weekends, Disp: , Rfl:   Review of Systems  Constitutional: Negative for activity change, appetite change, chills, diaphoresis, fatigue, fever and unexpected weight change.   Eyes: Negative for visual disturbance.  Endocrine: Positive for cold intolerance and polyuria. Negative for heat intolerance, polydipsia and polyphagia.    Social History   Tobacco Use  . Smoking status: Never Smoker  . Smokeless tobacco: Never Used  Substance Use Topics  . Alcohol use: No   Objective:   BP 122/82 (BP Location: Left Arm, Patient Position: Sitting, Cuff Size: Large)   Pulse 74   Temp 97.9 F (36.6 C) (Oral)   Resp 16   Wt 195 lb (88.5 kg)   LMP 11/05/2000   SpO2 97%   BMI 30.54 kg/m  Vitals:   01/30/18 1111  BP: 122/82  Pulse: 74  Resp: 16  Temp: 97.9 F (36.6 C)  TempSrc: Oral  SpO2: 97%  Weight: 195 lb (88.5 kg)     Physical Exam  Constitutional: She is oriented to person, place, and time. She appears well-developed and well-nourished. No distress.  HENT:  Head: Normocephalic and atraumatic.  Mouth/Throat: Oropharynx is clear and moist.  Eyes: Pupils are equal, round, and reactive to light. Conjunctivae are normal. No scleral icterus.  Neck: Neck supple. No thyromegaly present.  Cardiovascular: Normal rate, regular rhythm, normal heart sounds and intact distal pulses.  No murmur heard. Pulmonary/Chest: Effort normal and breath sounds normal. No respiratory distress. She has no wheezes. She has no rales.  Musculoskeletal: She exhibits no edema.  Lymphadenopathy:    She has no cervical adenopathy.  Neurological: She is alert and oriented to person, place, and time.  Skin: Skin is warm and dry. Capillary refill takes less than 2 seconds. No rash noted.  Psychiatric: She has a normal mood and affect. Her behavior is normal.  Vitals reviewed.   Diabetic Foot Exam - Simple   Simple Foot Form Diabetic Foot exam was performed with the following findings:  Yes 01/30/2018 11:39 AM  Visual Inspection No deformities, no ulcerations, no other skin breakdown bilaterally:  Yes Sensation Testing Intact to touch and monofilament testing bilaterally:   Yes Pulse Check Posterior Tibialis and Dorsalis pulse intact bilaterally:  Yes Comments        Assessment & Plan:   Problem List Items Addressed This Visit      Endocrine   Thyroid nodule    Patient was previously evaluated by endocrinology and followed every 6 months with serial thyroid ultrasounds Per their last note, she needs no further thyroid ultrasounds to monitor the nodules as they have been stable      Diabetes ([place]) - Primary    Very well controlled with diet Foot exam compelted today On no medications Up-to-date on urine microalbumin testing and pneumococcal vaccine We will request records from last eye exam  Update tetanus vaccine today Follow-up in 6 months with repeat A1c      Relevant Orders   POCT glycosylated  hemoglobin (Hb A1C) (Completed)   Hypothyroidism    Continue Synthroid at current dose Asymptomatic Recheck TSH Consider dose titration pending TSH      Relevant Orders   TSH    Other Visit Diagnoses    Need for tetanus booster       Relevant Orders   Td vaccine greater than or equal to 7yo preservative free IM (Completed)      Will send ROI to [facility] for records of pap smears and mammograms, as well as to Optho   Return in about 6 months (around 08/02/2018) for CPE.   The entirety of the information documented in the History of Present Illness, Review of Systems and Physical Exam were personally obtained by me. Portions of this information were initially documented by Raquel Sarna Ratchford, CMA and reviewed by me for thoroughness and accuracy.    Virginia Crews, MD, MPH [facility] 01/30/2018 12:07 PM

## 2018-01-30 NOTE — Assessment & Plan Note (Signed)
Continue Synthroid at current dose Asymptomatic Recheck TSH Consider dose titration pending TSH

## 2018-01-30 NOTE — Patient Instructions (Signed)

## 2018-01-30 NOTE — Assessment & Plan Note (Signed)
Very well controlled with diet Foot exam compelted today On no medications Up-to-date on urine microalbumin testing and pneumococcal vaccine We will request records from last eye exam  Update tetanus vaccine today Follow-up in 6 months with repeat A1c

## 2018-01-30 NOTE — Assessment & Plan Note (Signed)
Patient was previously evaluated by endocrinology and followed every 6 months with serial thyroid ultrasounds Per their last note, she needs no further thyroid ultrasounds to monitor the nodules as they have been stable

## 2018-01-31 LAB — TSH: TSH: 0.912 u[IU]/mL (ref 0.450–4.500)

## 2018-02-02 ENCOUNTER — Telehealth: Payer: Self-pay

## 2018-02-02 NOTE — Telephone Encounter (Signed)
Pt advised.

## 2018-02-03 ENCOUNTER — Encounter: Payer: Self-pay | Admitting: Family Medicine

## 2018-02-27 ENCOUNTER — Ambulatory Visit: Payer: Self-pay | Admitting: Family Medicine

## 2018-03-13 ENCOUNTER — Other Ambulatory Visit: Payer: Self-pay | Admitting: Family Medicine

## 2018-03-13 DIAGNOSIS — G500 Trigeminal neuralgia: Secondary | ICD-10-CM

## 2018-06-01 DIAGNOSIS — L820 Inflamed seborrheic keratosis: Secondary | ICD-10-CM | POA: Diagnosis not present

## 2018-06-23 DIAGNOSIS — J018 Other acute sinusitis: Secondary | ICD-10-CM | POA: Diagnosis not present

## 2018-07-31 NOTE — Patient Instructions (Signed)
Preventive Care 40-64 Years, Female Preventive care refers to lifestyle choices and visits with your health care provider that can promote health and wellness. What does preventive care include?  A yearly physical exam. This is also called an annual well check.  Dental exams once or twice a year.  Routine eye exams. Ask your health care provider how often you should have your eyes checked.  Personal lifestyle choices, including: ? Daily care of your teeth and gums. ? Regular physical activity. ? Eating a healthy diet. ? Avoiding tobacco and drug use. ? Limiting alcohol use. ? Practicing safe sex. ? Taking low-dose aspirin daily starting at age 58. ? Taking vitamin and mineral supplements as recommended by your health care provider. What happens during an annual well check? The services and screenings done by your health care provider during your annual well check will depend on your age, overall health, lifestyle risk factors, and family history of disease. Counseling Your health care provider may ask you questions about your:  Alcohol use.  Tobacco use.  Drug use.  Emotional well-being.  Home and relationship well-being.  Sexual activity.  Eating habits.  Work and work Statistician.  Method of birth control.  Menstrual cycle.  Pregnancy history.  Screening You may have the following tests or measurements:  Height, weight, and BMI.  Blood pressure.  Lipid and cholesterol levels. These may be checked every 5 years, or more frequently if you are over 81 years old.  Skin check.  Lung cancer screening. You may have this screening every year starting at age 78 if you have a 30-pack-year history of smoking and currently smoke or have quit within the past 15 years.  Fecal occult blood test (FOBT) of the stool. You may have this test every year starting at age 65.  Flexible sigmoidoscopy or colonoscopy. You may have a sigmoidoscopy every 5 years or a colonoscopy  every 10 years starting at age 30.  Hepatitis C blood test.  Hepatitis B blood test.  Sexually transmitted disease (STD) testing.  Diabetes screening. This is done by checking your blood sugar (glucose) after you have not eaten for a while (fasting). You may have this done every 1-3 years.  Mammogram. This may be done every 1-2 years. Talk to your health care provider about when you should start having regular mammograms. This may depend on whether you have a family history of breast cancer.  BRCA-related cancer screening. This may be done if you have a family history of breast, ovarian, tubal, or peritoneal cancers.  Pelvic exam and Pap test. This may be done every 3 years starting at age 80. Starting at age 36, this may be done every 5 years if you have a Pap test in combination with an HPV test.  Bone density scan. This is done to screen for osteoporosis. You may have this scan if you are at high risk for osteoporosis.  Discuss your test results, treatment options, and if necessary, the need for more tests with your health care provider. Vaccines Your health care provider may recommend certain vaccines, such as:  Influenza vaccine. This is recommended every year.  Tetanus, diphtheria, and acellular pertussis (Tdap, Td) vaccine. You may need a Td booster every 10 years.  Varicella vaccine. You may need this if you have not been vaccinated.  Zoster vaccine. You may need this after age 5.  Measles, mumps, and rubella (MMR) vaccine. You may need at least one dose of MMR if you were born in  1959 or later. You may also need a second dose.  Pneumococcal 13-valent conjugate (PCV13) vaccine. You may need this if you have certain conditions and were not previously vaccinated.  Pneumococcal polysaccharide (PPSV23) vaccine. You may need one or two doses if you smoke cigarettes or if you have certain conditions.  Meningococcal vaccine. You may need this if you have certain  conditions.  Hepatitis A vaccine. You may need this if you have certain conditions or if you travel or work in places where you may be exposed to hepatitis A.  Hepatitis B vaccine. You may need this if you have certain conditions or if you travel or work in places where you may be exposed to hepatitis B.  Haemophilus influenzae type b (Hib) vaccine. You may need this if you have certain conditions.  Talk to your health care provider about which screenings and vaccines you need and how often you need them. This information is not intended to replace advice given to you by your health care provider. Make sure you discuss any questions you have with your health care provider. Document Released: 09/29/2015 Document Revised: 05/22/2016 Document Reviewed: 07/04/2015 Elsevier Interactive Patient Education  2018 [employer]  

## 2018-08-03 ENCOUNTER — Encounter: Payer: Self-pay | Admitting: Family Medicine

## 2018-08-03 ENCOUNTER — Ambulatory Visit (INDEPENDENT_AMBULATORY_CARE_PROVIDER_SITE_OTHER): Payer: 59 | Admitting: Family Medicine

## 2018-08-03 VITALS — BP 129/87 | HR 77 | Temp 98.1°F | Ht 67.0 in | Wt 210.2 lb

## 2018-08-03 DIAGNOSIS — Z114 Encounter for screening for human immunodeficiency virus [HIV]: Secondary | ICD-10-CM

## 2018-08-03 DIAGNOSIS — E1142 Type 2 diabetes mellitus with diabetic polyneuropathy: Secondary | ICD-10-CM | POA: Diagnosis not present

## 2018-08-03 DIAGNOSIS — K7581 Nonalcoholic steatohepatitis (NASH): Secondary | ICD-10-CM

## 2018-08-03 DIAGNOSIS — E669 Obesity, unspecified: Secondary | ICD-10-CM | POA: Insufficient documentation

## 2018-08-03 DIAGNOSIS — E538 Deficiency of other specified B group vitamins: Secondary | ICD-10-CM | POA: Diagnosis not present

## 2018-08-03 DIAGNOSIS — Z0000 Encounter for general adult medical examination without abnormal findings: Secondary | ICD-10-CM | POA: Diagnosis not present

## 2018-08-03 DIAGNOSIS — G629 Polyneuropathy, unspecified: Secondary | ICD-10-CM

## 2018-08-03 DIAGNOSIS — Z6832 Body mass index (BMI) 32.0-32.9, adult: Secondary | ICD-10-CM

## 2018-08-03 DIAGNOSIS — E039 Hypothyroidism, unspecified: Secondary | ICD-10-CM | POA: Diagnosis not present

## 2018-08-03 LAB — POCT UA - MICROALBUMIN: MICROALBUMIN (UR) POC: 20 mg/L

## 2018-08-03 NOTE — Addendum Note (Signed)
Addended by: Jules Schick on: 08/03/2018 10:42 AM   Modules accepted: Orders

## 2018-08-03 NOTE — Progress Notes (Signed)
Patient: Veronica Butler, Female    DOB: 1961/08/16, 57 y.o.   MRN: [id number] Visit Date: 08/03/2018  Today's Provider: Lavon Paganini, MD   Chief Complaint  Patient presents with  . Annual Exam   Subjective:  I, Tiburcio Pea, CMA, am acting as a scribe for Lavon Paganini, MD.    Annual physical exam Veronica Butler is a 57 y.o. female who presents today for health maintenance and complete physical. She feels well. She reports exercising none. She reports she is sleeping well.  She is seeing GYN for pap and mammogram next month Has eye exam next month -----------------------------------------------------------------   Review of Systems  Constitutional: Negative.   HENT: Negative.   Eyes: Negative.   Respiratory: Negative.   Cardiovascular: Negative.   Gastrointestinal: Negative.   Endocrine: Negative.   Genitourinary: Negative.   Musculoskeletal: Negative.   Skin: Negative.   Allergic/Immunologic: Negative.   Neurological: Negative.   Hematological: Negative.   Psychiatric/Behavioral: Negative.     Social History      She  reports that she has never smoked. She has never used smokeless tobacco. She reports that she does not drink alcohol or use drugs.       Social History   Socioeconomic History  . Marital status: Married    Spouse name: Not on file  . Number of children: Not on file  . Years of education: Not on file  . Highest education level: Not on file  Occupational History  . Not on file  Social Needs  . Financial resource strain: Not on file  . Food insecurity:    Worry: Not on file    Inability: Not on file  . Transportation needs:    Medical: Not on file    Non-medical: Not on file  Tobacco Use  . Smoking status: Never Smoker  . Smokeless tobacco: Never Used  Substance and Sexual Activity  . Alcohol use: No  . Drug use: No  . Sexual activity: Not on file  Lifestyle  . Physical activity:    Days per week: Not on file    Minutes  per session: Not on file  . Stress: Not on file  Relationships  . Social connections:    Talks on phone: Not on file    Gets together: Not on file    Attends religious service: Not on file    Active member of club or organization: Not on file    Attends meetings of clubs or organizations: Not on file    Relationship status: Not on file  Other Topics Concern  . Not on file  Social History Narrative  . Not on file    Past Medical History:  Diagnosis Date  . Cancer [facility]) 2002   Cervical CA with hysterectomy.  . Diabetes ([place]) 08/22/2015  . Heart murmur   . Migraines   . PONV (postoperative nausea and vomiting)   . Sleep apnea    CPAP, recently stopped using due to 50lb weight loss  . Trigeminal neuralgia      Patient Active Problem List   Diagnosis Date Noted  . Special screening for malignant neoplasms, colon   . Internal hemorrhoids   . Diverticulosis of large intestine without diverticulitis   . Hypothyroidism 08/01/2017  . Trigeminal neuralgia 08/01/2017  . Diabetes ([place]) 08/22/2015  . NASH (nonalcoholic steatohepatitis) 02/22/2015  . CA cervix ([place]) 01/06/2015  . Headache, migraine 01/06/2015  . Neuropathy 01/06/2015  . Apnea, sleep 01/06/2015  .  Thyroid nodule 01/06/2015  . Chronic venous insufficiency 01/06/2015  . Chronic urinary tract infection 02/25/2009  . Allergic rhinitis 01/06/2009  . Hypertriglyceridemia 02/19/2008  . B12 deficiency 08/23/2007  . Acid reflux 08/23/2007  . Cannot sleep 08/23/2007  . Adaptive colitis 08/23/2007  . Cyst of ovary 08/23/2007    Past Surgical History:  Procedure Laterality Date  . ABDOMINAL HYSTERECTOMY  2002   still has ovaries  . CHOLECYSTECTOMY  1998  . COLONOSCOPY WITH PROPOFOL N/A 10/28/2017   Procedure: COLONOSCOPY WITH PROPOFOL;  Surgeon: Virgel Manifold, MD;  Location: [place];  Service: Endoscopy;  Laterality: N/A;  . HERNIA REPAIR     along with gallbladder surgery  . navel cyst  1980'    removed  . TONSILLECTOMY  at age 52    Family History        Family Status  Relation Name Status  . Father  Deceased  . Mother  Alive  . Sister  Alive  . MGM  Deceased        Her family history includes Alcohol abuse in her father; Cancer in her father and maternal grandmother; Dementia in her maternal grandmother; Hypertension in her mother; Sarcoidosis in her sister.      Allergies  Allergen Reactions  . Cortisone Other (See Comments)    High blood sugar  . Tape Swelling and Dermatitis    Redness and itching.  . Ciprofloxacin Rash  . Floxacillin [Flucloxacillin] Rash  . Levaquin [Levofloxacin In D5w] Rash  . Quinolones Rash    Tendon pain as well as rash     Current Outpatient Medications:  .  ALPHA LIPOIC ACID PO, Take by mouth., Disp: , Rfl:  .  Blood Glucose Monitoring Suppl (ONE TOUCH ULTRA SYSTEM KIT) W/DEVICE KIT, GLUCOMETER TEST STRIPS - Historical Medication  Check blood sugar daily ---- ULTRA ONE TOUCH  Started 11-Jul-2009 Active Comments: DX: 790.29, Disp: , Rfl:  .  carbamazepine (TEGRETOL XR) 100 MG 12 hr tablet, TAKE 1 TABLET DAILY BY  MOUTH., Disp: 90 tablet, Rfl: 1 .  Cranberry 500 MG CAPS, Take 1 capsule by mouth daily., Disp: , Rfl:  .  DIGESTIVE ENZYMES PO, Take 1 capsule by mouth daily., Disp: , Rfl:  .  docusate sodium (STOOL SOFTENER) 100 MG capsule, Take 100 mg by mouth 2 (two) times daily., Disp: , Rfl:  .  Magnesium 500 MG CAPS, Take 2 tablets by mouth daily. , Disp: , Rfl:  .  Misc Natural Products (ADRENAL) 200 MG CAPS, Take 1 capsule by mouth 2 (two) times daily. , Disp: , Rfl:  .  MULTIPLE VITAMIN PO, Take 3 tablets by mouth daily. , Disp: , Rfl:  .  Phosphatidylserine-DHA-EPA 100-19.5-6.5 MG CAPS, Take by mouth., Disp: , Rfl:  .  Potassium 99 MG TABS, Take by mouth., Disp: , Rfl:  .  psyllium (REGULOID) 0.52 g capsule, Take 0.52 g by mouth 2 (two) times daily., Disp: , Rfl:  .  SYNTHROID 25 MCG tablet, Take 25 mcg by mouth daily before  breakfast. 50 mcg on the weekends, Disp: , Rfl:    Patient Care Team: Virginia Crews, MD as PCP - General (Family Medicine)      Objective:   Vitals: BP 129/87 (BP Location: Left Arm, Patient Position: Sitting, Cuff Size: Large)   Pulse 77   Temp 98.1 F (36.7 C) (Oral)   Ht '5\' 7"'  (1.702 m)   Wt 210 lb 3.2 oz (95.3 kg)   LMP  11/05/2000   SpO2 99%   BMI 32.92 kg/m    Vitals:   08/03/18 1008  BP: 129/87  Pulse: 77  Temp: 98.1 F (36.7 C)  TempSrc: Oral  SpO2: 99%  Weight: 210 lb 3.2 oz (95.3 kg)  Height: '5\' 7"'  (1.702 m)     Physical Exam  Constitutional: She is oriented to person, place, and time. She appears well-developed and well-nourished. No distress.  HENT:  Head: Normocephalic and atraumatic.  Right Ear: External ear normal.  Left Ear: External ear normal.  Nose: Nose normal.  Mouth/Throat: Oropharynx is clear and moist.  Eyes: Pupils are equal, round, and reactive to light. Conjunctivae and EOM are normal. No scleral icterus.  Neck: Neck supple. No thyromegaly present.  Cardiovascular: Normal rate, regular rhythm, normal heart sounds and intact distal pulses.  No murmur heard. Pulmonary/Chest: Effort normal and breath sounds normal. No respiratory distress. She has no wheezes. She has no rales.  Abdominal: Soft. Bowel sounds are normal. She exhibits no distension. There is no tenderness. There is no rebound and no guarding.  Musculoskeletal: She exhibits no edema or deformity.  Lymphadenopathy:    She has no cervical adenopathy.  Neurological: She is alert and oriented to person, place, and time.  Skin: Skin is warm and dry. Capillary refill takes less than 2 seconds. No rash noted.  Psychiatric: She has a normal mood and affect. Her behavior is normal.  Vitals reviewed.    Depression Screen PHQ 2/9 Scores 08/03/2018 08/01/2017  PHQ - 2 Score 0 0  PHQ- 9 Score 2 0     Assessment & Plan:     Routine Health Maintenance and Physical  Exam  Exercise Activities and Dietary recommendations Goals   None     Immunization History  Administered Date(s) Administered  . Influenza,inj,Quad PF,6+ Mos 06/01/2015, 08/01/2017, 07/17/2018  . Pneumococcal Polysaccharide-23 08/01/2017  . Td 01/30/2018  . Tdap 08/19/2007    Health Maintenance  Topic Date Due  . HIV Screening  03/10/1974  . PAP SMEAR  03/10/1980  . MAMMOGRAM  03/10/2009  . URINE MICROALBUMIN  08/01/2018  . HEMOGLOBIN A1C  08/02/2018  . OPHTHALMOLOGY EXAM  09/01/2018  . FOOT EXAM  01/31/2019  . COLONOSCOPY  10/29/2027  . TETANUS/TDAP  01/31/2028  . INFLUENZA VACCINE  Completed  . PNEUMOCOCCAL POLYSACCHARIDE VACCINE AGE 17-64 HIGH RISK  Completed  . Hepatitis C Screening  Completed     Discussed health benefits of physical activity, and encouraged her to engage in regular exercise appropriate for her age and condition.    --------------------------------------------------------------------  Problem List Items Addressed This Visit      Digestive   NASH (nonalcoholic steatohepatitis)   Relevant Orders   Comprehensive metabolic panel     Endocrine   Diabetes ([place])   Relevant Orders   Lipid panel   Comprehensive metabolic panel   Hemoglobin A1c   Hypothyroidism   Relevant Orders   TSH     Nervous and Auditory   Neuropathy   Relevant Orders   B12     Other   B12 deficiency   Relevant Orders   B12   Obesity    Other Visit Diagnoses    Encounter for annual physical exam    -  Primary   Screening for HIV (human immunodeficiency virus)       Relevant Orders   HIV Antibody (routine testing w rflx)       Return in about 6 months (around 02/01/2019) for chronic  disease f/u.   The entirety of the information documented in the History of Present Illness, Review of Systems and Physical Exam were personally obtained by me. Portions of this information were initially documented by Tiburcio Pea, CMA and reviewed by me for thoroughness and  accuracy.    Virginia Crews, MD, MPH [facility] 08/03/2018 10:21 AM

## 2018-08-04 ENCOUNTER — Other Ambulatory Visit: Payer: Self-pay | Admitting: Family Medicine

## 2018-08-04 DIAGNOSIS — K7581 Nonalcoholic steatohepatitis (NASH): Secondary | ICD-10-CM | POA: Diagnosis not present

## 2018-08-04 DIAGNOSIS — G500 Trigeminal neuralgia: Secondary | ICD-10-CM

## 2018-08-04 DIAGNOSIS — E039 Hypothyroidism, unspecified: Secondary | ICD-10-CM | POA: Diagnosis not present

## 2018-08-04 DIAGNOSIS — E119 Type 2 diabetes mellitus without complications: Secondary | ICD-10-CM | POA: Diagnosis not present

## 2018-08-05 LAB — HIV ANTIBODY (ROUTINE TESTING W REFLEX): HIV Screen 4th Generation wRfx: NONREACTIVE

## 2018-08-05 LAB — COMPREHENSIVE METABOLIC PANEL
ALT: 18 IU/L (ref 0–32)
AST: 25 IU/L (ref 0–40)
Albumin/Globulin Ratio: 2.4 — ABNORMAL HIGH (ref 1.2–2.2)
Albumin: 4.5 g/dL (ref 3.5–5.5)
Alkaline Phosphatase: 76 IU/L (ref 39–117)
BUN / CREAT RATIO: 18 (ref 9–23)
BUN: 15 mg/dL (ref 6–24)
Bilirubin Total: 0.4 mg/dL (ref 0.0–1.2)
CALCIUM: 9.5 mg/dL (ref 8.7–10.2)
CO2: 23 mmol/L (ref 20–29)
CREATININE: 0.83 mg/dL (ref 0.57–1.00)
Chloride: 100 mmol/L (ref 96–106)
GFR calc non Af Amer: 77 mL/min/{1.73_m2} (ref >59)
GFR, EST AFRICAN AMERICAN: 89 mL/min/{1.73_m2} (ref >59)
GLOBULIN, TOTAL: 1.9 g/dL (ref 1.5–4.5)
Glucose: 116 mg/dL — ABNORMAL HIGH (ref 65–99)
Potassium: 4.4 mmol/L (ref 3.5–5.2)
SODIUM: 141 mmol/L (ref 134–144)
Total Protein: 6.4 g/dL (ref 6.0–8.5)

## 2018-08-05 LAB — HEMOGLOBIN A1C
Est. average glucose Bld gHb Est-mCnc: 114 mg/dL
Hgb A1c MFr Bld: 5.6 % (ref 4.8–5.6)

## 2018-08-05 LAB — LIPID PANEL
CHOL/HDL RATIO: 4.4 ratio (ref 0.0–4.4)
CHOLESTEROL TOTAL: 216 mg/dL — AB (ref 100–199)
HDL: 49 mg/dL (ref >39)
LDL Calculated: 127 mg/dL — ABNORMAL HIGH (ref 0–99)
Triglycerides: 198 mg/dL — ABNORMAL HIGH (ref 0–149)
VLDL Cholesterol Cal: 40 mg/dL (ref 5–40)

## 2018-08-05 LAB — TSH: TSH: 2.34 u[IU]/mL (ref 0.450–4.500)

## 2018-08-05 LAB — VITAMIN B12: Vitamin B-12: 1064 pg/mL (ref 232–1245)

## 2018-08-05 NOTE — Telephone Encounter (Signed)
Hope thinks are well.

## 2018-08-06 ENCOUNTER — Telehealth: Payer: Self-pay

## 2018-08-06 NOTE — Telephone Encounter (Signed)
-----   Message from Virginia Crews, MD sent at 08/05/2018  4:59 PM EST ----- Cholesterol is not to goal given history of diabetes.  LDL, bad cholesterol, should be less than 70.  I do recommend a statin to lower cholesterol and lower heart disease/stroke risk.  Normal electrolytes, kidney function, liver function, thyroid function, B12 level.  A1c is well controlled at 5.6.

## 2018-08-06 NOTE — Telephone Encounter (Signed)
Patient was advised and states she will get back on her cholesterol medication.

## 2018-08-14 ENCOUNTER — Telehealth: Payer: Self-pay

## 2018-08-14 DIAGNOSIS — R509 Fever, unspecified: Secondary | ICD-10-CM | POA: Diagnosis not present

## 2018-08-14 DIAGNOSIS — R55 Syncope and collapse: Secondary | ICD-10-CM | POA: Diagnosis not present

## 2018-08-14 DIAGNOSIS — J0140 Acute pansinusitis, unspecified: Secondary | ICD-10-CM | POA: Diagnosis not present

## 2018-08-14 NOTE — Telephone Encounter (Signed)
Patient's husband Jenny Reichmann advised as below and verbally voiced understanding.

## 2018-08-14 NOTE — Telephone Encounter (Signed)
Patient's husband called and reports his wife is not feeling well.Reports that she is running a fever and spitting up mucus. I advised the husband we are full today because we only have 3 PA and before I finish my sentence patient states they are coming up here at 12 and he is not taking 'NO" for and answer until I asked the doctor first. Told Mr.Hesse that Dr. Edythe Clarity was not in the office today and he said "don't care who she sees. He said we only come when she is really sick or we really need something.Patient wife started saying he has medical background and that he really needs his wife to be seen and that he will be expecting the phone call soon.  Please Advise.

## 2018-08-14 NOTE — Telephone Encounter (Signed)
If symptoms onset just today she needs to be seen at urgent care to be flu tested. Unfortunately we are completely booked and have no availabilities.

## 2018-08-17 DIAGNOSIS — J019 Acute sinusitis, unspecified: Secondary | ICD-10-CM | POA: Diagnosis not present

## 2018-08-18 DIAGNOSIS — Z01419 Encounter for gynecological examination (general) (routine) without abnormal findings: Secondary | ICD-10-CM | POA: Diagnosis not present

## 2018-08-18 DIAGNOSIS — Z124 Encounter for screening for malignant neoplasm of cervix: Secondary | ICD-10-CM | POA: Diagnosis not present

## 2018-08-18 DIAGNOSIS — R300 Dysuria: Secondary | ICD-10-CM | POA: Diagnosis not present

## 2018-08-20 LAB — HM PAP SMEAR

## 2018-08-25 DIAGNOSIS — D485 Neoplasm of uncertain behavior of skin: Secondary | ICD-10-CM | POA: Diagnosis not present

## 2018-08-25 DIAGNOSIS — L820 Inflamed seborrheic keratosis: Secondary | ICD-10-CM | POA: Diagnosis not present

## 2018-08-25 DIAGNOSIS — Z1283 Encounter for screening for malignant neoplasm of skin: Secondary | ICD-10-CM | POA: Diagnosis not present

## 2018-08-25 DIAGNOSIS — D229 Melanocytic nevi, unspecified: Secondary | ICD-10-CM | POA: Diagnosis not present

## 2018-09-03 DIAGNOSIS — Z1231 Encounter for screening mammogram for malignant neoplasm of breast: Secondary | ICD-10-CM | POA: Diagnosis not present

## 2018-09-03 DIAGNOSIS — Z780 Asymptomatic menopausal state: Secondary | ICD-10-CM | POA: Diagnosis not present

## 2018-09-14 LAB — HM DIABETES EYE EXAM

## 2018-10-22 ENCOUNTER — Other Ambulatory Visit: Payer: Self-pay | Admitting: Family Medicine

## 2018-10-22 DIAGNOSIS — E039 Hypothyroidism, unspecified: Secondary | ICD-10-CM

## 2018-10-22 MED ORDER — LEVOTHYROXINE SODIUM 25 MCG PO TABS: 25.0000 ug | ORAL_TABLET | Freq: Every day | ORAL | 0 refills | Status: DC

## 2018-10-22 MED ORDER — SYNTHROID 25 MCG PO TABS: 25.0000 ug | ORAL_TABLET | Freq: Every day | ORAL | 0 refills | Status: DC

## 2018-10-22 NOTE — Telephone Encounter (Signed)
Pt calling back to check on the status of this refill.  Pt is out and hasn't taken the synthroid  Today.  Pt wanting a call back today before 5 pm.  Thanks, [place]

## 2018-10-22 NOTE — Telephone Encounter (Signed)
She is out and needs a refill today if possible  Thanks teri

## 2018-10-22 NOTE — Telephone Encounter (Signed)
Pt needing a emergency refill on  SYNTHROID 25 MCG tablet   [facility] into  [facility]  And 90 day into [place], [place] [phone] (Phone) [phone] (Fax)   Thanks, [facility]

## 2018-10-22 NOTE — Telephone Encounter (Signed)
Will send a 30 day supply to the [place] and Dr. Brita Romp will probably get a request from [place] for annual refill of Synthroid

## 2018-10-26 MED ORDER — LEVOTHYROXINE SODIUM 25 MCG PO TABS: 25.0000 ug | ORAL_TABLET | Freq: Every day | ORAL | 4 refills | Status: DC

## 2018-11-09 DIAGNOSIS — L720 Epidermal cyst: Secondary | ICD-10-CM | POA: Diagnosis not present

## 2018-11-09 DIAGNOSIS — L728 Other follicular cysts of the skin and subcutaneous tissue: Secondary | ICD-10-CM | POA: Diagnosis not present

## 2018-11-09 DIAGNOSIS — D2239 Melanocytic nevi of other parts of face: Secondary | ICD-10-CM | POA: Diagnosis not present

## 2018-11-14 ENCOUNTER — Other Ambulatory Visit: Payer: Self-pay | Admitting: Family Medicine

## 2018-11-14 DIAGNOSIS — E039 Hypothyroidism, unspecified: Secondary | ICD-10-CM

## 2018-11-16 MED ORDER — SYNTHROID 25 MCG PO TABS: 25.0000 ug | ORAL_TABLET | Freq: Every day | ORAL | 0 refills | Status: DC

## 2018-11-16 NOTE — Telephone Encounter (Signed)
Please review for Dr. Fisher.   Thanks,   -Arion Shankles  

## 2019-01-21 ENCOUNTER — Other Ambulatory Visit: Payer: Self-pay | Admitting: Family Medicine

## 2019-01-21 DIAGNOSIS — G500 Trigeminal neuralgia: Secondary | ICD-10-CM

## 2019-02-01 ENCOUNTER — Other Ambulatory Visit: Payer: Self-pay

## 2019-02-01 DIAGNOSIS — E039 Hypothyroidism, unspecified: Secondary | ICD-10-CM

## 2019-02-01 DIAGNOSIS — G500 Trigeminal neuralgia: Secondary | ICD-10-CM

## 2019-02-01 MED ORDER — CARBAMAZEPINE ER 100 MG PO TB12: 100.0000 mg | ORAL_TABLET | Freq: Every day | ORAL | 0 refills | Status: DC

## 2019-02-01 MED ORDER — SYNTHROID 25 MCG PO TABS: 25.0000 ug | ORAL_TABLET | Freq: Every day | ORAL | 0 refills | Status: DC

## 2019-02-01 NOTE — Telephone Encounter (Signed)
Patient advised. 1 month [facility]. Rescheduled appointment.

## 2019-02-01 NOTE — Telephone Encounter (Signed)
Patient is requesting refills via MyChart. She canceled her appointment that was scheduled on 02/03/2019 and stated she would call back to reschedule once the pandemic is over.

## 2019-02-01 NOTE — Telephone Encounter (Signed)
Can refill for 1 month and encourage her to be seen before running out.  Pandemic may not be "over" for a long time, but we are starting to phase back in and do not want to miss/neglect routine care

## 2019-02-03 ENCOUNTER — Ambulatory Visit: Payer: 59 | Admitting: Family Medicine

## 2019-03-08 ENCOUNTER — Other Ambulatory Visit: Payer: Self-pay | Admitting: Family Medicine

## 2019-03-08 DIAGNOSIS — G500 Trigeminal neuralgia: Secondary | ICD-10-CM

## 2019-03-16 NOTE — Progress Notes (Signed)
Patient: Veronica Butler Female    DOB: [birth year]   58 y.o.   MRN: [id number] Visit Date: 03/17/2019  Today's Provider: Lavon Paganini, MD   Chief Complaint  Patient presents with  . Diabetes   Subjective:    Virtual Visit via Telephone Note  I connected with Veronica Butler on 03/17/19 at 11:20 AM EDT by telephone and verified that I am speaking with the correct person using two identifiers.  Patient location: home Provider location: [place] involved in the visit: patient, provider   I discussed the limitations, risks, security and privacy concerns of performing an evaluation and management service by telephone and the availability of in person appointments. I also discussed with the patient that there may be a patient responsible charge related to this service. The patient expressed understanding and agreed to proceed.  HPI  Diabetes Mellitus Type II, Follow-up:   Lab Results  Component Value Date   HGBA1C 5.6 08/04/2018   HGBA1C 5.7 01/30/2018   HGBA1C 5.6 08/04/2017    Last seen for diabetes 6 months ago.  Management since then includes no changes. She reports good compliance with treatment. She is not having side effects.  Current symptoms include none  Home blood sugar records: periodically being checked  Episodes of hypoglycemia? no   Current insulin regiment: none Most Recent Eye Exam: not UTD Weight trend: increasing steadily Current exercise: none   Pertinent Labs:    Component Value Date/Time   CHOL 216 (H) 08/04/2018 0904   TRIG 198 (H) 08/04/2018 0904   HDL 49 08/04/2018 0904   LDLCALC 127 (H) 08/04/2018 0904   LDLCALC 118 (H) 08/04/2017 0951   CREATININE 0.83 08/04/2018 0904   CREATININE 0.71 08/04/2017 0951    Wt Readings from Last 3 Encounters:  08/03/18 210 lb 3.2 oz (95.3 kg)  01/30/18 195 lb (88.5 kg)  01/19/18 196 lb 3.2 oz (89 kg)     ------------------------------------------------------------------------  Hypothyroidism: Taking synthroid with good compliance.  Asymptomatic  Cramping in hands, feet, and even in rib cage intermittently at night for last several nights.  Taking magnesium and potassium supplements.    Allergies  Allergen Reactions  . Cortisone Other (See Comments)    High blood sugar  . Tape Swelling and Dermatitis    Redness and itching.  . Ciprofloxacin Rash  . Floxacillin [Flucloxacillin] Rash  . Levaquin [Levofloxacin In D5w] Rash  . Quinolones Rash    Tendon pain as well as rash     Current Outpatient Medications:  .  ALPHA LIPOIC ACID PO, Take by mouth., Disp: , Rfl:  .  Blood Glucose Monitoring Suppl (ONE TOUCH ULTRA SYSTEM KIT) W/DEVICE KIT, GLUCOMETER TEST STRIPS - Historical Medication  Check blood sugar daily ---- ULTRA ONE TOUCH  Started 11-Jul-2009 Active Comments: DX: 790.29, Disp: , Rfl:  .  carbamazepine (TEGRETOL XR) 100 MG 12 hr tablet, TAKE 1 TABLET BY MOUTH EVERY DAY, Disp: 30 tablet, Rfl: 0 .  Cranberry 500 MG CAPS, Take 1 capsule by mouth daily., Disp: , Rfl:  .  DIGESTIVE ENZYMES PO, Take 1 capsule by mouth daily., Disp: , Rfl:  .  docusate sodium (STOOL SOFTENER) 100 MG capsule, Take 100 mg by mouth 2 (two) times daily., Disp: , Rfl:  .  levothyroxine (SYNTHROID) 25 MCG tablet, Take 1 tablet (25 mcg total) by mouth daily before breakfast. 50 mcg on the weekends, Disp: 90 tablet, Rfl: 4 .  Magnesium 500 MG  CAPS, Take 2 tablets by mouth daily. , Disp: , Rfl:  .  Misc Natural Products (ADRENAL) 200 MG CAPS, Take 1 capsule by mouth 2 (two) times daily. , Disp: , Rfl:  .  MULTIPLE VITAMIN PO, Take 3 tablets by mouth daily. , Disp: , Rfl:  .  Phosphatidylserine-DHA-EPA 100-19.5-6.5 MG CAPS, Take by mouth., Disp: , Rfl:  .  Potassium 99 MG TABS, Take by mouth., Disp: , Rfl:  .  psyllium (REGULOID) 0.52 g capsule, Take 0.52 g by mouth 2 (two) times daily., Disp: , Rfl:  .   SYNTHROID 25 MCG tablet, Take 1 tablet (25 mcg total) by mouth daily before breakfast. 50 mcg on the weekends, Disp: 40 tablet, Rfl: 0 .  SYNTHROID 25 MCG tablet, Take 1 tablet (25 mcg total) by mouth daily before breakfast. 50 mcg on the weekends, Disp: 40 tablet, Rfl: 0  Review of Systems  Constitutional: Positive for unexpected weight change.  Respiratory: Negative.   Cardiovascular: Negative.   Musculoskeletal: Negative.     Social History   Tobacco Use  . Smoking status: Never Smoker  . Smokeless tobacco: Never Used  Substance Use Topics  . Alcohol use: No      Objective:   LMP 11/05/2000  There were no vitals filed for this visit.   Physical Exam   No results found for any visits on 03/17/19.     Assessment & Plan    I discussed the assessment and treatment plan with the patient. The patient was provided an opportunity to ask questions and all were answered. The patient agreed with the plan and demonstrated an understanding of the instructions.   The patient was advised to call back or seek an in-person evaluation if the symptoms worsen or if the condition fails to improve as anticipated.  Problem List Items Addressed This Visit      Endocrine   Diabetes ([place]) - Primary    Very well controlled with diet Foot exam at next in person visit Recheck A1c Send ROI for last eye exam UTD on vaccines      Relevant Orders   Hemoglobin A1c   Hypothyroidism    Asymptomatic, previously well controled Continue synthroid Recheck TSH      Relevant Medications   SYNTHROID 25 MCG tablet   Other Relevant Orders   TSH     Nervous and Auditory   Trigeminal neuralgia    Continue tegretol at current low dose Well controlled on this No longer followed by Neurology recehck LFTs at upcoming CPE (annually)      Relevant Medications   carbamazepine (TEGRETOL XR) 100 MG 12 hr tablet     Other   Bilateral leg cramps    New problem Check renal function panel and TSH  Advised on stretching, tonic water, mustard No claudication symptoms      Relevant Orders   Renal Function Panel    Other Visit Diagnoses    Trigeminal neuralgia of left side of face       Relevant Medications   carbamazepine (TEGRETOL XR) 100 MG 12 hr tablet       Return in about 5 months (around 08/17/2019) for CPE after 11/18.   The entirety of the information documented in the History of Present Illness, Review of Systems and Physical Exam were personally obtained by me. Portions of this information were initially documented by Tiburcio Pea, CMA and reviewed by me for thoroughness and accuracy.    Virginia Crews, MD MPH  [facility]

## 2019-03-17 ENCOUNTER — Encounter: Payer: Self-pay | Admitting: Family Medicine

## 2019-03-17 ENCOUNTER — Ambulatory Visit (INDEPENDENT_AMBULATORY_CARE_PROVIDER_SITE_OTHER): Payer: 59 | Admitting: Family Medicine

## 2019-03-17 VITALS — BP 118/80 | HR 80 | Temp 97.7°F

## 2019-03-17 DIAGNOSIS — E039 Hypothyroidism, unspecified: Secondary | ICD-10-CM | POA: Diagnosis not present

## 2019-03-17 DIAGNOSIS — E1142 Type 2 diabetes mellitus with diabetic polyneuropathy: Secondary | ICD-10-CM

## 2019-03-17 DIAGNOSIS — G500 Trigeminal neuralgia: Secondary | ICD-10-CM | POA: Diagnosis not present

## 2019-03-17 DIAGNOSIS — R252 Cramp and spasm: Secondary | ICD-10-CM | POA: Diagnosis not present

## 2019-03-17 MED ORDER — CARBAMAZEPINE ER 100 MG PO TB12: 100.0000 mg | ORAL_TABLET | Freq: Every day | ORAL | 1 refills | Status: DC

## 2019-03-17 MED ORDER — SYNTHROID 25 MCG PO TABS: 25.0000 ug | ORAL_TABLET | Freq: Every day | ORAL | 3 refills | Status: DC

## 2019-03-17 NOTE — Assessment & Plan Note (Signed)
New problem Check renal function panel and TSH Advised on stretching, tonic water, mustard No claudication symptoms

## 2019-03-17 NOTE — Assessment & Plan Note (Signed)
Asymptomatic, previously well controled Continue synthroid Recheck TSH

## 2019-03-17 NOTE — Assessment & Plan Note (Signed)
Continue tegretol at current low dose Well controlled on this No longer followed by Neurology recehck LFTs at upcoming CPE (annually)

## 2019-03-17 NOTE — Assessment & Plan Note (Signed)
Very well controlled with diet Foot exam at next in person visit Recheck A1c Send ROI for last eye exam UTD on vaccines

## 2019-03-19 LAB — RENAL FUNCTION PANEL
Albumin: 4.7 g/dL (ref 3.8–4.9)
BUN/Creatinine Ratio: 16 (ref 12–28)
BUN: 14 mg/dL (ref 8–27)
CO2: 23 mmol/L (ref 20–29)
Calcium: 9.5 mg/dL (ref 8.7–10.3)
Chloride: 101 mmol/L (ref 96–106)
Creatinine, Ser: 0.89 mg/dL (ref 0.57–1.00)
GFR calc Af Amer: 81 mL/min/{1.73_m2} (ref >59)
GFR calc non Af Amer: 71 mL/min/{1.73_m2} (ref >59)
Glucose: 123 mg/dL — ABNORMAL HIGH (ref 65–99)
Phosphorus: 3.5 mg/dL (ref 3.0–4.3)
Potassium: 4.6 mmol/L (ref 3.5–5.2)
Sodium: 144 mmol/L (ref 134–144)

## 2019-03-19 LAB — HEMOGLOBIN A1C
Est. average glucose Bld gHb Est-mCnc: 131 mg/dL
Hgb A1c MFr Bld: 6.2 % — ABNORMAL HIGH (ref 4.8–5.6)

## 2019-03-19 LAB — TSH: TSH: 1.82 u[IU]/mL (ref 0.450–4.500)

## 2019-03-22 ENCOUNTER — Telehealth: Payer: Self-pay

## 2019-03-22 NOTE — Telephone Encounter (Signed)
-----   Message from Virginia Crews, MD sent at 03/22/2019  8:10 AM EDT ----- Normal labs, except A1c has increased from 5.6 to 6.2.  Recommend low carb diet and exercise.  If this gets above 6.5, we will need to discuss possible medications. Repeat in 3-6 months

## 2019-03-22 NOTE — Telephone Encounter (Signed)
Patient advised.

## 2019-03-22 NOTE — Telephone Encounter (Signed)
LVMTRC 

## 2019-03-24 ENCOUNTER — Other Ambulatory Visit: Payer: Self-pay | Admitting: Family Medicine

## 2019-03-24 DIAGNOSIS — E039 Hypothyroidism, unspecified: Secondary | ICD-10-CM

## 2019-05-07 ENCOUNTER — Telehealth: Payer: Self-pay | Admitting: Family Medicine

## 2019-05-07 DIAGNOSIS — E039 Hypothyroidism, unspecified: Secondary | ICD-10-CM

## 2019-05-07 MED ORDER — SYNTHROID 25 MCG PO TABS: 25.0000 ug | ORAL_TABLET | Freq: Every day | ORAL | 5 refills | Status: DC

## 2019-05-07 NOTE — Telephone Encounter (Signed)
Pt called saying her insurance sent her a letter saying they will no longer cover Synthroid.  Generic will be covered but patient does not want generic.  She is ok to pay the difference.  Letter stated primary will have to give an ok for patient to continue to take the name brand synthroid  CB#  [phone]  [place]

## 2019-05-07 NOTE — Telephone Encounter (Signed)
I sent a refill to the pharmacy with a note ok'ing brand name only

## 2019-08-05 ENCOUNTER — Encounter: Payer: Self-pay | Admitting: Family Medicine

## 2019-08-31 LAB — HM PAP SMEAR: HM Pap smear: NORMAL

## 2019-09-16 LAB — HM MAMMOGRAPHY

## 2019-09-25 ENCOUNTER — Other Ambulatory Visit: Payer: Self-pay | Admitting: Family Medicine

## 2019-09-25 DIAGNOSIS — G500 Trigeminal neuralgia: Secondary | ICD-10-CM

## 2019-11-29 ENCOUNTER — Ambulatory Visit (INDEPENDENT_AMBULATORY_CARE_PROVIDER_SITE_OTHER): Payer: 59 | Admitting: Dermatology

## 2019-11-29 ENCOUNTER — Other Ambulatory Visit: Payer: Self-pay

## 2019-11-29 ENCOUNTER — Encounter: Payer: Self-pay | Admitting: Dermatology

## 2019-11-29 DIAGNOSIS — D239 Other benign neoplasm of skin, unspecified: Secondary | ICD-10-CM

## 2019-11-29 DIAGNOSIS — D235 Other benign neoplasm of skin of trunk: Secondary | ICD-10-CM

## 2019-11-29 NOTE — Patient Instructions (Addendum)
Wound Care Instructions  On the day following your surgery, you should begin doing daily dressing changes: 1. Remove the old dressing and discard it. 2. Cleanse the wound gently with tap water. This may be done in the shower or by placing a wet gauze pad directly on the wound and letting it soak for several minutes. 3. It is important to gently remove any dried blood from the wound in order to encourage healing. This may be done by gently rolling a moistened Q-tip on the dried blood. Do not pick at the wound. 4. If the wound should start to bleed, continue cleaning the wound, then place a moist gauze pad on the wound and hold pressure for a few minutes.  5. Make sure you then dry the skin surrounding the wound completely or the tape will not stick to the skin. Do not use cotton balls on the wound. 6. After the wound is clean and dry, apply the ointment gently with a Q-tip. 7. Cut a non-stick pad to fit the size of the wound (or large bandaid). Lay the pad flush to the wound. If the wound is draining, you may want to reinforce it with a small amount of gauze on top of the non-stick pad for a little added compression to the area. 8. Use the tape to seal the area completely. 9. Select from the following with respect to your individual situation: 1. If your wound has been stitched closed: continue the above steps 1-8 at least daily until your sutures are removed. 2. If your wound has been left open to heal: continue steps 1-8 at least daily for the first 3-4 weeks. 10. We would like for you to take a few extra precautions for at least the next week. 1. Sleep with your head elevated on pillows if our wound is on your head. 2. Do not bend over or lift heavy items to reduce the chance of elevated blood pressure to the wound 3. Do not participate in particularly strenuous activities.   Below is a list of dressing supplies you might need.  . Cotton-tipped applicators - Q-tips . Gauze pads (2x2 and/or  4x4) - All-Purpose Sponges . Non-stick dressing material - Telfa . Tape - Paper or Hypafix . New and clean tube of petroleum jelly - Vaseline    Comments on Post-Operative Period 1. Slight swelling and redness often appear around the wound. This is normal and will disappear within several days following the surgery. 2. The healing wound will drain a brownish-red-yellow discharge during healing. This is a normal phase of wound healing. As the wound begins to heal, the drainage may increase in amount. Again, this drainage is normal. 3. Notify us if the drainage becomes persistently bloody, excessively swollen, or intensely painful or develops a foul odor or red streaks.  4. If you should experience mild discomfort during the healing phase, you may take an aspirin-free medication such as Tylenol (acetaminophen). Notify us if the discomfort is severe or persistent. Avoid alcoholic beverages when taking pain medicine.  In Case of Wound Hemorrhage A wound hemorrhage is when the bandage suddenly becomes soaked with bright red blood and flows profusely. If this happens, sit down or lie down with your head elevated. If the wound has a dressing on it, do not remove the dressing. Apply pressure to the existing gauze. If the wound is not covered, use a gauze pad to apply pressure and continue applying the pressure for 20 minutes without peeking. DO NOT COVER  THE WOUND WITH A LARGE TOWEL OR [place] CLOTH. Release your hand from the wound site but do not remove the dressing. If the bleeding has stopped, gently clean around the wound. Leave the dressing in place for 24 hours if possible. This wait time allows the blood vessels to close off so that you do not spark a new round of bleeding by disrupting the newly clotted blood vessels with an immediate dressing change. If the bleeding does not subside, continue to hold pressure. If matters are out of your control, contact an After Hours clinic or go to the Emergency  Room.

## 2019-11-29 NOTE — Progress Notes (Signed)
   Follow-Up Visit   Subjective  Veronica Butler is a 59 y.o. female who presents for the following: Nevus (Mod/severe dysplastic nevus to be excised today).  The following portions of the chart were reviewed this encounter and updated as appropriate:     Review of Systems: No other skin or systemic complaints.  Objective  Well appearing patient in no apparent distress; mood and affect are within normal limits.  A focused examination was performed including back. Relevant physical exam findings are noted in the Assessment and Plan.  Objective  Right Spinal Mid Back: Healing pink biopsy site.   Assessment & Plan  Dysplastic nevus Right Spinal Mid Back  Skin excision - Right Spinal Mid Back  Lesion length (cm):  0.6 Lesion width (cm):  0.4 Margin per side (cm):  0.2 Total excision diameter (cm):  1 Total excision diameter (cm) comment:  1.0 x 0.8 Informed consent: discussed and consent obtained   Timeout: patient name, date of birth, surgical site, and procedure verified   Procedure prep:  Patient was prepped and draped in usual sterile fashion Prep type:  Povidone-iodine Anesthesia: the lesion was anesthetized in a standard fashion   Anesthetic:  1% lidocaine w/ epinephrine 1-100,000 buffered w/ 8.4% NaHCO3 (11 cc lido/epi) Instrument used: #15 blade   Hemostasis achieved with: pressure and electrodesiccation    Skin repair - Right Spinal Mid Back Complexity:  Intermediate Final length (cm):  2 Reason for type of repair: reduce tension to allow closure   Undermining: edges could be approximated without difficulty and edges undermined   Subcutaneous layers (deep stitches):  Suture size:  3-0 Suture type: Vicryl (polyglactin 910)   Stitches:  Buried vertical mattress Fine/surface layer approximation (top stitches):  Suture size:  4-0 Suture type comment:  Nylon Stitches: simple interrupted   Suture removal (days):  7 Hemostasis achieved with: pressure Outcome:  patient tolerated procedure well with no complications   Post-procedure details: sterile dressing applied and wound care instructions given   Dressing type: pressure dressing   Additional details:  Mupirocin and pressure dressing   Specimen 1 - Surgical pathology Differential Diagnosis: Severe Dysplastic Nevus Check Margins: Yes  Tag 12:00 medial tip  Previous bx [id number]

## 2019-11-30 ENCOUNTER — Telehealth: Payer: Self-pay

## 2019-12-01 NOTE — Telephone Encounter (Signed)
Done

## 2019-12-06 ENCOUNTER — Ambulatory Visit (INDEPENDENT_AMBULATORY_CARE_PROVIDER_SITE_OTHER): Payer: 59 | Admitting: Dermatology

## 2019-12-06 ENCOUNTER — Telehealth: Payer: Self-pay

## 2019-12-06 ENCOUNTER — Other Ambulatory Visit: Payer: Self-pay

## 2019-12-06 DIAGNOSIS — L820 Inflamed seborrheic keratosis: Secondary | ICD-10-CM | POA: Diagnosis not present

## 2019-12-06 DIAGNOSIS — Z86018 Personal history of other benign neoplasm: Secondary | ICD-10-CM

## 2019-12-06 DIAGNOSIS — D225 Melanocytic nevi of trunk: Secondary | ICD-10-CM

## 2019-12-06 DIAGNOSIS — D229 Melanocytic nevi, unspecified: Secondary | ICD-10-CM

## 2019-12-06 NOTE — Patient Instructions (Signed)
Cryotherapy Aftercare  . Wash gently with soap and water everyday.   . Apply Vaseline and Band-Aid daily until healed.  

## 2019-12-06 NOTE — Progress Notes (Signed)
   Follow-Up Visit   Subjective  Veronica Butler is a 59 y.o. female who presents for the following: Growth (R mid back at braline, irritated by bra.) and Post op (Severe dysplastic nevus, margins free of the R spinal mid back).    The following portions of the chart were reviewed this encounter and updated as appropriate:     Review of Systems: No other skin or systemic complaints.  Objective  Well appearing patient in no apparent distress; mood and affect are within normal limits.  A focused examination was performed including back. Relevant physical exam findings are noted in the Assessment and Plan.  Objective  Right Spinal Mid Back: Healing excision site.  Objective  Right Mid Back at [place]: Erythematous keratotic or waxy stuck-on papule or plaque.   Objective  Back: Tan-brown symmetric macules and papules, some speckled pigment, see photo  Images            Assessment & Plan  History of dysplastic nevus Right Spinal Mid Back  Wound cleansed, sutures removed, and steri strips applied.  Inflamed seborrheic keratosis Right Mid Back at [place]  Destruction of lesion - Right Mid Back at [place]ine  Destruction method: cryotherapy   Informed consent: discussed and consent obtained   Lesion destroyed using liquid nitrogen: Yes   Region frozen until ice ball extended beyond lesion: Yes   Outcome: patient tolerated procedure well with no complications   Post-procedure details: wound care instructions given    Nevus Back  Benign-appearing.  Observation  Photos taken today.  Return in about 6 months (around 06/07/2020) for moles on back .   IJamesetta Orleans, CMA, am acting as scribe for Brendolyn Patty, MD .

## 2019-12-13 ENCOUNTER — Other Ambulatory Visit: Payer: Self-pay

## 2019-12-13 ENCOUNTER — Ambulatory Visit: Payer: 59 | Attending: Internal Medicine

## 2019-12-13 DIAGNOSIS — Z23 Encounter for immunization: Secondary | ICD-10-CM

## 2019-12-13 NOTE — Progress Notes (Signed)
   Covid-19 Vaccination Clinic  Name:  ALIANNA WURSTER    MRN: [id number] DOB: [birth year]  12/13/2019  Ms. Grieshop was observed post Covid-19 immunization for 15 minutes without incident. She was provided with Vaccine Information Sheet and instruction to access the V-Safe system.   Ms. Uncapher was instructed to call 911 with any severe reactions post vaccine: Marland Kitchen Difficulty breathing  . Swelling of face and throat  . A fast heartbeat  . A bad rash all over body  . Dizziness and weakness   Immunizations Administered    Name Date Dose VIS Date Route   Pfizer COVID-19 Vaccine 12/13/2019 11:36 AM 0.3 mL 08/27/2019 Intramuscular   Manufacturer: [place]   Lot: [id number]   [place]: [address]-7

## 2019-12-14 NOTE — Telephone Encounter (Signed)
error 

## 2020-01-05 ENCOUNTER — Ambulatory Visit: Payer: 59 | Attending: Internal Medicine

## 2020-01-05 DIAGNOSIS — Z23 Encounter for immunization: Secondary | ICD-10-CM

## 2020-01-05 NOTE — Progress Notes (Signed)
   Covid-19 Vaccination Clinic  Name:  Veronica Butler    MRN: [id number] DOB: 10-08-1960  01/05/2020  Veronica Butler was observed post Covid-19 immunization for 15 minutes without incident. She was provided with Vaccine Information Sheet and instruction to access the V-Safe system.   Veronica Butler was instructed to call 911 with any severe reactions post vaccine: Marland Kitchen Difficulty breathing  . Swelling of face and throat  . A fast heartbeat  . A bad rash all over body  . Dizziness and weakness   Immunizations Administered    Name Date Dose VIS Date Route   Pfizer COVID-19 Vaccine 01/05/2020  1:15 PM 0.3 mL 11/10/2018 Intramuscular   Manufacturer: [place]   Lot: [id number]   [place]: [address]-7

## 2020-03-05 ENCOUNTER — Other Ambulatory Visit: Payer: Self-pay | Admitting: Family Medicine

## 2020-03-05 DIAGNOSIS — E039 Hypothyroidism, unspecified: Secondary | ICD-10-CM

## 2020-05-11 ENCOUNTER — Encounter: Payer: 59 | Admitting: Family Medicine

## 2020-05-18 NOTE — Progress Notes (Signed)
I,[name],acting as a Education administrator for Lavon Paganini, MD.,have documented all relevant documentation on the behalf of Lavon Paganini, MD,as directed by  Lavon Paganini, MD while in the presence of Lavon Paganini, MD.   Complete physical exam   Patient: Veronica Butler   DOB: 06-20-1961   59 y.o. Female  MRN: [id number] Visit Date: 05/19/2020  Today's healthcare provider: Lavon Paganini, MD   Chief Complaint  Patient presents with  . Annual Exam   Subjective    Veronica Butler is a 59 y.o. female who presents today for a complete physical exam.  She reports consuming a general diet. Home exercise routine includes treadmill. She generally feels fairly well. She reports sleeping well. She does not have additional problems to discuss today.  HPI  08/18/2018 Pap-Negative [facility] 09/16/2019 Mammogram-BI-RADS [place] 10/28/2017 Colonoscopy-Normal Ophthalmology exam at [facility]  Diabetes Mellitus Type II, Follow-up  Lab Results  Component Value Date   HGBA1C 6.2 (H) 03/18/2019   HGBA1C 5.6 08/04/2018   HGBA1C 5.7 01/30/2018   Wt Readings from Last 3 Encounters:  05/19/20 229 lb 9.6 oz (104.1 kg)  08/03/18 210 lb 3.2 oz (95.3 kg)  01/30/18 195 lb (88.5 kg)   Last seen for diabetes 1 years ago.  Management since then includes no changes. She reports excellent compliance with treatment. She is not having side effects.  Symptoms: No fatigue No foot ulcerations  No appetite changes No nausea  No paresthesia of the feet  No polydipsia  No polyuria No visual disturbances   No vomiting     Home blood sugar records: fasting range: 130's  Episodes of hypoglycemia? No    Current insulin regiment: none Most Recent Eye Exam: UTD will request Current exercise: walking Current diet habits: in general, a "healthy" diet    Pertinent Labs: Lab Results  Component Value Date   CHOL 216 (H)  08/04/2018   HDL 49 08/04/2018   LDLCALC 127 (H) 08/04/2018   TRIG 198 (H) 08/04/2018   CHOLHDL 4.4 08/04/2018   Lab Results  Component Value Date   NA 144 03/18/2019   K 4.6 03/18/2019   CREATININE 0.89 03/18/2019   GFRNONAA 71 03/18/2019   GFRAA 81 03/18/2019   GLUCOSE 123 (H) 03/18/2019     ---------------------------------------------------------------------------------------------------   Past Medical History:  Diagnosis Date  . Cancer [facility]) 2002   Cervical CA with hysterectomy.  . Diabetes ([place]) 08/22/2015  . Dysplastic nevus    left spinal back, right spinal back  . Heart murmur   . Hx of dysplastic nevus 2007   multiple sites   . Hx of dysplastic nevus 2007   multiple sites   . Migraines   . PONV (postoperative nausea and vomiting)   . Sleep apnea    CPAP, recently stopped using due to 50lb weight loss  . Trigeminal neuralgia    Past Surgical History:  Procedure Laterality Date  . ABDOMINAL HYSTERECTOMY  2002   still has ovaries  . CHOLECYSTECTOMY  1998  . COLONOSCOPY WITH PROPOFOL N/A 10/28/2017   Procedure: COLONOSCOPY WITH PROPOFOL;  Surgeon: Virgel Manifold, MD;  Location: [facility];  Service: Endoscopy;  Laterality: N/A;  . HERNIA REPAIR     along with gallbladder surgery  . navel cyst  1980'   removed  . TONSILLECTOMY  at age 12   Social History   Socioeconomic History  . Marital status: Married  Spouse name: Not on file  . Number of children: Not on file  . Years of education: Not on file  . Highest education level: Not on file  Occupational History  . Not on file  Tobacco Use  . Smoking status: Never Smoker  . Smokeless tobacco: Never Used  Substance and Sexual Activity  . Alcohol use: No  . Drug use: No  . Sexual activity: Not on file  Other Topics Concern  . Not on file  Social History Narrative  . Not on file   Social Determinants of Health   Financial Resource Strain:   . Difficulty of Paying Living Expenses:  Not on file  Food Insecurity:   . Worried About Charity fundraiser in the Last Year: Not on file  . Ran Out of Food in the Last Year: Not on file  Transportation Needs:   . Lack of Transportation (Medical): Not on file  . Lack of Transportation (Non-Medical): Not on file  Physical Activity:   . Days of Exercise per Week: Not on file  . Minutes of Exercise per Session: Not on file  Stress:   . Feeling of Stress : Not on file  Social Connections:   . Frequency of Communication with Friends and Family: Not on file  . Frequency of Social Gatherings with Friends and Family: Not on file  . Attends Religious Services: Not on file  . Active Member of Clubs or Organizations: Not on file  . Attends Archivist Meetings: Not on file  . Marital Status: Not on file  Intimate Partner Violence:   . Fear of Current or Ex-Partner: Not on file  . Emotionally Abused: Not on file  . Physically Abused: Not on file  . Sexually Abused: Not on file   Family Status  Relation Name Status  . Father  Deceased  . Mother  Alive  . Sister  Alive  . MGM  Deceased   Family History  Problem Relation Age of Onset  . Cancer Father   . Alcohol abuse Father   . Hypertension Mother   . Sarcoidosis Sister   . Cancer Maternal Grandmother        colon  . Dementia Maternal Grandmother    Allergies  Allergen Reactions  . Cortisone Other (See Comments)    High blood sugar  . Tape Swelling and Dermatitis    Redness and itching.  . Ciprofloxacin Rash  . Floxacillin [Flucloxacillin] Rash  . Levaquin [Levofloxacin In D5w] Rash  . Quinolones Rash    Tendon pain as well as rash    Patient Care Team: Virginia Crews, MD as PCP - General (Family Medicine)   Medications: Outpatient Medications Prior to Visit  Medication Sig  . ALPHA LIPOIC ACID PO Take by mouth.  . Blood Glucose Monitoring Suppl (ONE TOUCH ULTRA SYSTEM KIT) W/DEVICE KIT GLUCOMETER TEST STRIPS - Historical Medication  Check  blood sugar daily ---- ULTRA ONE TOUCH  Started 11-Jul-2009 Active Comments: DX: 790.29  . carbamazepine (TEGRETOL XR) 100 MG 12 hr tablet TAKE 1 TABLET BY MOUTH  DAILY  . Cranberry 500 MG CAPS Take 1 capsule by mouth daily.  Marland Kitchen DIGESTIVE ENZYMES PO Take 1 capsule by mouth daily.  . Magnesium 500 MG CAPS Take 2 tablets by mouth daily.   . Misc Natural Products (ADRENAL) 200 MG CAPS Take 1 capsule by mouth 2 (two) times daily.   . MULTIPLE VITAMIN PO Take 3 tablets by mouth daily.   Marland Kitchen  Potassium 99 MG TABS Take by mouth.  . psyllium (REGULOID) 0.52 g capsule Take 0.52 g by mouth 2 (two) times daily.  Marland Kitchen SYNTHROID 25 MCG tablet TAKE 1 TABLET BY MOUTH  DAILY BEFORE BREAKFAST AND  2 TABLETS ON THE WEEKENDS  . [DISCONTINUED] docusate sodium (STOOL SOFTENER) 100 MG capsule Take 100 mg by mouth 2 (two) times daily.  . [DISCONTINUED] Phosphatidylserine-DHA-EPA 100-19.5-6.5 MG CAPS Take by mouth.   No facility-administered medications prior to visit.    Review of Systems  Constitutional: Negative.   HENT: Negative.   Eyes: Negative.   Respiratory: Negative.   Cardiovascular: Negative.   Gastrointestinal: Negative.   Endocrine: Negative.   Genitourinary: Negative.   Musculoskeletal: Negative.   Skin: Negative.   Allergic/Immunologic: Negative.   Neurological: Negative.   Hematological: Negative.   Psychiatric/Behavioral: Negative.     Last CBC Lab Results  Component Value Date   WBC 9.3 08/04/2017   HGB 14.2 08/04/2017   HCT 42.0 08/04/2017   MCV 89.4 08/04/2017   MCH 30.2 08/04/2017   RDW 11.9 08/04/2017   PLT 251 35/36/1443   Last metabolic panel Lab Results  Component Value Date   GLUCOSE 123 (H) 03/18/2019   NA 144 03/18/2019   K 4.6 03/18/2019   CL 101 03/18/2019   CO2 23 03/18/2019   BUN 14 03/18/2019   CREATININE 0.89 03/18/2019   GFRNONAA 71 03/18/2019   GFRAA 81 03/18/2019   CALCIUM 9.5 03/18/2019   PHOS 3.5 03/18/2019   PROT 6.4 08/04/2018   ALBUMIN 4.7  03/18/2019   LABGLOB 1.9 08/04/2018   AGRATIO 2.4 (H) 08/04/2018   BILITOT 0.4 08/04/2018   ALKPHOS 76 08/04/2018   AST 25 08/04/2018   ALT 18 08/04/2018   Last lipids Lab Results  Component Value Date   CHOL 216 (H) 08/04/2018   HDL 49 08/04/2018   LDLCALC 127 (H) 08/04/2018   TRIG 198 (H) 08/04/2018   CHOLHDL 4.4 08/04/2018   Last hemoglobin A1c Lab Results  Component Value Date   HGBA1C 6.2 (H) 03/18/2019   Last thyroid functions Lab Results  Component Value Date   TSH 1.820 03/18/2019   Last vitamin B12 and Folate Lab Results  Component Value Date   VITAMINB12 1,064 08/04/2018      Objective    BP 132/88 (BP Location: Left Arm, Patient Position: Sitting, Cuff Size: Large)   Pulse 76   Temp 98.2 F (36.8 C) (Oral)   Resp 16   Ht 5' 6"  (1.676 m)   Wt 229 lb 9.6 oz (104.1 kg)   LMP 11/05/2000   BMI 37.06 kg/m  BP Readings from Last 3 Encounters:  05/19/20 132/88  03/17/19 118/80  08/03/18 129/87   Wt Readings from Last 3 Encounters:  05/19/20 229 lb 9.6 oz (104.1 kg)  08/03/18 210 lb 3.2 oz (95.3 kg)  01/30/18 195 lb (88.5 kg)      Physical Exam Vitals reviewed.  Constitutional:      General: She is not in acute distress.    Appearance: Normal appearance. She is well-developed. She is not diaphoretic.  HENT:     Head: Normocephalic and atraumatic.     Right Ear: Tympanic membrane, ear canal and external ear normal.     Left Ear: Tympanic membrane, ear canal and external ear normal.     Nose: Nose normal.     Mouth/Throat:     Mouth: Mucous membranes are moist.     Pharynx: Oropharynx is clear. No  oropharyngeal exudate.  Eyes:     General: No scleral icterus.    Conjunctiva/sclera: Conjunctivae normal.     Pupils: Pupils are equal, round, and reactive to light.  Neck:     Thyroid: No thyromegaly.  Cardiovascular:     Rate and Rhythm: Normal rate and regular rhythm.     Pulses: Normal pulses.     Heart sounds: Normal heart sounds. No  murmur heard.   Pulmonary:     Effort: Pulmonary effort is normal. No respiratory distress.     Breath sounds: Normal breath sounds. No wheezing or rales.  Abdominal:     General: There is no distension.     Palpations: Abdomen is soft.     Tenderness: There is no abdominal tenderness.  Musculoskeletal:        General: No deformity.     Cervical back: Neck supple.     Right lower leg: No edema.     Left lower leg: No edema.  Lymphadenopathy:     Cervical: No cervical adenopathy.  Skin:    General: Skin is warm and dry.     Findings: No rash.  Neurological:     Mental Status: She is alert and oriented to person, place, and time. Mental status is at baseline.     Sensory: No sensory deficit.     Motor: No weakness.     Gait: Gait normal.  Psychiatric:        Mood and Affect: Mood normal.        Behavior: Behavior normal.        Thought Content: Thought content normal.      Last depression screening scores PHQ 2/9 Scores 05/19/2020 08/03/2018 08/01/2017  PHQ - 2 Score 0 0 0  PHQ- 9 Score - 2 0   Last fall risk screening Fall Risk  05/19/2020  Falls in the past year? 0  Number falls in past yr: 0  Injury with Fall? 0  Risk for fall due to : No Fall Risks  Follow up Falls evaluation completed   Last Audit-C alcohol use screening Alcohol Use Disorder Test (AUDIT) 05/19/2020  1. How often do you have a drink containing alcohol? 0  2. How many drinks containing alcohol do you have on a typical day when you are drinking? 0  3. How often do you have six or more drinks on one occasion? 0  AUDIT-C Score 0  Alcohol Brief Interventions/Follow-up AUDIT Score <7 follow-up not indicated   A score of 3 or more in women, and 4 or more in men indicates increased risk for alcohol abuse, EXCEPT if all of the points are from question 1   Results for orders placed or performed in visit on 05/19/20  HM MAMMOGRAPHY  Result Value Ref Range   HM Mammogram 0-4 Bi-Rad 0-4 Bi-Rad, Self Reported  Normal  POCT UA - Microalbumin  Result Value Ref Range   Microalbumin Ur, POC 20 mg/L    Assessment & Plan    Routine Health Maintenance and Physical Exam  Exercise Activities and Dietary recommendations Goals   None     Immunization History  Administered Date(s) Administered  . Influenza,inj,Quad PF,6+ Mos 06/01/2015, 08/01/2017, 07/17/2018, 05/19/2020  . PFIZER SARS-COV-2 Vaccination 12/13/2019, 01/05/2020  . Pneumococcal Polysaccharide-23 08/01/2017  . Td 01/30/2018  . Tdap 08/19/2007    Health Maintenance  Topic Date Due  . OPHTHALMOLOGY EXAM  09/15/2019  . HEMOGLOBIN A1C  09/18/2019  . MAMMOGRAM  09/15/2020  . FOOT  EXAM  05/19/2021  . URINE MICROALBUMIN  05/19/2021  . PAP SMEAR-Modifier  08/20/2021  . COLONOSCOPY  10/29/2027  . TETANUS/TDAP  01/31/2028  . INFLUENZA VACCINE  Completed  . PNEUMOCOCCAL POLYSACCHARIDE VACCINE AGE 51-64 HIGH RISK  Completed  . COVID-19 Vaccine  Completed  . Hepatitis C Screening  Completed  . HIV Screening  Completed    Discussed health benefits of physical activity, and encouraged her to engage in regular exercise appropriate for her age and condition.  Problem List Items Addressed This Visit      Digestive   NASH (nonalcoholic steatohepatitis)    Continue to monitor annual LFTs        Endocrine   Hyperlipidemia associated with type 2 diabetes mellitus ([place])    Not currently on a statin Discussed importance of statin for secondary prevention in the setting of diabetes Goal LDL less than 70 Recheck CMP and lipid panel      Diabetes ([place])    A1c ordered today Continue current medications UTD on vaccines, eye exam, requested from [facility], foot exam Discussed diet and exercise F/u in 6 months, pending lab results       Relevant Orders   POCT UA - Microalbumin (Completed)   Hypothyroidism    Previously well controlled Continue Synthroid at current dose  Recheck TSH and adjust Synthroid as indicated         Multinodular thyroid    Repeat thyroid ultrasound      Relevant Orders   US THYROID     Nervous and Auditory   Trigeminal neuralgia    Well-controlled Continue Tegretol per neurology Recheck LFTs annually        Other   B12 deficiency    Recheck B12 level      Relevant Orders   Vitamin B12   Morbid obesity ([place])    BMI 37, associated with T2DM, hyperlipidemia, Karlene Lineman, GERD Discussed importance of healthy weight management Discussed diet and exercise        Other Visit Diagnoses    Encounter for annual physical exam    -  Primary   Relevant Orders   Comprehensive metabolic panel   Hemoglobin A1c   Lipid Panel With LDL/HDL Ratio   TSH   Need for influenza vaccination       Relevant Orders   Flu Vaccine QUAD 36+ mos IM (Completed)      ROI sent for last Pap smear and last eye exam  Return in about 6 months (around 11/16/2020) for chronic disease f/u.     I, Lavon Paganini, MD, have reviewed all documentation for this visit. The documentation on 05/19/20 for the exam, diagnosis, procedures, and orders are all accurate and complete.   Jarrah Seher, Dionne Bucy, MD, MPH [facility]

## 2020-05-19 ENCOUNTER — Encounter: Payer: Self-pay | Admitting: Family Medicine

## 2020-05-19 ENCOUNTER — Ambulatory Visit (INDEPENDENT_AMBULATORY_CARE_PROVIDER_SITE_OTHER): Payer: 59 | Admitting: Family Medicine

## 2020-05-19 ENCOUNTER — Other Ambulatory Visit: Payer: Self-pay

## 2020-05-19 VITALS — BP 132/88 | HR 76 | Temp 98.2°F | Resp 16 | Ht 66.0 in | Wt 229.6 lb

## 2020-05-19 DIAGNOSIS — G500 Trigeminal neuralgia: Secondary | ICD-10-CM

## 2020-05-19 DIAGNOSIS — E1142 Type 2 diabetes mellitus with diabetic polyneuropathy: Secondary | ICD-10-CM | POA: Diagnosis not present

## 2020-05-19 DIAGNOSIS — Z23 Encounter for immunization: Secondary | ICD-10-CM | POA: Diagnosis not present

## 2020-05-19 DIAGNOSIS — E538 Deficiency of other specified B group vitamins: Secondary | ICD-10-CM | POA: Diagnosis not present

## 2020-05-19 DIAGNOSIS — E1169 Type 2 diabetes mellitus with other specified complication: Secondary | ICD-10-CM

## 2020-05-19 DIAGNOSIS — E042 Nontoxic multinodular goiter: Secondary | ICD-10-CM | POA: Insufficient documentation

## 2020-05-19 DIAGNOSIS — E039 Hypothyroidism, unspecified: Secondary | ICD-10-CM

## 2020-05-19 DIAGNOSIS — K7581 Nonalcoholic steatohepatitis (NASH): Secondary | ICD-10-CM

## 2020-05-19 DIAGNOSIS — Z0000 Encounter for general adult medical examination without abnormal findings: Secondary | ICD-10-CM | POA: Diagnosis not present

## 2020-05-19 DIAGNOSIS — E785 Hyperlipidemia, unspecified: Secondary | ICD-10-CM

## 2020-05-19 LAB — POCT UA - MICROALBUMIN: Microalbumin Ur, POC: 20 mg/L

## 2020-05-19 NOTE — Assessment & Plan Note (Signed)
Continue to monitor annual LFTs

## 2020-05-19 NOTE — Assessment & Plan Note (Signed)
Recheck B12 level 

## 2020-05-19 NOTE — Patient Instructions (Signed)
Preventive Care 40-61 Years Old, Female Preventive care refers to visits with your health care provider and lifestyle choices that can promote health and wellness. This includes:  A yearly physical exam. This may also be called an annual well check.  Regular dental visits and eye exams.  Immunizations.  Screening for certain conditions.  Healthy lifestyle choices, such as eating a healthy diet, getting regular exercise, not using drugs or products that contain nicotine and tobacco, and limiting alcohol use. What can I expect for my preventive care visit? Physical exam Your health care provider will check your:  Height and weight. This may be used to calculate body mass index (BMI), which tells if you are at a healthy weight.  Heart rate and blood pressure.  Skin for abnormal spots. Counseling Your health care provider may ask you questions about your:  Alcohol, tobacco, and drug use.  Emotional well-being.  Home and relationship well-being.  Sexual activity.  Eating habits.  Work and work environment.  Method of birth control.  Menstrual cycle.  Pregnancy history. What immunizations do I need?  Influenza (flu) vaccine  This is recommended every year. Tetanus, diphtheria, and pertussis (Tdap) vaccine  You may need a Td booster every 10 years. Varicella (chickenpox) vaccine  You may need this if you have not been vaccinated. Zoster (shingles) vaccine  You may need this after age 60. Measles, mumps, and rubella (MMR) vaccine  You may need at least one dose of MMR if you were born in 1957 or later. You may also need a second dose. Pneumococcal conjugate (PCV13) vaccine  You may need this if you have certain conditions and were not previously vaccinated. Pneumococcal polysaccharide (PPSV23) vaccine  You may need one or two doses if you smoke cigarettes or if you have certain conditions. Meningococcal conjugate (MenACWY) vaccine  You may need this if you  have certain conditions. Hepatitis A vaccine  You may need this if you have certain conditions or if you travel or work in places where you may be exposed to hepatitis A. Hepatitis B vaccine  You may need this if you have certain conditions or if you travel or work in places where you may be exposed to hepatitis B. Haemophilus influenzae type b (Hib) vaccine  You may need this if you have certain conditions. Human papillomavirus (HPV) vaccine  If recommended by your health care provider, you may need three doses over 6 months. You may receive vaccines as individual doses or as more than one vaccine together in one shot (combination vaccines). Talk with your health care provider about the risks and benefits of combination vaccines. What tests do I need? Blood tests  Lipid and cholesterol levels. These may be checked every 5 years, or more frequently if you are over 50 years old.  Hepatitis C test.  Hepatitis B test. Screening  Lung cancer screening. You may have this screening every year starting at age 55 if you have a 30-pack-year history of smoking and currently smoke or have quit within the past 15 years.  Colorectal cancer screening. All adults should have this screening starting at age 50 and continuing until age 75. Your health care provider may recommend screening at age 45 if you are at increased risk. You will have tests every 1-10 years, depending on your results and the type of screening test.  Diabetes screening. This is done by checking your blood sugar (glucose) after you have not eaten for a while (fasting). You may have this   done every 1-3 years.  Mammogram. This may be done every 1-2 years. Talk with your health care provider about when you should start having regular mammograms. This may depend on whether you have a family history of breast cancer.  BRCA-related cancer screening. This may be done if you have a family history of breast, ovarian, tubal, or peritoneal  cancers.  Pelvic exam and Pap test. This may be done every 3 years starting at age 21. Starting at age 30, this may be done every 5 years if you have a Pap test in combination with an HPV test. Other tests  Sexually transmitted disease (STD) testing.  Bone density scan. This is done to screen for osteoporosis. You may have this scan if you are at high risk for osteoporosis. Follow these instructions at home: Eating and drinking  Eat a diet that includes fresh fruits and vegetables, whole grains, lean protein, and low-fat dairy.  Take vitamin and mineral supplements as recommended by your health care provider.  Do not drink alcohol if: ? Your health care provider tells you not to drink. ? You are pregnant, may be pregnant, or are planning to become pregnant.  If you drink alcohol: ? Limit how much you have to 0-1 drink a day. ? Be aware of how much alcohol is in your drink. In the [place] one drink equals one 12 oz bottle of beer (355 mL), one 5 oz glass of wine (148 mL), or one 1 oz glass of hard liquor (44 mL). Lifestyle  Take daily care of your teeth and gums.  Stay active. Exercise for at least 30 minutes on 5 or more days each week.  Do not use any products that contain nicotine or tobacco, such as cigarettes, e-cigarettes, and chewing tobacco. If you need help quitting, ask your health care provider.  If you are sexually active, practice safe sex. Use a condom or other form of birth control (contraception) in order to prevent pregnancy and STIs (sexually transmitted infections).  If told by your health care provider, take low-dose aspirin daily starting at age 50. What's next?  Visit your health care provider once a year for a well check visit.  Ask your health care provider how often you should have your eyes and teeth checked.  Stay up to date on all vaccines. This information is not intended to replace advice given to you by your health care provider. Make sure you  discuss any questions you have with your health care provider. Document Revised: 05/14/2018 Document Reviewed: 05/14/2018 Elsevier Patient Education  2020 Elsevier Inc.  Influenza (Flu) Vaccine (Inactivated or Recombinant): What You Need to Know 1. Why get vaccinated? Influenza vaccine can prevent influenza (flu). Flu is a contagious disease that spreads around the [place] every year, usually between October and May. Anyone can get the flu, but it is more dangerous for some people. Infants and young children, people 65 years of age and older, pregnant women, and people with certain health conditions or a weakened immune system are at greatest risk of flu complications. Pneumonia, bronchitis, sinus infections and ear infections are examples of flu-related complications. If you have a medical condition, such as heart disease, cancer or diabetes, flu can make it worse. Flu can cause fever and chills, sore throat, muscle aches, fatigue, cough, headache, and runny or stuffy nose. Some people may have vomiting and diarrhea, though this is more common in children than adults. Each year thousands of people in the [place] die   from flu, and many more are hospitalized. Flu vaccine prevents millions of illnesses and flu-related visits to the doctor each year. 2. Influenza vaccine CDC recommends everyone 6 months of age and older get vaccinated every flu season. Children 6 months through 8 years of age may need 2 doses during a single flu season. Everyone else needs only 1 dose each flu season. It takes about 2 weeks for protection to develop after vaccination. There are many flu viruses, and they are always changing. Each year a new flu vaccine is made to protect against three or four viruses that are likely to cause disease in the upcoming flu season. Even when the vaccine doesn't exactly match these viruses, it may still provide some protection. Influenza vaccine does not cause flu. Influenza  vaccine may be given at the same time as other vaccines. 3. Talk with your health care provider Tell your vaccine provider if the person getting the vaccine:  Has had an allergic reaction after a previous dose of influenza vaccine, or has any severe, life-threatening allergies.  Has ever had Guillain-Barr Syndrome (also called GBS). In some cases, your health care provider may decide to postpone influenza vaccination to a future visit. People with minor illnesses, such as a cold, may be vaccinated. People who are moderately or severely ill should usually wait until they recover before getting influenza vaccine. Your health care provider can give you more information. 4. Risks of a vaccine reaction  Soreness, redness, and swelling where shot is given, fever, muscle aches, and headache can happen after influenza vaccine.  There may be a very small increased risk of Guillain-Barr Syndrome (GBS) after inactivated influenza vaccine (the flu shot). Young children who get the flu shot along with pneumococcal vaccine (PCV13), and/or DTaP vaccine at the same time might be slightly more likely to have a seizure caused by fever. Tell your health care provider if a child who is getting flu vaccine has ever had a seizure. People sometimes faint after medical procedures, including vaccination. Tell your provider if you feel dizzy or have vision changes or ringing in the ears. As with any medicine, there is a very remote chance of a vaccine causing a severe allergic reaction, other serious injury, or death. 5. What if there is a serious problem? An allergic reaction could occur after the vaccinated person leaves the clinic. If you see signs of a severe allergic reaction (hives, swelling of the face and throat, difficulty breathing, a fast heartbeat, dizziness, or weakness), call 9-1-1 and get the person to the nearest hospital. For other signs that concern you, call your health care provider. Adverse  reactions should be reported to the Vaccine Adverse Event Reporting System (VAERS). Your health care provider will usually file this report, or you can do it yourself. Visit the VAERS website at www.vaers.hhs.gov or call 1-800-822-7967.VAERS is only for reporting reactions, and VAERS staff do not give medical advice. 6. The National Vaccine Injury Compensation Program The National Vaccine Injury Compensation Program (VICP) is a federal program that was created to compensate people who may have been injured by certain vaccines. Visit the VICP website at www.hrsa.gov/vaccinecompensation or call 1-800-338-2382 to learn about the program and about filing a claim. There is a time limit to file a claim for compensation. 7. How can I learn more?  Ask your healthcare provider.  Call your local or state health department.  Contact the Centers for Disease Control and Prevention (CDC): ? Call 1-800-232-4636 (1-800-CDC-INFO) or ? Visit CDC's   www.cdc.gov/flu Vaccine Information Statement (Interim) Inactivated Influenza Vaccine (04/30/2018) This information is not intended to replace advice given to you by your health care provider. Make sure you discuss any questions you have with your health care provider. Document Revised: 12/22/2018 Document Reviewed: 05/04/2018 Elsevier Patient Education  2020 Elsevier Inc.  

## 2020-05-19 NOTE — Assessment & Plan Note (Signed)
Repeat thyroid ultrasound

## 2020-05-19 NOTE — Assessment & Plan Note (Signed)
BMI 37, associated with T2DM, hyperlipidemia, Veronica Butler, GERD Discussed importance of healthy weight management Discussed diet and exercise

## 2020-05-19 NOTE — Assessment & Plan Note (Signed)
Well-controlled Continue Tegretol per neurology Recheck LFTs annually

## 2020-05-19 NOTE — Assessment & Plan Note (Signed)
A1c ordered today Continue current medications UTD on vaccines, eye exam, requested from [facility], foot exam Discussed diet and exercise F/u in 6 months, pending lab results

## 2020-05-19 NOTE — Assessment & Plan Note (Signed)
Not currently on a statin Discussed importance of statin for secondary prevention in the setting of diabetes Goal LDL less than 70 Recheck CMP and lipid panel

## 2020-05-19 NOTE — Assessment & Plan Note (Signed)
Previously well controlled Continue Synthroid at current dose  Recheck TSH and adjust Synthroid as indicated   

## 2020-05-25 LAB — COMPREHENSIVE METABOLIC PANEL
ALT: 31 IU/L (ref 0–32)
AST: 35 IU/L (ref 0–40)
Albumin/Globulin Ratio: 2 (ref 1.2–2.2)
Albumin: 4.4 g/dL (ref 3.8–4.8)
Alkaline Phosphatase: 76 IU/L (ref 48–121)
BUN/Creatinine Ratio: 21 (ref 12–28)
BUN: 15 mg/dL (ref 8–27)
Bilirubin Total: 0.4 mg/dL (ref 0.0–1.2)
CO2: 19 mmol/L — ABNORMAL LOW (ref 20–29)
Calcium: 9.2 mg/dL (ref 8.7–10.3)
Chloride: 105 mmol/L (ref 96–106)
Creatinine, Ser: 0.72 mg/dL (ref 0.57–1.00)
GFR calc Af Amer: 105 mL/min/{1.73_m2} (ref >59)
GFR calc non Af Amer: 91 mL/min/{1.73_m2} (ref >59)
Globulin, Total: 2.2 g/dL (ref 1.5–4.5)
Glucose: 143 mg/dL — ABNORMAL HIGH (ref 65–99)
Potassium: 4.6 mmol/L (ref 3.5–5.2)
Sodium: 141 mmol/L (ref 134–144)
Total Protein: 6.6 g/dL (ref 6.0–8.5)

## 2020-05-25 LAB — LIPID PANEL WITH LDL/HDL RATIO
Cholesterol, Total: 221 mg/dL — ABNORMAL HIGH (ref 100–199)
HDL: 45 mg/dL (ref >39)
LDL Chol Calc (NIH): 136 mg/dL — ABNORMAL HIGH (ref 0–99)
LDL/HDL Ratio: 3 ratio (ref 0.0–3.2)
Triglycerides: 225 mg/dL — ABNORMAL HIGH (ref 0–149)
VLDL Cholesterol Cal: 40 mg/dL (ref 5–40)

## 2020-05-25 LAB — TSH: TSH: 1.51 u[IU]/mL (ref 0.450–4.500)

## 2020-05-25 LAB — HEMOGLOBIN A1C
Est. average glucose Bld gHb Est-mCnc: 143 mg/dL
Hgb A1c MFr Bld: 6.6 % — ABNORMAL HIGH (ref 4.8–5.6)

## 2020-05-25 LAB — VITAMIN B12: Vitamin B-12: 1269 pg/mL — ABNORMAL HIGH (ref 232–1245)

## 2020-05-29 ENCOUNTER — Other Ambulatory Visit: Payer: Self-pay | Admitting: Family Medicine

## 2020-05-29 ENCOUNTER — Telehealth: Payer: Self-pay

## 2020-05-29 DIAGNOSIS — E039 Hypothyroidism, unspecified: Secondary | ICD-10-CM

## 2020-05-29 NOTE — Telephone Encounter (Signed)
Requested Prescriptions  Pending Prescriptions Disp Refills   SYNTHROID 25 MCG tablet [Pharmacy Med Name: SYNTHROID  25MCG  TAB] 117 tablet 3    Sig: TAKE 1 TABLET BY MOUTH  DAILY BEFORE BREAKFAST AND  2 TABLETS BY MOUTH ON THE  WEEKENDS     Endocrinology:  Hypothyroid Agents Failed - 05/29/2020  9:50 PM      Failed - TSH needs to be rechecked within 3 months after an abnormal result. Refill until TSH is due.      Passed - TSH in normal range and within 360 days    TSH  Date Value Ref Range Status  05/24/2020 1.510 0.450 - 4.500 uIU/mL Final         Passed - Valid encounter within last 12 months    Recent Outpatient Visits          1 week ago Encounter for annual physical exam   [facility], Dionne Bucy, MD   1 year ago Type 2 diabetes mellitus with diabetic polyneuropathy, without long-term current use of insulin [facility])   [facility], Dionne Bucy, MD   1 year ago Encounter for annual physical exam   [facility] [place], Dionne Bucy, MD   2 years ago Type 2 diabetes mellitus with stage 3 chronic kidney disease, without long-term current use of insulin [facility])   [facility] [place], Dionne Bucy, MD   2 years ago Type 2 diabetes mellitus without complication, without long-term current use of insulin [facility])   [facility], Dionne Bucy, MD      Future Appointments            In 1 month Brendolyn Patty, MD [place]   In 5 months Bacigalupo, Dionne Bucy, MD [facility], [place]

## 2020-05-29 NOTE — Telephone Encounter (Signed)
-----   Message from Virginia Crews, MD sent at 05/29/2020  1:19 PM EDT ----- Normal labs, except A1c has increased from 6.2-6.6.  This is still at goal of less than 7.  Cholesterol is high.  Goal LDL, bad cholesterol, in patients with diabetes is less than 70.  I would strongly recommend a statin medication to lower risk of heart disease and stroke.  We could start with something low-dose if patient is agreeable.  Vitamin B12 is slightly elevated, recommend decreasing her supplement by half if she is taking one.

## 2020-05-29 NOTE — Telephone Encounter (Signed)
Pt advised.  She does not want to start a statin at this time.  She is going to restart fish oil and work on lifestyle changes.    Thanks,   -Mickel Baas

## 2020-05-30 ENCOUNTER — Ambulatory Visit
Admission: RE | Admit: 2020-05-30 | Discharge: 2020-05-30 | Disposition: A | Discharge status: Home / Self Care | Payer: 59 | Source: Ambulatory Visit | Attending: Family Medicine | Admitting: Family Medicine

## 2020-05-30 ENCOUNTER — Other Ambulatory Visit: Payer: Self-pay

## 2020-05-30 DIAGNOSIS — E042 Nontoxic multinodular goiter: Secondary | ICD-10-CM | POA: Diagnosis present

## 2020-05-31 ENCOUNTER — Encounter: Payer: Self-pay | Admitting: Family Medicine

## 2020-06-06 ENCOUNTER — Ambulatory Visit: Payer: 59 | Admitting: Dermatology

## 2020-06-07 ENCOUNTER — Ambulatory Visit: Payer: 59 | Admitting: Family Medicine

## 2020-07-24 ENCOUNTER — Ambulatory Visit (INDEPENDENT_AMBULATORY_CARE_PROVIDER_SITE_OTHER): Payer: 59 | Admitting: Dermatology

## 2020-07-24 ENCOUNTER — Other Ambulatory Visit: Payer: Self-pay

## 2020-07-24 DIAGNOSIS — L820 Inflamed seborrheic keratosis: Secondary | ICD-10-CM | POA: Diagnosis not present

## 2020-07-24 DIAGNOSIS — Z86018 Personal history of other benign neoplasm: Secondary | ICD-10-CM

## 2020-07-24 DIAGNOSIS — D492 Neoplasm of unspecified behavior of bone, soft tissue, and skin: Secondary | ICD-10-CM

## 2020-07-24 DIAGNOSIS — D229 Melanocytic nevi, unspecified: Secondary | ICD-10-CM | POA: Diagnosis not present

## 2020-07-24 DIAGNOSIS — D485 Neoplasm of uncertain behavior of skin: Secondary | ICD-10-CM | POA: Diagnosis not present

## 2020-07-24 NOTE — Progress Notes (Signed)
   Follow-Up Visit   Subjective  Veronica Butler is a 59 y.o. female who presents for the following: mole check (back, 33mf/u, hx of Dysplastic Nevi) and check spot (scalp, itchy).  She had severe dysplastic removed last visit at R spinal mid back.   The following portions of the chart were reviewed this encounter and updated as appropriate:      Review of Systems:  No other skin or systemic complaints except as noted in HPI or Assessment and Plan.  Objective  Well appearing patient in no apparent distress; mood and affect are within normal limits.  A focused examination was performed including back, arms, scalp. Relevant physical exam findings are noted in the Assessment and Plan.  Objective  Right spinal mid upper back: 3.063mmed dark brown pap with irregular pigment       Objective  crown scalp x 1: Erythematous keratotic or waxy stuck-on papule    Assessment & Plan    History of Dysplastic Nevi - No evidence of recurrence today R spinal mid back - Recommend regular full body skin exams - Recommend daily broad spectrum sunscreen SPF 30+ to sun-exposed areas, reapply every 2 hours as needed.  - Call if any new or changing lesions are noted between office visits  Melanocytic Nevi - Tan-brown and/or pink-flesh-colored symmetric macules and papules - Benign appearing on exam today - Observation - Call clinic for new or changing moles - Recommend daily use of broad spectrum spf 30+ sunscreen to sun-exposed areas.    Neoplasm of skin Right spinal mid upper back  Epidermal / dermal shaving  Lesion diameter (cm):  0.3 Informed consent: discussed and consent obtained   Timeout: patient name, date of birth, surgical site, and procedure verified   Procedure prep:  Patient was prepped and draped in usual sterile fashion Prep type:  Isopropyl alcohol Anesthesia: the lesion was anesthetized in a standard fashion   Anesthetic:  1% lidocaine w/ epinephrine 1-100,000  buffered w/ 8.4% NaHCO3 Instrument used: flexible razor blade   Hemostasis achieved with: pressure, aluminum chloride and electrodesiccation   Outcome: patient tolerated procedure well   Post-procedure details: sterile dressing applied and wound care instructions given   Dressing type: bandage and petrolatum   Additional details:  Post txt defect 0.7cm  Specimen 1 - Surgical pathology Differential Diagnosis: D48.5 Nevus vs Dysplastic Nevus Check Margins: No 3.62m45med dark brown pap with irregular pigment  Inflamed seborrheic keratosis crown scalp x 1  Destruction of lesion - crown scalp x 1  Destruction method: cryotherapy   Informed consent: discussed and consent obtained   Lesion destroyed using liquid nitrogen: Yes   Region frozen until ice ball extended beyond lesion: Yes   Outcome: patient tolerated procedure well with no complications   Post-procedure details: wound care instructions given    Return for TBSE, hx of dysplastic nevi.   I, SonOthelia PullingMA, am acting as scribe for TarBrendolyn PattyD . Documentation: I have reviewed the above documentation for accuracy and completeness, and I agree with the above.  TarBrendolyn Patty

## 2020-07-24 NOTE — Patient Instructions (Signed)

## 2020-07-26 ENCOUNTER — Telehealth: Payer: Self-pay

## 2020-07-26 NOTE — Telephone Encounter (Signed)
-----   Message from Brendolyn Patty, MD sent at 07/25/2020  6:46 PM EST ----- Skin , right spinal mid upper back PIGMENTED SEBORRHEIC KERATOSIS  Benign age related growth

## 2020-07-26 NOTE — Telephone Encounter (Signed)
Advised patient biopsy was benign.  

## 2020-08-22 ENCOUNTER — Other Ambulatory Visit: Payer: Self-pay | Admitting: Family Medicine

## 2020-08-22 DIAGNOSIS — G500 Trigeminal neuralgia: Secondary | ICD-10-CM

## 2020-08-23 NOTE — Telephone Encounter (Signed)
Requested medication (s) are due for refill today: Due 09/26/20  Requested medication (s) are on the active medication list: yes  Last refill: 09/27/19 #90  3 refills  Future visit scheduled yes 11/16/20  Notes to clinic:not delegated  Requested Prescriptions  Pending Prescriptions Disp Refills   carbamazepine (TEGRETOL XR) 100 MG 12 hr tablet [Pharmacy Med Name: CARBAMAZEPINE  100MG  TAB  EXTENDED RELEASE] 90 tablet 3    Sig: TAKE 1 TABLET BY MOUTH  DAILY      Not Delegated - Neurology:  Anticonvulsants - carbamazepine Failed - 08/22/2020 10:22 PM      Failed - This refill cannot be delegated      Failed - Carbamazepine (serum) in normal range and within 360 days    No results found for: CBMZ, LABCARB        Failed - WBC in normal range and within 90 days    WBC  Date Value Ref Range Status  08/04/2017 9.3 3.8 - 10.8 Thousand/uL Final          Failed - PLT in normal range and within 90 days    Platelets  Date Value Ref Range Status  08/04/2017 251 140 - 400 Thousand/uL Final  01/16/2016 254 150 - 379 x10E3/uL Final          Failed - HGB in normal range and within 90 days    Hemoglobin  Date Value Ref Range Status  08/04/2017 14.2 11.7 - 15.5 g/dL Final  01/16/2016 14.6 11.1 - 15.9 g/dL Final          Failed - HCT in normal range and within 90 days    HCT  Date Value Ref Range Status  08/04/2017 42.0 35 - 45 % Final   Hematocrit  Date Value Ref Range Status  01/16/2016 43.4 34.0 - 46.6 % Final          Passed - AST in normal range and within 90 days    AST  Date Value Ref Range Status  05/24/2020 35 0 - 40 IU/L Final          Passed - ALT in normal range and within 90 days    ALT  Date Value Ref Range Status  05/24/2020 31 0 - 32 IU/L Final          Passed - Na in normal range and within 90 days    Sodium  Date Value Ref Range Status  05/24/2020 141 134 - 144 mmol/L Final          Passed - Valid encounter within last 12 months    Recent  Outpatient Visits           3 months ago Encounter for annual physical exam   [facility], Dionne Bucy, MD   1 year ago Type 2 diabetes mellitus with diabetic polyneuropathy, without long-term current use of insulin ([place])   [place], Dionne Bucy, MD   2 years ago Encounter for annual physical exam   [facility] [place], Dionne Bucy, MD   2 years ago Type 2 diabetes mellitus with stage 3 chronic kidney disease, without long-term current use of insulin [facility])   [place], Dionne Bucy, MD   3 years ago Type 2 diabetes mellitus without complication, without long-term current use of insulin [facility])   [facility], Dionne Bucy, MD       Future Appointments  In 2 months Bacigalupo, Dionne Bucy, MD [facility], [place]   In 5 months Brendolyn Patty, MD [place]

## 2020-10-20 LAB — HM DIABETES EYE EXAM

## 2020-10-27 ENCOUNTER — Ambulatory Visit (INDEPENDENT_AMBULATORY_CARE_PROVIDER_SITE_OTHER): Payer: 59 | Admitting: Podiatry

## 2020-10-27 ENCOUNTER — Other Ambulatory Visit: Payer: Self-pay

## 2020-10-27 ENCOUNTER — Ambulatory Visit (INDEPENDENT_AMBULATORY_CARE_PROVIDER_SITE_OTHER): Payer: 59

## 2020-10-27 ENCOUNTER — Encounter: Payer: Self-pay | Admitting: Podiatry

## 2020-10-27 DIAGNOSIS — M779 Enthesopathy, unspecified: Secondary | ICD-10-CM

## 2020-10-27 DIAGNOSIS — M722 Plantar fascial fibromatosis: Secondary | ICD-10-CM | POA: Diagnosis not present

## 2020-10-27 DIAGNOSIS — M778 Other enthesopathies, not elsewhere classified: Secondary | ICD-10-CM

## 2020-10-27 MED ORDER — METHYLPREDNISOLONE 4 MG PO TBPK: ORAL_TABLET | ORAL | 0 refills | Status: DC

## 2020-10-27 MED ORDER — MELOXICAM 15 MG PO TABS: 15.0000 mg | ORAL_TABLET | Freq: Every day | ORAL | 1 refills | Status: DC

## 2020-11-07 ENCOUNTER — Encounter: Payer: 59 | Admitting: Dermatology

## 2020-11-07 NOTE — Progress Notes (Signed)
   Subjective: 60 y.o. female presenting today as a new patient for evaluation of right foot pain has been going on approximately 1-2 months now.  She experiences sharp stabbing pains at random times.  She states that sometimes her pain wakes her up at night.  She has been taking Tylenol and Aleve and Advil to help alleviate her symptoms.  She presents for further treatment and evaluation   Past Medical History:  Diagnosis Date  . Cancer [facility]) 2002   Cervical CA with hysterectomy.  . Diabetes ([place]) 08/22/2015  . Dysplastic nevus 11/02/2019   Left spinal mid back. Moderate atypia, peripheral margin involved  . Dysplastic nevus 11/02/2019   Right spinal mid back. Severe atypia, peripheral margin involved. Excised 11/29/2019, margins free.  Marland Kitchen Dysplastic nevus 05/01/2010   Right medial lower calf. Mild to moderate atypia, extends to one edge.  Marland Kitchen Heart murmur   . Hx of dysplastic nevus 2007   multiple sites   . Hx of dysplastic nevus 2007   multiple sites   . Migraines   . PONV (postoperative nausea and vomiting)   . Sleep apnea    CPAP, recently stopped using due to 50lb weight loss  . Trigeminal neuralgia      Objective: Physical Exam General: The patient is alert and oriented x3 in no acute distress.  Dermatology: Skin is warm, dry and supple bilateral lower extremities. Negative for open lesions or macerations bilateral.   Vascular: Dorsalis Pedis and Posterior Tibial pulses palpable bilateral.  Capillary fill time is immediate to all digits.  Neurological: Epicritic and protective threshold intact bilateral.   Musculoskeletal: Tenderness to palpation to the plantar aspect of the right heel along the plantar fascia. All other joints range of motion within normal limits bilateral. Strength 5/5 in all groups bilateral.  There is also pain on palpation to the midtarsal joint of the right foot  Radiographic exam: Normal osseous mineralization. Joint spaces preserved. No  fracture/dislocation/boney destruction. No other soft tissue abnormalities or radiopaque foreign bodies.   Assessment: 1. Plantar fasciitis right 2.  Capsulitis right foot 3.  PMHx of sciatica RLE  Plan of Care:  1. Patient evaluated. Xrays reviewed.   2.  Patient declined injections today 3. Rx for Medrol Dose Pack placed 4. Rx for Meloxicam ordered for patient. 5.  Continue OTC Aetrex Insoles 6. Instructed patient regarding therapies and modalities at home to alleviate symptoms.  7. Return to clinic as needed   Edrick Kins, DPM [facility]  Dr. Edrick Kins, DPM    [address], [place] [address]                Office [phone]  Fax [phone]

## 2020-11-16 ENCOUNTER — Ambulatory Visit (INDEPENDENT_AMBULATORY_CARE_PROVIDER_SITE_OTHER): Payer: 59 | Admitting: Family Medicine

## 2020-11-16 ENCOUNTER — Other Ambulatory Visit: Payer: Self-pay

## 2020-11-16 ENCOUNTER — Encounter: Payer: Self-pay | Admitting: Family Medicine

## 2020-11-16 VITALS — BP 134/80 | HR 89 | Temp 98.6°F | Resp 16 | Wt 226.9 lb

## 2020-11-16 DIAGNOSIS — E039 Hypothyroidism, unspecified: Secondary | ICD-10-CM

## 2020-11-16 DIAGNOSIS — E785 Hyperlipidemia, unspecified: Secondary | ICD-10-CM

## 2020-11-16 DIAGNOSIS — Z1231 Encounter for screening mammogram for malignant neoplasm of breast: Secondary | ICD-10-CM

## 2020-11-16 DIAGNOSIS — E1142 Type 2 diabetes mellitus with diabetic polyneuropathy: Secondary | ICD-10-CM

## 2020-11-16 DIAGNOSIS — E1169 Type 2 diabetes mellitus with other specified complication: Secondary | ICD-10-CM | POA: Diagnosis not present

## 2020-11-16 LAB — POCT GLYCOSYLATED HEMOGLOBIN (HGB A1C)
Est. average glucose Bld gHb Est-mCnc: 154
Hemoglobin A1C: 7 % — AB (ref 4.0–5.6)

## 2020-11-16 NOTE — Assessment & Plan Note (Signed)
BMI 36 assoc with HLD and DM Discussed importance of healthy weight management Discussed diet and exercise

## 2020-11-16 NOTE — Progress Notes (Signed)
Established patient visit   Patient: Veronica Butler   DOB: [birth year]   60 y.o. Female  MRN: [id number] Visit Date: 11/16/2020  Today's healthcare provider: Lavon Paganini, MD   Chief Complaint  Patient presents with   Diabetes   Hyperlipidemia   Hypothyroidism   Subjective    HPI  Diabetes Mellitus Type II, Follow-up  Lab Results  Component Value Date   HGBA1C 7.0 (A) 11/16/2020   HGBA1C 6.6 (H) 05/24/2020   HGBA1C 6.2 (H) 03/18/2019   Wt Readings from Last 3 Encounters:  11/16/20 226 lb 14.4 oz (102.9 kg)  05/19/20 229 lb 9.6 oz (104.1 kg)  08/03/18 210 lb 3.2 oz (95.3 kg)   Last seen for diabetes 6 months ago.  Management since then includes continuing healthy diet. She reports excellent compliance with treatment. She is not having side effects.  Symptoms: No fatigue No foot ulcerations  No appetite changes No nausea  No paresthesia of the feet  No polydipsia  No polyuria No visual disturbances   No vomiting     Home blood sugar records: fasting range: 150'S  Episodes of hypoglycemia? No    Current insulin regiment: none Most Recent Eye Exam: more than 1 year ago Current exercise: none Current diet habits: in general, an "unhealthy" diet  Pertinent Labs: Lab Results  Component Value Date   CHOL 221 (H) 05/24/2020   HDL 45 05/24/2020   LDLCALC 136 (H) 05/24/2020   TRIG 225 (H) 05/24/2020   CHOLHDL 4.4 08/04/2018   Lab Results  Component Value Date   NA 141 05/24/2020   K 4.6 05/24/2020   CREATININE 0.72 05/24/2020   GFRNONAA 91 05/24/2020   GFRAA 105 05/24/2020   GLUCOSE 143 (H) 05/24/2020     ---------------------------------------------------------------------------------------------------  Hypothyroid, follow-up  Lab Results  Component Value Date   TSH 1.510 05/24/2020   TSH 1.820 03/18/2019   TSH 2.340 08/04/2018   Wt Readings from Last 3 Encounters:  11/16/20 226 lb 14.4 oz (102.9 kg)  05/19/20 229 lb 9.6 oz (104.1  kg)  08/03/18 210 lb 3.2 oz (95.3 kg)    She was last seen for hypothyroid 6 months ago.  Management since that visit includes continue same medication. She reports excellent compliance with treatment. She is not having side effects.   Symptoms: No change in energy level No constipation  No diarrhea No heat / cold intolerance  No nervousness No palpitations  No weight changes    ----------------------------------------------------------------------------------------- Lipid/Cholesterol, Follow-up  Last lipid panel Other pertinent labs  Lab Results  Component Value Date   CHOL 221 (H) 05/24/2020   HDL 45 05/24/2020   LDLCALC 136 (H) 05/24/2020   TRIG 225 (H) 05/24/2020   CHOLHDL 4.4 08/04/2018   Lab Results  Component Value Date   ALT 31 05/24/2020   AST 35 05/24/2020   PLT 251 08/04/2017   TSH 1.510 05/24/2020     She was last seen for this 6 months ago.  Management since that visit includes recommending a statin medication to lower risk of heart disease and stroke. Patient declined starting a statin and preferred to restart fish oil and work on lifestyle changes.    She reports excellent compliance with treatment. She is not having side effects.   Symptoms: No chest pain No chest pressure/discomfort  No dyspnea No lower extremity edema  No numbness or tingling of extremity No orthopnea  No palpitations No paroxysmal nocturnal dyspnea  No speech difficulty No  syncope   Current diet: in general, an "unhealthy" diet Current exercise: none  The 10-year ASCVD risk score Mikey Bussing DC Jr., et al., 2013) is: 9.2%  ---------------------------------------------------------------------------------------------------   Patient Active Problem List   Diagnosis Date Noted   Multinodular thyroid 05/19/2020   Bilateral leg cramps 03/17/2019   Morbid obesity ([place]) 08/03/2018   Special screening for malignant neoplasms, colon    Internal hemorrhoids    Diverticulosis of  large intestine without diverticulitis    Hypothyroidism 08/01/2017   Trigeminal neuralgia 08/01/2017   Diabetes ([place]) 08/22/2015   NASH (nonalcoholic steatohepatitis) 02/22/2015   CA cervix ([place]) 01/06/2015   Headache, migraine 01/06/2015   Neuropathy 01/06/2015   Apnea, sleep 01/06/2015   Thyroid nodule 01/06/2015   Chronic venous insufficiency 01/06/2015   Chronic urinary tract infection 02/25/2009   Allergic rhinitis 01/06/2009   Hyperlipidemia associated with type 2 diabetes mellitus ([place]) 02/19/2008   B12 deficiency 08/23/2007   Acid reflux 08/23/2007   Cannot sleep 08/23/2007   Adaptive colitis 08/23/2007   Cyst of ovary 08/23/2007   Social History   Tobacco Use   Smoking status: Never Smoker   Smokeless tobacco: Never Used  Substance Use Topics   Alcohol use: No   Drug use: No   Allergies  Allergen Reactions   Cortisone Other (See Comments)    High blood sugar   Tape Swelling and Dermatitis    Redness and itching.   Ciprofloxacin Rash   Floxacillin [Flucloxacillin] Rash   Levaquin [Levofloxacin In D5w] Rash   Quinolones Rash    Tendon pain as well as rash       Medications: Outpatient Medications Prior to Visit  Medication Sig   ALPHA LIPOIC ACID PO Take by mouth.   Blood Glucose Monitoring Suppl (ONE TOUCH ULTRA SYSTEM KIT) W/DEVICE KIT GLUCOMETER TEST STRIPS - Historical Medication  Check blood sugar daily ---- ULTRA ONE TOUCH  Started 11-Jul-2009 Active Comments: DX: 790.29   carbamazepine (TEGRETOL XR) 100 MG 12 hr tablet TAKE 1 TABLET BY MOUTH  DAILY   Cranberry 500 MG CAPS Take 1 capsule by mouth daily.   DIGESTIVE ENZYMES PO Take 1 capsule by mouth daily.   Magnesium 500 MG CAPS Take 2 tablets by mouth daily.    Misc Natural Products (ADRENAL) 200 MG CAPS Take 1 capsule by mouth 2 (two) times daily.    MULTIPLE VITAMIN PO Take 3 tablets by mouth daily.    Potassium 99 MG TABS Take by mouth.   Potassium  Gluconate 2.5 MEQ TABS Take by mouth.   psyllium (REGULOID) 0.52 g capsule Take 0.52 g by mouth 2 (two) times daily.   SYNTHROID 25 MCG tablet TAKE 1 TABLET BY MOUTH  DAILY BEFORE BREAKFAST AND  2 TABLETS BY MOUTH ON THE  WEEKENDS   [DISCONTINUED] meloxicam (MOBIC) 15 MG tablet Take 1 tablet (15 mg total) by mouth daily. (Patient not taking: Reported on 11/16/2020)   [DISCONTINUED] methylPREDNISolone (MEDROL DOSEPAK) 4 MG TBPK tablet 6 day dose pack - take as directed (Patient not taking: Reported on 11/16/2020)   No facility-administered medications prior to visit.    Review of Systems  Constitutional: Negative for chills, fatigue and unexpected weight change.  Eyes: Negative for visual disturbance.  Respiratory: Negative for cough, chest tightness, shortness of breath and wheezing.   Cardiovascular: Negative for chest pain and palpitations.  Endocrine: Negative for cold intolerance, heat intolerance, polydipsia, polyphagia and polyuria.    Last CBC Lab Results  Component Value Date  WBC 9.3 08/04/2017   HGB 14.2 08/04/2017   HCT 42.0 08/04/2017   MCV 89.4 08/04/2017   MCH 30.2 08/04/2017   RDW 11.9 08/04/2017   PLT 251 08/04/2017   Last vitamin B12 and Folate Lab Results  Component Value Date   VITAMINB12 1,269 (H) 05/24/2020       Objective    BP 134/80 Comment: home reading   Pulse 89    Temp 98.6 F (37 C) (Oral)    Resp 16    Wt 226 lb 14.4 oz (102.9 kg)    LMP 11/05/2000    SpO2 96%    BMI 36.62 kg/m  BP Readings from Last 3 Encounters:  11/16/20 134/80  05/19/20 132/88  03/17/19 118/80   Wt Readings from Last 3 Encounters:  11/16/20 226 lb 14.4 oz (102.9 kg)  05/19/20 229 lb 9.6 oz (104.1 kg)  08/03/18 210 lb 3.2 oz (95.3 kg)       Physical Exam Vitals reviewed.  Constitutional:      General: She is not in acute distress.    Appearance: Normal appearance. She is well-developed. She is not diaphoretic.  HENT:     Head: Normocephalic and atraumatic.   Eyes:     General: No scleral icterus.    Conjunctiva/sclera: Conjunctivae normal.  Neck:     Thyroid: No thyromegaly.  Cardiovascular:     Rate and Rhythm: Normal rate and regular rhythm.     Pulses: Normal pulses.     Heart sounds: Normal heart sounds. No murmur heard.   Pulmonary:     Effort: Pulmonary effort is normal. No respiratory distress.     Breath sounds: Normal breath sounds. No wheezing, rhonchi or rales.  Musculoskeletal:     Cervical back: Neck supple.     Right lower leg: No edema.     Left lower leg: No edema.  Lymphadenopathy:     Cervical: No cervical adenopathy.  Skin:    General: Skin is warm and dry.     Findings: No rash.  Neurological:     Mental Status: She is alert and oriented to person, place, and time. Mental status is at baseline.  Psychiatric:        Mood and Affect: Mood normal.        Behavior: Behavior normal.       Results for orders placed or performed in visit on 11/16/20  POCT glycosylated hemoglobin (Hb A1C)  Result Value Ref Range   Hemoglobin A1C 7.0 (A) 4.0 - 5.6 %   Est. average glucose Bld gHb Est-mCnc 154     Assessment & Plan     Problem List Items Addressed This Visit      Endocrine   Hyperlipidemia associated with type 2 diabetes mellitus ([place])    Reviewed last lipid panel Not currently on a statin Recheck FLP and CMP Discussed diet and exercise       Relevant Orders   Comprehensive metabolic panel   Lipid panel   Diabetes ([place]) - Primary    Uncontrolled with A1c of 7 On no meds Would rather 57mof lifestyle management rather than starting meds now reheck A1c in 317mnd reconsider ROI for eye exam UTD on foot exam and vaccines      Relevant Orders   POCT glycosylated hemoglobin (Hb A1C) (Completed)   Hypothyroidism    Previously well controlled Continue Synthroid at current dose  Recheck TSH and adjust Synthroid as indicated  Relevant Orders   TSH     Other   Morbid obesity ([place])    BMI  36 assoc with HLD and DM Discussed importance of healthy weight management Discussed diet and exercise        Other Visit Diagnoses    Breast cancer screening by mammogram       Relevant Orders   MM 3D SCREEN BREAST BILATERAL       Return in about 3 months (around 02/16/2021) for chronic disease f/u.      I, Lavon Paganini, MD, have reviewed all documentation for this visit. The documentation on 11/16/20 for the exam, diagnosis, procedures, and orders are all accurate and complete.   Aisha Greenberger, Dionne Bucy, MD, MPH [facility]

## 2020-11-16 NOTE — Patient Instructions (Signed)
Diabetes Mellitus and Nutrition, Adult When you have diabetes, or diabetes mellitus, it is very important to have healthy eating habits because your blood sugar (glucose) levels are greatly affected by what you eat and drink. Eating healthy foods in the right amounts, at about the same times every day, can help you:  Control your blood glucose.  Lower your risk of heart disease.  Improve your blood pressure.  Reach or maintain a healthy weight. What can affect my meal plan? Every person with diabetes is different, and each person has different needs for a meal plan. Your health care provider may recommend that you work with a dietitian to make a meal plan that is best for you. Your meal plan may vary depending on factors such as:  The calories you need.  The medicines you take.  Your weight.  Your blood glucose, blood pressure, and cholesterol levels.  Your activity level.  Other health conditions you have, such as heart or kidney disease. How do carbohydrates affect me? Carbohydrates, also called carbs, affect your blood glucose level more than any other type of food. Eating carbs naturally raises the amount of glucose in your blood. Carb counting is a method for keeping track of how many carbs you eat. Counting carbs is important to keep your blood glucose at a healthy level, especially if you use insulin or take certain oral diabetes medicines. It is important to know how many carbs you can safely have in each meal. This is different for every person. Your dietitian can help you calculate how many carbs you should have at each meal and for each snack. How does alcohol affect me? Alcohol can cause a sudden decrease in blood glucose (hypoglycemia), especially if you use insulin or take certain oral diabetes medicines. Hypoglycemia can be a life-threatening condition. Symptoms of hypoglycemia, such as sleepiness, dizziness, and confusion, are similar to symptoms of having too much  alcohol.  Do not drink alcohol if: ? Your health care provider tells you not to drink. ? You are pregnant, may be pregnant, or are planning to become pregnant.  If you drink alcohol: ? Do not drink on an empty stomach. ? Limit how much you use to:  0-1 drink a day for women.  0-2 drinks a day for men. ? Be aware of how much alcohol is in your drink. In the [place] one drink equals one 12 oz bottle of beer (355 mL), one 5 oz glass of wine (148 mL), or one 1 oz glass of hard liquor (44 mL). ? Keep yourself hydrated with water, diet soda, or unsweetened iced tea.  Keep in mind that regular soda, juice, and other mixers may contain a lot of sugar and must be counted as carbs. What are tips for following this plan? Reading food labels  Start by checking the serving size on the "Nutrition Facts" label of packaged foods and drinks. The amount of calories, carbs, fats, and other nutrients listed on the label is based on one serving of the item. Many items contain more than one serving per package.  Check the total grams (g) of carbs in one serving. You can calculate the number of servings of carbs in one serving by dividing the total carbs by 15. For example, if a food has 30 g of total carbs per serving, it would be equal to 2 servings of carbs.  Check the number of grams (g) of saturated fats and trans fats in one serving. Choose foods that have   a low amount or none of these fats.  Check the number of milligrams (mg) of salt (sodium) in one serving. Most people should limit total sodium intake to less than 2,300 mg per day.  Always check the nutrition information of foods labeled as "low-fat" or "nonfat." These foods may be higher in added sugar or refined carbs and should be avoided.  Talk to your dietitian to identify your daily goals for nutrients listed on the label. Shopping  Avoid buying canned, pre-made, or processed foods. These foods tend to be high in fat, sodium, and added  sugar.  Shop around the outside edge of the grocery store. This is where you will most often find fresh fruits and vegetables, bulk grains, fresh meats, and fresh dairy. Cooking  Use low-heat cooking methods, such as baking, instead of high-heat cooking methods like deep frying.  Cook using healthy oils, such as olive, canola, or sunflower oil.  Avoid cooking with butter, cream, or high-fat meats. Meal planning  Eat meals and snacks regularly, preferably at the same times every day. Avoid going long periods of time without eating.  Eat foods that are high in fiber, such as fresh fruits, vegetables, beans, and whole grains. Talk with your dietitian about how many servings of carbs you can eat at each meal.  Eat 4-6 oz (112-168 g) of lean protein each day, such as lean meat, chicken, fish, eggs, or tofu. One ounce (oz) of lean protein is equal to: ? 1 oz (28 g) of meat, chicken, or fish. ? 1 egg. ?  cup (62 g) of tofu.  Eat some foods each day that contain healthy fats, such as avocado, nuts, seeds, and fish.   What foods should I eat? Fruits Berries. Apples. Oranges. Peaches. Apricots. Plums. Grapes. Mango. Papaya. Pomegranate. Kiwi. Cherries. Vegetables Lettuce. Spinach. Leafy greens, including kale, chard, collard greens, and mustard greens. Beets. Cauliflower. Cabbage. Broccoli. Carrots. Green beans. Tomatoes. Peppers. Onions. Cucumbers. Brussels sprouts. Grains Whole grains, such as whole-wheat or whole-grain bread, crackers, tortillas, cereal, and pasta. Unsweetened oatmeal. Quinoa. Brown or wild rice. Meats and other proteins Seafood. Poultry without skin. Lean cuts of poultry and beef. Tofu. Nuts. Seeds. Dairy Low-fat or fat-free dairy products such as milk, yogurt, and cheese. The items listed above may not be a complete list of foods and beverages you can eat. Contact a dietitian for more information. What foods should I avoid? Fruits Fruits canned with  syrup. Vegetables Canned vegetables. Frozen vegetables with butter or cream sauce. Grains Refined white flour and flour products such as bread, pasta, snack foods, and cereals. Avoid all processed foods. Meats and other proteins Fatty cuts of meat. Poultry with skin. Breaded or fried meats. Processed meat. Avoid saturated fats. Dairy Full-fat yogurt, cheese, or milk. Beverages Sweetened drinks, such as soda or iced tea. The items listed above may not be a complete list of foods and beverages you should avoid. Contact a dietitian for more information. Questions to ask a health care provider  Do I need to meet with a diabetes educator?  Do I need to meet with a dietitian?  What number can I call if I have questions?  When are the best times to check my blood glucose? Where to find more information:  American Diabetes Association: diabetes.org  Academy of Nutrition and Dietetics: www.eatright.org  National Institute of Diabetes and Digestive and Kidney Diseases: www.niddk.nih.gov  Association of Diabetes Care and Education Specialists: www.diabeteseducator.org Summary  It is important to have healthy eating   habits because your blood sugar (glucose) levels are greatly affected by what you eat and drink.  A healthy meal plan will help you control your blood glucose and maintain a healthy lifestyle.  Your health care provider may recommend that you work with a dietitian to make a meal plan that is best for you.  Keep in mind that carbohydrates (carbs) and alcohol have immediate effects on your blood glucose levels. It is important to count carbs and to use alcohol carefully. This information is not intended to replace advice given to you by your health care provider. Make sure you discuss any questions you have with your health care provider. Document Revised: 08/10/2019 Document Reviewed: 08/10/2019 Elsevier Patient Education  2021 Elsevier Inc.  

## 2020-11-16 NOTE — Assessment & Plan Note (Signed)
Uncontrolled with A1c of 7 On no meds Would rather 70mof lifestyle management rather than starting meds now reheck A1c in 371mnd reconsider ROI for eye exam UTD on foot exam and vaccines

## 2020-11-16 NOTE — Assessment & Plan Note (Signed)
Reviewed last lipid panel Not currently on a statin Recheck FLP and CMP Discussed diet and exercise  

## 2020-11-16 NOTE — Assessment & Plan Note (Signed)
Previously well controlled Continue Synthroid at current dose  Recheck TSH and adjust Synthroid as indicated   

## 2020-11-17 ENCOUNTER — Encounter: Payer: Self-pay | Admitting: Family Medicine

## 2020-11-17 LAB — LIPID PANEL
Chol/HDL Ratio: 4.8 ratio — ABNORMAL HIGH (ref 0.0–4.4)
Cholesterol, Total: 237 mg/dL — ABNORMAL HIGH (ref 100–199)
HDL: 49 mg/dL (ref >39)
LDL Chol Calc (NIH): 142 mg/dL — ABNORMAL HIGH (ref 0–99)
Triglycerides: 253 mg/dL — ABNORMAL HIGH (ref 0–149)
VLDL Cholesterol Cal: 46 mg/dL — ABNORMAL HIGH (ref 5–40)

## 2020-11-17 LAB — COMPREHENSIVE METABOLIC PANEL
ALT: 24 IU/L (ref 0–32)
AST: 25 IU/L (ref 0–40)
Albumin/Globulin Ratio: 2 (ref 1.2–2.2)
Albumin: 4.9 g/dL — ABNORMAL HIGH (ref 3.8–4.8)
Alkaline Phosphatase: 90 IU/L (ref 44–121)
BUN/Creatinine Ratio: 22 (ref 12–28)
BUN: 16 mg/dL (ref 8–27)
Bilirubin Total: 0.3 mg/dL (ref 0.0–1.2)
CO2: 22 mmol/L (ref 20–29)
Calcium: 9.8 mg/dL (ref 8.7–10.3)
Chloride: 102 mmol/L (ref 96–106)
Creatinine, Ser: 0.72 mg/dL (ref 0.57–1.00)
Globulin, Total: 2.4 g/dL (ref 1.5–4.5)
Glucose: 121 mg/dL — ABNORMAL HIGH (ref 65–99)
Potassium: 4.8 mmol/L (ref 3.5–5.2)
Sodium: 140 mmol/L (ref 134–144)
Total Protein: 7.3 g/dL (ref 6.0–8.5)
eGFR: 95 mL/min/{1.73_m2} (ref >59)

## 2020-11-17 LAB — TSH: TSH: 1.3 u[IU]/mL (ref 0.450–4.500)

## 2020-11-20 ENCOUNTER — Encounter: Payer: Self-pay | Admitting: Family Medicine

## 2020-11-21 ENCOUNTER — Ambulatory Visit: Payer: 59 | Admitting: Podiatry

## 2020-12-01 LAB — HM MAMMOGRAPHY

## 2021-01-29 ENCOUNTER — Telehealth: Payer: Self-pay

## 2021-01-29 ENCOUNTER — Ambulatory Visit: Payer: 59 | Admitting: Dermatology

## 2021-01-29 DIAGNOSIS — E1142 Type 2 diabetes mellitus with diabetic polyneuropathy: Secondary | ICD-10-CM

## 2021-01-29 DIAGNOSIS — E785 Hyperlipidemia, unspecified: Secondary | ICD-10-CM

## 2021-01-29 DIAGNOSIS — E1169 Type 2 diabetes mellitus with other specified complication: Secondary | ICD-10-CM

## 2021-01-29 NOTE — Telephone Encounter (Signed)
Okay to place order for screening labs or would you like for patient to wait till appointment date? KW

## 2021-01-29 NOTE — Telephone Encounter (Signed)
Copied from [place] [phone]. Topic: General - Other >> Jan 29, 2021 10:42 AM Veronica Butler wrote: Reason for CRM: Pt as a 96mh f/uappt on 7.21.22/ she would like lab orders to go to lab a few days before her appt so her labs can be discussed at her appt / please advise

## 2021-01-29 NOTE — Telephone Encounter (Signed)
Ok to order CMP, lipids, A1c. Needs to get at least 2 days before appt.

## 2021-01-29 NOTE — Addendum Note (Signed)
Addended by: Minette Headland on: 01/29/2021 02:49 PM   Modules accepted: Orders

## 2021-01-29 NOTE — Telephone Encounter (Signed)
Patient has been advised. KW 

## 2021-02-15 ENCOUNTER — Ambulatory Visit: Payer: Self-pay | Admitting: Family Medicine

## 2021-03-14 ENCOUNTER — Ambulatory Visit (INDEPENDENT_AMBULATORY_CARE_PROVIDER_SITE_OTHER): Payer: 59 | Admitting: Dermatology

## 2021-03-14 ENCOUNTER — Other Ambulatory Visit: Payer: Self-pay

## 2021-03-14 DIAGNOSIS — L820 Inflamed seborrheic keratosis: Secondary | ICD-10-CM | POA: Diagnosis not present

## 2021-03-14 DIAGNOSIS — L649 Androgenic alopecia, unspecified: Secondary | ICD-10-CM

## 2021-03-14 DIAGNOSIS — Z1283 Encounter for screening for malignant neoplasm of skin: Secondary | ICD-10-CM | POA: Diagnosis not present

## 2021-03-14 DIAGNOSIS — L578 Other skin changes due to chronic exposure to nonionizing radiation: Secondary | ICD-10-CM

## 2021-03-14 DIAGNOSIS — D1800 Hemangioma unspecified site: Secondary | ICD-10-CM

## 2021-03-14 DIAGNOSIS — L814 Other melanin hyperpigmentation: Secondary | ICD-10-CM

## 2021-03-14 DIAGNOSIS — L821 Other seborrheic keratosis: Secondary | ICD-10-CM

## 2021-03-14 DIAGNOSIS — Z86018 Personal history of other benign neoplasm: Secondary | ICD-10-CM | POA: Diagnosis not present

## 2021-03-14 DIAGNOSIS — D2239 Melanocytic nevi of other parts of face: Secondary | ICD-10-CM

## 2021-03-14 DIAGNOSIS — D225 Melanocytic nevi of trunk: Secondary | ICD-10-CM

## 2021-03-14 DIAGNOSIS — D229 Melanocytic nevi, unspecified: Secondary | ICD-10-CM

## 2021-03-14 DIAGNOSIS — D2272 Melanocytic nevi of left lower limb, including hip: Secondary | ICD-10-CM

## 2021-03-14 NOTE — Patient Instructions (Addendum)
Melanoma ABCDEs  Melanoma is the most dangerous type of skin cancer, and is the leading cause of death from skin disease.  You are more likely to develop melanoma if you: Have light-colored skin, light-colored eyes, or red or blond hair Spend a lot of time in the sun Tan regularly, either outdoors or in a tanning bed Have had blistering sunburns, especially during childhood Have a close family member who has had a melanoma Have atypical moles or large birthmarks  Early detection of melanoma is key since treatment is typically straightforward and cure rates are extremely high if we catch it early.   The first sign of melanoma is often a change in a mole or a new dark spot.  The ABCDE system is a way of remembering the signs of melanoma.  A for asymmetry:  The two halves do not match. B for border:  The edges of the growth are irregular. C for color:  A mixture of colors are present instead of an even brown color. D for diameter:  Melanomas are usually (but not always) greater than 20m - the size of a pencil eraser. E for evolution:  The spot keeps changing in size, shape, and color.  Please check your skin once per month between visits. You can use a small mirror in front and a large mirror behind you to keep an eye on the back side or your body.   If you see any new or changing lesions before your next follow-up, please call to schedule a visit.  Please continue daily skin protection including broad spectrum sunscreen SPF 30+ to sun-exposed areas, reapplying every 2 hours as needed when you're outdoors.   Staying in the shade or wearing long sleeves, sun glasses (UVA+UVB protection) and wide brim hats (4-inch brim around the entire circumference of the hat) are also recommended for sun protection.    Cryotherapy  Cryotherapy is the treatment of lesions with the application of a cold substance.  In most cases, liquid nitrogen is used to destroy the lesion(s).  Liquid nitrogen is so  cold, -196 Celsius, it feels like it is burning when it is applied.  After treatment with liquid nitrogen, there may be some burning sensation or pain that can last up to 24 hours.  The area may also be swollen and red.  The discomfort can be relieved with ibuprofen (Advil, Motrin), acetaminophen (Extra Strength Tylenol), or similar pain relief medication.  Within 24 to 48 hours, a blister may form.  Occasionally, these blisters will become filled with blood and become very dark.  This is no cause for concern.  The blister will gradually dry up over a period of several days, eventually separating from the healing skin below in about one to two weeks.  The surrounding skin will become red and the area may become itchy.  This is all part of the normal healing process.  Occasionally, the crusts will last as long as four weeks when certain deeper spots on the skin are treated.  Sometimes a permanent white mark or scar will be left after healing.  You may continue all of your normal activities as long as they do not cause pain in the treated areas.  It is okay to get the area wet.  After therapy or if the blister is still intact - You may treat it like normal skin.  Covering the area with a bandage may offer some comfort and protection from trauma but is not absolutely necessary.  If  the blister is uncomfortable - Clean a small needle with rubbing alcohol, then gently make a small hole in the side of the blister to drain the blister fluid.  This often gives immediate relief.  Do not remove the blister roof, as the blister aids in healing.  In time it will fall off on its own.   Once the blister roof falls off or a sore forms -  Clean the blister sites with soap and water.  Rinse the area and pat dry.  Do not force off the blister roof or crust. Apply an antibiotic ointment such as Polysporin or Bacitracin. A bandage may be applied loosely over the blister until it is healed if desired. Call our office if  you are concerned it may be infected.  Some redness, itching and oozing is part of the normal healing process.  Signs of infection include increasing redness, increasing pain, swelling, heat, or yellow discharge. If you have any questions or concerns for your doctor, please call our main line at [phone] and press option 4 to reach your doctor's medical assistant. If no one answers, please leave a voicemail as directed and we will return your call as soon as possible. Messages left after 4 pm will be answered the following business day.   You may also send [place] a message via [facility]. We typically respond to MyChart messages within 1-2 business days.  For prescription refills, please ask your pharmacy to contact our office. Our fax number is [phone].  If you have an urgent issue when the clinic is closed that cannot wait until the next business day, you can page your doctor at the number below.    Please note that while we do our best to be available for urgent issues outside of office hours, we are not available 24/7.   If you have an urgent issue and are unable to reach [place], you may choose to seek medical care at your doctor's office, retail clinic, urgent care center, or emergency room.  If you have a medical emergency, please immediately call 911 or go to the emergency department.  Pager Numbers  - Dr. Nehemiah Massed: [phone]  - Dr. Laurence Ferrari: [phone]  - Dr. Nicole Kindred: [phone]  In the event of inclement weather, please call our main line at [phone] for an update on the status of any delays or closures.  Dermatology Medication Tips: Please keep the boxes that topical medications come in in order to help keep track of the instructions about where and how to use these. Pharmacies typically print the medication instructions only on the boxes and not directly on the medication tubes.   If your medication is too expensive, please contact our office at [phone] option 4 or  send [place] a message through [facility].   We are unable to tell what your co-pay for medications will be in advance as this is different depending on your insurance coverage. However, we may be able to find a substitute medication at lower cost or fill out paperwork to get insurance to cover a needed medication.   If a prior authorization is required to get your medication covered by your insurance company, please allow [place] 1-2 business days to complete this process.  Drug prices often vary depending on where the prescription is filled and some pharmacies may offer cheaper prices.  The website www.goodrx.com contains coupons for medications through different pharmacies. The prices here do not account for what the cost may be with help from insurance (it may  be cheaper with your insurance), but the website can give you the price if you did not use any insurance.  - You can print the associated coupon and take it with your prescription to the pharmacy.  - You may also stop by our office during regular business hours and pick up a GoodRx coupon card.  - If you need your prescription sent electronically to a different pharmacy, notify our office through [facility] or by phone at [phone] option 4.

## 2021-03-14 NOTE — Progress Notes (Signed)
Follow-Up Visit   Subjective  Veronica Butler is a 60 y.o. female who presents for the following: Annual Exam (Skin cancer screening. Full body. HxDN, multiple sites. DN with severe atypia at right back, 2021, excised. Few areas of concern on face. Flaky, itchy at times. ).  She has also noticed increased hair thinning on top of scalp over time.    The following portions of the chart were reviewed this encounter and updated as appropriate:       Review of Systems: No other skin or systemic complaints except as noted in HPI or Assessment and Plan.   Objective  Well appearing patient in no apparent distress; mood and affect are within normal limits.  A full examination was performed including scalp, head, eyes, ears, nose, lips, neck, chest, axillae, abdomen, back, buttocks, bilateral upper extremities, bilateral lower extremities, hands, feet, fingers, toes, fingernails, and toenails. All findings within normal limits unless otherwise noted below.  Right Cheek x1 Erythematous keratotic or waxy stuck-on papule   Right Ear Concha 0.3 cm speckled brown macule- pt states present for years no changes     Crown Scalp Diffuse thinning of the crown and widening of the midline part - Reviewed progressive nature and prognosis.   Left Dorsal Hand, Left Foot  dorsum, Left Spinal Upper Back, Left lateral Breast, Left lateral canthus 3 mm flesh papule. Left lateral canthus  4 mm Medium brown macule slight irregular border. Left lateral breast.  3 mm brown macule. Left foot dorsum  5 mm gray brown macule, slightly waxy. Left dorsal hand. Blue nevus vs SK  5x4 mm brown speckled macule. Left spinal upper back         Assessment & Plan  Inflamed seborrheic keratosis Right Cheek x1  Destruction of lesion - Right Cheek x1  Destruction method: cryotherapy   Informed consent: discussed and consent obtained   Lesion destroyed using liquid nitrogen: Yes   Region frozen until ice  ball extended beyond lesion: Yes   Outcome: patient tolerated procedure well with no complications   Post-procedure details: wound care instructions given   Additional details:  Prior to procedure, discussed risks of blister formation, small wound, skin dyspigmentation, or rare scar following cryotherapy. Recommend Vaseline ointment to treated areas while healing.   Seborrheic keratosis Right Ear Concha  SK vs Lentigo.   Benign-appearing.  Observation.  Call clinic for new or changing moles.  Recommend daily use of broad spectrum spf 30+ sunscreen to sun-exposed areas.    Androgenic alopecia Crown Scalp  Chronic condition related to genetics and hormonal changes associated with menopause  Recommend using Men's Rogaine or Women's Rogaine 5%.qhs  Recommend minoxidil 5% (Rogaine for men) solution or foam to be applied to the scalp and left in. This should ideally be used twice daily for best results but it helps with hair regrowth when used at least three times per week. Rogaine initially can cause increased hair shedding for the first few weeks but this will stop with continued use. In studies, people who used minoxidil (Rogaine) for at least 6 months had thicker hair than people who did not. Minoxidil topical (Rogaine) only works as long as it continues to be used. If if it is no longer used then the hair it has been helping to regrow can fall out. Minoxidil topical (Rogaine) can cause increased facial hair growth which can usually be managed easily with a battery-operated hair trimmer. If facial hair growth is bothersome, switching to the 2%  women's version can decrease the risk of unwanted facial hair growth.   Nevus (5) Left Foot  dorsum; Left lateral Breast; Left Spinal Upper Back; Left Dorsal Hand; Left lateral canthus  Benign-appearing.  Observation.  Call clinic for new or changing lesions.  Recommend daily use of broad spectrum spf 30+ sunscreen to sun-exposed areas.    Lentigines -  Scattered tan macules - Due to sun exposure - Benign-appering, observe - Recommend daily broad spectrum sunscreen SPF 30+ to sun-exposed areas, reapply every 2 hours as needed. - Call for any changes  Seborrheic Keratoses - Stuck-on, waxy, tan-brown papules and/or plaques  - Benign-appearing - Discussed benign etiology and prognosis. - Observe - Call for any changes  Melanocytic Nevi - Tan-brown and/or pink-flesh-colored symmetric macules and papules - Benign appearing on exam today - Observation - Call clinic for new or changing moles - Recommend daily use of broad spectrum spf 30+ sunscreen to sun-exposed areas.   Hemangiomas - Red papules - Discussed benign nature - Observe - Call for any changes  Actinic Damage - Chronic condition, secondary to cumulative UV/sun exposure - diffuse scaly erythematous macules with underlying dyspigmentation - Recommend daily broad spectrum sunscreen SPF 30+ to sun-exposed areas, reapply every 2 hours as needed.  - Staying in the shade or wearing long sleeves, sun glasses (UVA+UVB protection) and wide brim hats (4-inch brim around the entire circumference of the hat) are also recommended for sun protection.  - Call for new or changing lesions.  Skin cancer screening performed today.  History of Dysplastic Nevi at back, multiple sites - No evidence of recurrence today - Recommend regular full body skin exams - Recommend daily broad spectrum sunscreen SPF 30+ to sun-exposed areas, reapply every 2 hours as needed.  - Call if any new or changing lesions are noted between office visits   Return in about 1 year (around 03/14/2022) for TBSE.   I, Emelia Salisbury, CMA, am acting as scribe for Brendolyn Patty, MD.  Documentation: I have reviewed the above documentation for accuracy and completeness, and I agree with the above.  Brendolyn Patty MD

## 2021-04-04 LAB — COMPREHENSIVE METABOLIC PANEL
ALT: 20 IU/L (ref 0–32)
AST: 21 IU/L (ref 0–40)
Albumin/Globulin Ratio: 2.4 — ABNORMAL HIGH (ref 1.2–2.2)
Albumin: 4.8 g/dL (ref 3.8–4.8)
Alkaline Phosphatase: 89 IU/L (ref 44–121)
BUN/Creatinine Ratio: 21 (ref 12–28)
BUN: 16 mg/dL (ref 8–27)
Bilirubin Total: 0.3 mg/dL (ref 0.0–1.2)
CO2: 22 mmol/L (ref 20–29)
Calcium: 9.2 mg/dL (ref 8.7–10.3)
Chloride: 104 mmol/L (ref 96–106)
Creatinine, Ser: 0.77 mg/dL (ref 0.57–1.00)
Globulin, Total: 2 g/dL (ref 1.5–4.5)
Glucose: 163 mg/dL — ABNORMAL HIGH (ref 65–99)
Potassium: 4.6 mmol/L (ref 3.5–5.2)
Sodium: 142 mmol/L (ref 134–144)
Total Protein: 6.8 g/dL (ref 6.0–8.5)
eGFR: 87 mL/min/{1.73_m2} (ref >59)

## 2021-04-04 LAB — HEMOGLOBIN A1C
Est. average glucose Bld gHb Est-mCnc: 166 mg/dL
Hgb A1c MFr Bld: 7.4 % — ABNORMAL HIGH (ref 4.8–5.6)

## 2021-04-04 LAB — LIPID PANEL
Chol/HDL Ratio: 4.9 ratio — ABNORMAL HIGH (ref 0.0–4.4)
Cholesterol, Total: 222 mg/dL — ABNORMAL HIGH (ref 100–199)
HDL: 45 mg/dL (ref >39)
LDL Chol Calc (NIH): 135 mg/dL — ABNORMAL HIGH (ref 0–99)
Triglycerides: 234 mg/dL — ABNORMAL HIGH (ref 0–149)
VLDL Cholesterol Cal: 42 mg/dL — ABNORMAL HIGH (ref 5–40)

## 2021-04-05 ENCOUNTER — Other Ambulatory Visit: Payer: Self-pay

## 2021-04-05 ENCOUNTER — Ambulatory Visit (INDEPENDENT_AMBULATORY_CARE_PROVIDER_SITE_OTHER): Payer: 59 | Admitting: Family Medicine

## 2021-04-05 ENCOUNTER — Encounter: Payer: Self-pay | Admitting: Family Medicine

## 2021-04-05 VITALS — BP 149/98 | HR 102 | Temp 98.3°F | Resp 16 | Ht 66.0 in | Wt 229.1 lb

## 2021-04-05 DIAGNOSIS — E1169 Type 2 diabetes mellitus with other specified complication: Secondary | ICD-10-CM | POA: Diagnosis not present

## 2021-04-05 DIAGNOSIS — E785 Hyperlipidemia, unspecified: Secondary | ICD-10-CM

## 2021-04-05 DIAGNOSIS — E039 Hypothyroidism, unspecified: Secondary | ICD-10-CM | POA: Diagnosis not present

## 2021-04-05 DIAGNOSIS — I152 Hypertension secondary to endocrine disorders: Secondary | ICD-10-CM

## 2021-04-05 DIAGNOSIS — E1142 Type 2 diabetes mellitus with diabetic polyneuropathy: Secondary | ICD-10-CM | POA: Diagnosis not present

## 2021-04-05 DIAGNOSIS — E1159 Type 2 diabetes mellitus with other circulatory complications: Secondary | ICD-10-CM

## 2021-04-05 HISTORY — DX: Type 2 diabetes mellitus with other circulatory complications: E11.59

## 2021-04-05 HISTORY — DX: Hypertension secondary to endocrine disorders: I15.2

## 2021-04-05 MED ORDER — OZEMPIC (0.25 OR 0.5 MG/DOSE) 2 MG/1.5ML ~~LOC~~ SOPN: 0.5000 mg | PEN_INJECTOR | SUBCUTANEOUS | 0 refills | Status: DC

## 2021-04-05 MED ORDER — OZEMPIC (0.25 OR 0.5 MG/DOSE) 2 MG/1.5ML ~~LOC~~ SOPN: 0.5000 mg | PEN_INJECTOR | SUBCUTANEOUS | 3 refills | Status: DC

## 2021-04-05 NOTE — Patient Instructions (Signed)
Diabetes Mellitus and Nutrition, Adult When you have diabetes, or diabetes mellitus, it is very important to have healthy eating habits because your blood sugar (glucose) levels are greatly affected by what you eat and drink. Eating healthy foods in the right amounts, at about the same times every day, can help you:  Control your blood glucose.  Lower your risk of heart disease.  Improve your blood pressure.  Reach or maintain a healthy weight. What can affect my meal plan? Every person with diabetes is different, and each person has different needs for a meal plan. Your health care provider may recommend that you work with a dietitian to make a meal plan that is best for you. Your meal plan may vary depending on factors such as:  The calories you need.  The medicines you take.  Your weight.  Your blood glucose, blood pressure, and cholesterol levels.  Your activity level.  Other health conditions you have, such as heart or kidney disease. How do carbohydrates affect me? Carbohydrates, also called carbs, affect your blood glucose level more than any other type of food. Eating carbs naturally raises the amount of glucose in your blood. Carb counting is a method for keeping track of how many carbs you eat. Counting carbs is important to keep your blood glucose at a healthy level, especially if you use insulin or take certain oral diabetes medicines. It is important to know how many carbs you can safely have in each meal. This is different for every person. Your dietitian can help you calculate how many carbs you should have at each meal and for each snack. How does alcohol affect me? Alcohol can cause a sudden decrease in blood glucose (hypoglycemia), especially if you use insulin or take certain oral diabetes medicines. Hypoglycemia can be a life-threatening condition. Symptoms of hypoglycemia, such as sleepiness, dizziness, and confusion, are similar to symptoms of having too much  alcohol.  Do not drink alcohol if: ? Your health care provider tells you not to drink. ? You are pregnant, may be pregnant, or are planning to become pregnant.  If you drink alcohol: ? Do not drink on an empty stomach. ? Limit how much you use to:  0-1 drink a day for women.  0-2 drinks a day for men. ? Be aware of how much alcohol is in your drink. In the [place] one drink equals one 12 oz bottle of beer (355 mL), one 5 oz glass of wine (148 mL), or one 1 oz glass of hard liquor (44 mL). ? Keep yourself hydrated with water, diet soda, or unsweetened iced tea.  Keep in mind that regular soda, juice, and other mixers may contain a lot of sugar and must be counted as carbs. What are tips for following this plan? Reading food labels  Start by checking the serving size on the "Nutrition Facts" label of packaged foods and drinks. The amount of calories, carbs, fats, and other nutrients listed on the label is based on one serving of the item. Many items contain more than one serving per package.  Check the total grams (g) of carbs in one serving. You can calculate the number of servings of carbs in one serving by dividing the total carbs by 15. For example, if a food has 30 g of total carbs per serving, it would be equal to 2 servings of carbs.  Check the number of grams (g) of saturated fats and trans fats in one serving. Choose foods that have   a low amount or none of these fats.  Check the number of milligrams (mg) of salt (sodium) in one serving. Most people should limit total sodium intake to less than 2,300 mg per day.  Always check the nutrition information of foods labeled as "low-fat" or "nonfat." These foods may be higher in added sugar or refined carbs and should be avoided.  Talk to your dietitian to identify your daily goals for nutrients listed on the label. Shopping  Avoid buying canned, pre-made, or processed foods. These foods tend to be high in fat, sodium, and added  sugar.  Shop around the outside edge of the grocery store. This is where you will most often find fresh fruits and vegetables, bulk grains, fresh meats, and fresh dairy. Cooking  Use low-heat cooking methods, such as baking, instead of high-heat cooking methods like deep frying.  Cook using healthy oils, such as olive, canola, or sunflower oil.  Avoid cooking with butter, cream, or high-fat meats. Meal planning  Eat meals and snacks regularly, preferably at the same times every day. Avoid going long periods of time without eating.  Eat foods that are high in fiber, such as fresh fruits, vegetables, beans, and whole grains. Talk with your dietitian about how many servings of carbs you can eat at each meal.  Eat 4-6 oz (112-168 g) of lean protein each day, such as lean meat, chicken, fish, eggs, or tofu. One ounce (oz) of lean protein is equal to: ? 1 oz (28 g) of meat, chicken, or fish. ? 1 egg. ?  cup (62 g) of tofu.  Eat some foods each day that contain healthy fats, such as avocado, nuts, seeds, and fish.   What foods should I eat? Fruits Berries. Apples. Oranges. Peaches. Apricots. Plums. Grapes. Mango. Papaya. Pomegranate. Kiwi. Cherries. Vegetables Lettuce. Spinach. Leafy greens, including kale, chard, collard greens, and mustard greens. Beets. Cauliflower. Cabbage. Broccoli. Carrots. Green beans. Tomatoes. Peppers. Onions. Cucumbers. Brussels sprouts. Grains Whole grains, such as whole-wheat or whole-grain bread, crackers, tortillas, cereal, and pasta. Unsweetened oatmeal. Quinoa. Brown or wild rice. Meats and other proteins Seafood. Poultry without skin. Lean cuts of poultry and beef. Tofu. Nuts. Seeds. Dairy Low-fat or fat-free dairy products such as milk, yogurt, and cheese. The items listed above may not be a complete list of foods and beverages you can eat. Contact a dietitian for more information. What foods should I avoid? Fruits Fruits canned with  syrup. Vegetables Canned vegetables. Frozen vegetables with butter or cream sauce. Grains Refined white flour and flour products such as bread, pasta, snack foods, and cereals. Avoid all processed foods. Meats and other proteins Fatty cuts of meat. Poultry with skin. Breaded or fried meats. Processed meat. Avoid saturated fats. Dairy Full-fat yogurt, cheese, or milk. Beverages Sweetened drinks, such as soda or iced tea. The items listed above may not be a complete list of foods and beverages you should avoid. Contact a dietitian for more information. Questions to ask a health care provider  Do I need to meet with a diabetes educator?  Do I need to meet with a dietitian?  What number can I call if I have questions?  When are the best times to check my blood glucose? Where to find more information:  American Diabetes Association: diabetes.org  Academy of Nutrition and Dietetics: www.eatright.org  National Institute of Diabetes and Digestive and Kidney Diseases: www.niddk.nih.gov  Association of Diabetes Care and Education Specialists: www.diabeteseducator.org Summary  It is important to have healthy eating   habits because your blood sugar (glucose) levels are greatly affected by what you eat and drink.  A healthy meal plan will help you control your blood glucose and maintain a healthy lifestyle.  Your health care provider may recommend that you work with a dietitian to make a meal plan that is best for you.  Keep in mind that carbohydrates (carbs) and alcohol have immediate effects on your blood glucose levels. It is important to count carbs and to use alcohol carefully. This information is not intended to replace advice given to you by your health care provider. Make sure you discuss any questions you have with your health care provider. Document Revised: 08/10/2019 Document Reviewed: 08/10/2019 Elsevier Patient Education  2021 Elsevier Inc.  

## 2021-04-05 NOTE — Assessment & Plan Note (Signed)
-  does not eat often -makes poor choices when she does it -feels that the quantity of food may be low, however, unhealthy choices made -review of fasting lipid panel; pt cannot believe that numbers are still up. Since last appt in March she has resumed fish oil capsules which she was on previously in addition to -continues to refuse use of statin and plans to resume diet and exercise modification. -The patient is asked to make an attempt to improve diet and exercise patterns to aid in medical management of this problem.

## 2021-04-05 NOTE — Assessment & Plan Note (Signed)
Remains well controlled No complications with medication Pt endorses daily use of medication without difficulty

## 2021-04-05 NOTE — Assessment & Plan Note (Signed)
chroinc and uncontrolled Does not want medications at this time  Will try 67mof lifestyle interventions

## 2021-04-05 NOTE — Assessment & Plan Note (Addendum)
Pt aware that diabetes remains uncontrolled give A1c >7% Associated with HTN and HLD Pt agreeable to start a medication for diabetes at this time. After extensive conversation, plan to start Ozempic 0.25 mg, subcutaneous injection, once a week for two weeks.  If pt is without complication, plan to increase dose to 0.5 mg, SQ injection, weekly.  If side effects arise, pt to adjust dose to previously tolerated dose and call nurse line with questions. Pt plans to join [employer] for accountability and notes that she has been dealing with DM since 2003, and knows that she can make improvements.

## 2021-04-05 NOTE — Progress Notes (Signed)
Established patient visit   Patient: Veronica Butler   DOB: 07-Feb-1961   60 y.o. Female  MRN: [id number] Visit Date: 04/05/2021  Today's healthcare provider: Lavon Paganini, MD   Chief Complaint  Patient presents with   Diabetes   Hypertension   Subjective    HPI  Diabetes Mellitus Type II, Follow-up  Lab Results  Component Value Date   HGBA1C 7.4 (H) 04/03/2021   HGBA1C 7.0 (A) 11/16/2020   HGBA1C 6.6 (H) 05/24/2020   Wt Readings from Last 3 Encounters:  04/05/21 229 lb 1.6 oz (103.9 kg)  11/16/20 226 lb 14.4 oz (102.9 kg)  05/19/20 229 lb 9.6 oz (104.1 kg)   Last seen for diabetes 4 months ago.  Management since then includes no changes, patient did not want to start oral medication. Patient to work on healthy lifestyle changes. She reports fair compliance with treatment. Symptoms: Yes fatigue No foot ulcerations  Yes appetite changes No nausea  No paresthesia of the feet  No polydipsia  No polyuria No visual disturbances   No vomiting     Home blood sugar records: fasting range: 140-150's  Episodes of hypoglycemia? No   Current insulin regiment: none Most Recent Eye Exam: Jan, 2022 [facility] Current exercise: none Current diet habits: in general, an "unhealthy" diet  Pertinent Labs: Lab Results  Component Value Date   CHOL 222 (H) 04/03/2021   HDL 45 04/03/2021   LDLCALC 135 (H) 04/03/2021   TRIG 234 (H) 04/03/2021   CHOLHDL 4.9 (H) 04/03/2021   Lab Results  Component Value Date   NA 142 04/03/2021   K 4.6 04/03/2021   CREATININE 0.77 04/03/2021   GFRNONAA 91 05/24/2020   GFRAA 105 05/24/2020   GLUCOSE 163 (H) 04/03/2021     --------------------------------------------------------------------------------------------------- Hypertension, follow-up  BP Readings from Last 3 Encounters:  04/05/21 (!) 149/98  11/16/20 134/80  05/19/20 132/88   Wt Readings from Last 3 Encounters:  04/05/21 229 lb 1.6 oz (103.9 kg)  11/16/20  226 lb 14.4 oz (102.9 kg)  05/19/20 229 lb 9.6 oz (104.1 kg)     She was last seen for hypertension 4 months ago.  BP at that visit was 134/80. Management since that visit includes no changes.  She reports excellent compliance with treatment. She is not having side effects. She is following a Regular diet. She is not exercising. She does not smoke.  Use of agents associated with hypertension: none.   Outside blood pressures are stable, this morning 144/84. Symptoms: No chest pain No chest pressure  No palpitations No syncope  No dyspnea No orthopnea  No paroxysmal nocturnal dyspnea Yes lower extremity edema   Pertinent labs: Lab Results  Component Value Date   CHOL 222 (H) 04/03/2021   HDL 45 04/03/2021   LDLCALC 135 (H) 04/03/2021   TRIG 234 (H) 04/03/2021   CHOLHDL 4.9 (H) 04/03/2021   Lab Results  Component Value Date   NA 142 04/03/2021   K 4.6 04/03/2021   CREATININE 0.77 04/03/2021   GFRNONAA 91 05/24/2020   GFRAA 105 05/24/2020   GLUCOSE 163 (H) 04/03/2021     The 10-year ASCVD risk score Mikey Bussing DC Jr., et al., 2013) is: 12.2% ASCVD is the calculated percentage risk of stroke and heart attack within the next ten years.  --------------------------------------------------------------------------------------------------- Hypothyroid, follow-up  Lab Results  Component Value Date   TSH 1.300 11/16/2020   TSH 1.510 05/24/2020   TSH 1.820 03/18/2019  Wt Readings from Last 3 Encounters:  04/05/21 229 lb 1.6 oz (103.9 kg)  11/16/20 226 lb 14.4 oz (102.9 kg)  05/19/20 229 lb 9.6 oz (104.1 kg)    She was last seen for hypothyroid 4 months ago.  Management since that visit includes no changes. She reports excellent compliance with treatment. She is not having side effects.   Symptoms: Yes change in energy level Yes constipation  Yes diarrhea No heat / cold intolerance  Yes nervousness No palpitations  Yes weight changes     -----------------------------------------------------------------------------------------   Patient Active Problem List   Diagnosis Date Noted   Hypertension associated with diabetes ([place]) 04/05/2021   Multinodular thyroid 05/19/2020   Bilateral leg cramps 03/17/2019   Morbid obesity ([place]) 08/03/2018   Special screening for malignant neoplasms, colon    Internal hemorrhoids    Diverticulosis of large intestine without diverticulitis    Hypothyroidism 08/01/2017   Trigeminal neuralgia 08/01/2017   Diabetes ([place]) 08/22/2015   NASH (nonalcoholic steatohepatitis) 02/22/2015   CA cervix ([place]) 01/06/2015   Headache, migraine 01/06/2015   Neuropathy 01/06/2015   Apnea, sleep 01/06/2015   Thyroid nodule 01/06/2015   Chronic venous insufficiency 01/06/2015   Chronic urinary tract infection 02/25/2009   Allergic rhinitis 01/06/2009   Hyperlipidemia associated with type 2 diabetes mellitus ([place]) 02/19/2008   B12 deficiency 08/23/2007   Acid reflux 08/23/2007   Cannot sleep 08/23/2007   Adaptive colitis 08/23/2007   Cyst of ovary 08/23/2007   Social History   Tobacco Use   Smoking status: Never   Smokeless tobacco: Never  Substance Use Topics   Alcohol use: No   Drug use: No   Allergies  Allergen Reactions   Cortisone Other (See Comments)    High blood sugar   Tape Swelling and Dermatitis    Redness and itching.   Ciprofloxacin Rash   Floxacillin [Flucloxacillin] Rash   Levaquin [Levofloxacin In D5w] Rash   Quinolones Rash    Tendon pain as well as rash       Medications: Outpatient Medications Prior to Visit  Medication Sig   ALPHA LIPOIC ACID PO Take by mouth.   Blood Glucose Monitoring Suppl (ONE TOUCH ULTRA SYSTEM KIT) W/DEVICE KIT GLUCOMETER TEST STRIPS - Historical Medication  Check blood sugar daily ---- ULTRA ONE TOUCH  Started 11-Jul-2009 Active Comments: DX: 790.29   carbamazepine (TEGRETOL XR) 100 MG 12 hr tablet TAKE 1 TABLET BY MOUTH  DAILY   Cranberry  500 MG CAPS Take 1 capsule by mouth daily.   DIGESTIVE ENZYMES PO Take 1 capsule by mouth daily.   Magnesium 500 MG CAPS Take 2 tablets by mouth daily.    Misc Natural Products (ADRENAL) 200 MG CAPS Take 1 capsule by mouth 2 (two) times daily.    MULTIPLE VITAMIN PO Take 3 tablets by mouth daily.    Potassium 99 MG TABS Take by mouth.   Potassium Gluconate 2.5 MEQ TABS Take by mouth.   psyllium (REGULOID) 0.52 g capsule Take 0.52 g by mouth 2 (two) times daily.   SYNTHROID 25 MCG tablet TAKE 1 TABLET BY MOUTH  DAILY BEFORE BREAKFAST AND  2 TABLETS BY MOUTH ON THE  WEEKENDS   No facility-administered medications prior to visit.    Review of Systems  Constitutional:  Positive for activity change, appetite change and fatigue.  Eyes:  Negative for visual disturbance.  Respiratory:  Negative for chest tightness and shortness of breath.   Cardiovascular:  Positive for leg swelling. Negative  for chest pain and palpitations.       Non-pitting edema in bilateral lower extremities.  Gastrointestinal:  Positive for constipation and diarrhea.  Endocrine: Positive for cold intolerance and heat intolerance.  Psychiatric/Behavioral:  The patient is nervous/anxious.   All other systems reviewed and are negative.      Objective    BP (!) 149/98 (BP Location: Left Arm, Patient Position: Sitting, Cuff Size: Large)   Pulse (!) 102   Temp 98.3 F (36.8 C) (Oral)   Resp 16   Ht 5' 6"  (1.676 m)   Wt 229 lb 1.6 oz (103.9 kg)   LMP 11/05/2000   SpO2 96%   BMI 36.98 kg/m  BP Readings from Last 3 Encounters:  04/05/21 (!) 149/98  11/16/20 134/80  05/19/20 132/88   Wt Readings from Last 3 Encounters:  04/05/21 229 lb 1.6 oz (103.9 kg)  11/16/20 226 lb 14.4 oz (102.9 kg)  05/19/20 229 lb 9.6 oz (104.1 kg)       Physical Exam Vitals reviewed.  Constitutional:      General: She is not in acute distress.    Appearance: Normal appearance. She is well-developed. She is obese. She is not  diaphoretic.  HENT:     Head: Normocephalic and atraumatic.  Eyes:     General: No scleral icterus.    Conjunctiva/sclera: Conjunctivae normal.  Neck:     Thyroid: No thyromegaly.  Cardiovascular:     Rate and Rhythm: Normal rate and regular rhythm.     Pulses: Normal pulses.     Heart sounds: Normal heart sounds. No murmur heard. Pulmonary:     Effort: Pulmonary effort is normal. No respiratory distress.     Breath sounds: Normal breath sounds. No wheezing, rhonchi or rales.  Musculoskeletal:        General: Swelling present.     Cervical back: Neck supple.  Lymphadenopathy:     Cervical: No cervical adenopathy.  Skin:    General: Skin is warm and dry.     Findings: No rash.  Neurological:     Mental Status: She is alert and oriented to person, place, and time. Mental status is at baseline.  Psychiatric:        Mood and Affect: Mood normal.        Behavior: Behavior normal.     No results found for any visits on 04/05/21.  Assessment & Plan     Problem List Items Addressed This Visit       Cardiovascular and Mediastinum   Hypertension associated with diabetes ([place])    chroinc and uncontrolled Does not want medications at this time  Will try 68mof lifestyle interventions       Relevant Medications   Semaglutide,0.25 or 0.5MG/DOS, (OZEMPIC, 0.25 OR 0.5 MG/DOSE,) 2 MG/1.5ML SOPN     Endocrine   Hyperlipidemia associated with type 2 diabetes mellitus ([place]    -does not eat often -makes poor choices when she does it -feels that the quantity of food may be low, however, unhealthy choices made -review of fasting lipid panel; pt cannot believe that numbers are still up. Since last appt in March she has resumed fish oil capsules which she was on previously in addition to -continues to refuse use of statin and plans to resume diet and exercise modification. -The patient is asked to make an attempt to improve diet and exercise patterns to aid in medical management of this  problem.       Relevant Medications  Semaglutide,0.25 or 0.5MG/DOS, (OZEMPIC, 0.25 OR 0.5 MG/DOSE,) 2 MG/1.5ML SOPN   Diabetes ([place]) - Primary    Pt aware that diabetes remains uncontrolled give A1c >7% Associated with HTN and HLD Pt agreeable to start a medication for diabetes at this time. After extensive conversation, plan to start Ozempic 0.25 mg, subcutaneous injection, once a week for two weeks.  If pt is without complication, plan to increase dose to 0.5 mg, SQ injection, weekly.  If side effects arise, pt to adjust dose to previously tolerated dose and call nurse line with questions. Pt plans to join [employer] for accountability and notes that she has been dealing with DM since 2003, and knows that she can make improvements.       Relevant Medications   Semaglutide,0.25 or 0.5MG/DOS, (OZEMPIC, 0.25 OR 0.5 MG/DOSE,) 2 MG/1.5ML SOPN   Hypothyroidism    Remains well controlled No complications with medication Pt endorses daily use of medication without difficulty         Other   Morbid obesity (HCC)    BMI 36 and assoc HTN and HLD and T2dm Discussed importance of healthy weight management Discussed diet and exercise        Relevant Medications   Semaglutide,0.25 or 0.5MG/DOS, (OZEMPIC, 0.25 OR 0.5 MG/DOSE,) 2 MG/1.5ML SOPN   Pt to begin titration of medication once receiving from mail order pharmacy, beginning with 0.25 mg for two weeks, followed by 0.83m.  Return in about 3 months (around 07/06/2021) for Diabetes follow up.       I, ALavon Paganini MD, have reviewed all documentation for this visit. The documentation on 04/05/21 for the exam, diagnosis, procedures, and orders are all accurate and complete.   Ora Bollig, ADionne Bucy MD, MPH [facility]

## 2021-04-05 NOTE — Assessment & Plan Note (Signed)
BMI 36 and assoc HTN and HLD and T2dm Discussed importance of healthy weight management Discussed diet and exercise

## 2021-04-11 ENCOUNTER — Telehealth: Payer: Self-pay

## 2021-04-11 NOTE — Telephone Encounter (Signed)
Reviewed with patient denial letter from cover my meds, patient reports that she has been on Metformin previously between the years of 2010-2011 but reports she d/c because she cont not tolerate medication. Advised patient that I will try to resubmit pre-authorization with new information she has given me

## 2021-04-11 NOTE — Telephone Encounter (Signed)
Copied from [place] [id number]. Topic: General - Other >> Apr 11, 2021  1:08 PM Tessa Lerner A wrote: Reason for CRM: Patient would like to be contacted regarding prescription coordination of their Semaglutide,0.25 or 0.5MG/DOS, (OZEMPIC, 0.25 OR 0.5 MG/DOSE,)   The patient has been directed by their pharmacy to contact their PCP for further assistance regarding the prior authorization   The patient would like to be contacted by a member of staff to discuss further when possible

## 2021-04-13 ENCOUNTER — Telehealth: Payer: Self-pay | Admitting: Family Medicine

## 2021-04-13 NOTE — Telephone Encounter (Signed)
Pt is calling to request a Prior Authorization for SYNTHROID 25 MCG tablet [[id number]]  Cb- [phone]

## 2021-04-16 NOTE — Telephone Encounter (Signed)
Prior Authorization done today. Outcome: Pending

## 2021-04-16 NOTE — Telephone Encounter (Signed)
[name], do you have time to do this PA?

## 2021-04-17 NOTE — Telephone Encounter (Signed)
Thomasene Lot Key: [id number] - PA Case ID: [id number] Need help? Call us at [phone] Outcome Denied on August 1 Request Reference Number: [id number]. SYNTHROID TAB 25MCG is denied for not meeting the prior authorization requirement(s). Details of this decision are in the notice attached below or have been faxed to you. Appeals are not supported through [place]. Please refer to the fax case notice for appeals information and instructions. Drug Synthroid 25MCG tablets

## 2021-04-23 ENCOUNTER — Telehealth: Payer: Self-pay

## 2021-04-23 NOTE — Telephone Encounter (Signed)
Called patient back to see which medicine. Patient reports is the Ozempic.

## 2021-04-23 NOTE — Telephone Encounter (Signed)
Copied from [place] [id number]. Topic: General - Inquiry >> Apr 20, 2021  9:17 AM Greggory Keen D wrote: Reason for CRM: Pt called with a question regarding a new medication she is taking and it giving her reflux.  CB#  [phone]

## 2021-04-23 NOTE — Telephone Encounter (Signed)
Make sure she is on the 0.7m weekly dose for now.  Eat small, frequent meals. Can use pepcid for a few weeks with it.

## 2021-04-24 NOTE — Telephone Encounter (Signed)
Advised 

## 2021-06-14 ENCOUNTER — Other Ambulatory Visit: Payer: Self-pay | Admitting: Family Medicine

## 2021-06-15 ENCOUNTER — Telehealth: Payer: Self-pay

## 2021-06-15 NOTE — Telephone Encounter (Signed)
Copied from [place] [id number]. Topic: General - Other >> Jun 15, 2021  9:45 AM Leward Quan A wrote: Reason for CRM: Patient called in and rescheduled an appointment would lhave like something in November but schedule packed so first available is January 2023. Please advise

## 2021-06-15 NOTE — Telephone Encounter (Signed)
Scheduled first available ov.

## 2021-06-18 ENCOUNTER — Other Ambulatory Visit: Payer: Self-pay | Admitting: Family Medicine

## 2021-06-18 DIAGNOSIS — E039 Hypothyroidism, unspecified: Secondary | ICD-10-CM

## 2021-07-02 ENCOUNTER — Telehealth: Payer: Self-pay

## 2021-07-02 NOTE — Telephone Encounter (Signed)
Copied from [place] [id number]. Topic: General - Other >> Jul 02, 2021  2:10 PM Veronica Butler wrote: Reason for CRM: Pt called in stating she needed a pre-authorization for medication SYNTHROID 25 MCG tablet to her insurance. Please advise.

## 2021-07-03 NOTE — Telephone Encounter (Signed)
PA done today. Patient advised.

## 2021-07-06 ENCOUNTER — Ambulatory Visit: Payer: Self-pay | Admitting: Family Medicine

## 2021-07-10 ENCOUNTER — Other Ambulatory Visit: Payer: Self-pay

## 2021-07-10 ENCOUNTER — Ambulatory Visit (INDEPENDENT_AMBULATORY_CARE_PROVIDER_SITE_OTHER): Payer: 59 | Admitting: Family Medicine

## 2021-07-10 ENCOUNTER — Encounter: Payer: Self-pay | Admitting: Family Medicine

## 2021-07-10 VITALS — BP 112/77 | HR 83 | Temp 97.8°F | Resp 16 | Ht 66.0 in | Wt 220.0 lb

## 2021-07-10 DIAGNOSIS — Z23 Encounter for immunization: Secondary | ICD-10-CM | POA: Diagnosis not present

## 2021-07-10 DIAGNOSIS — I152 Hypertension secondary to endocrine disorders: Secondary | ICD-10-CM | POA: Diagnosis not present

## 2021-07-10 DIAGNOSIS — E1142 Type 2 diabetes mellitus with diabetic polyneuropathy: Secondary | ICD-10-CM

## 2021-07-10 DIAGNOSIS — K7581 Nonalcoholic steatohepatitis (NASH): Secondary | ICD-10-CM

## 2021-07-10 DIAGNOSIS — E039 Hypothyroidism, unspecified: Secondary | ICD-10-CM | POA: Diagnosis not present

## 2021-07-10 DIAGNOSIS — E1169 Type 2 diabetes mellitus with other specified complication: Secondary | ICD-10-CM | POA: Diagnosis not present

## 2021-07-10 DIAGNOSIS — E1159 Type 2 diabetes mellitus with other circulatory complications: Secondary | ICD-10-CM | POA: Diagnosis not present

## 2021-07-10 DIAGNOSIS — E785 Hyperlipidemia, unspecified: Secondary | ICD-10-CM

## 2021-07-10 LAB — POCT GLYCOSYLATED HEMOGLOBIN (HGB A1C)
Est. average glucose Bld gHb Est-mCnc: 123
Hemoglobin A1C: 5.9 % — AB (ref 4.0–5.6)

## 2021-07-10 LAB — POCT UA - MICROALBUMIN: Microalbumin Ur, POC: NEGATIVE mg/L

## 2021-07-10 MED ORDER — OZEMPIC (0.25 OR 0.5 MG/DOSE) 2 MG/1.5ML ~~LOC~~ SOPN: 0.2500 mg | PEN_INJECTOR | SUBCUTANEOUS | 3 refills | Status: DC

## 2021-07-10 MED ORDER — LEVOTHYROXINE SODIUM 25 MCG PO TABS: ORAL_TABLET | ORAL | 3 refills | Status: DC

## 2021-07-10 NOTE — Assessment & Plan Note (Signed)
Previously well controlled Continue Synthroid at current dose  Recheck TSH and adjust Synthroid as indicated   

## 2021-07-10 NOTE — Assessment & Plan Note (Addendum)
BMI >35 and associated with T2DM, HTN, HLD Discussed importance of healthy weight management Discussed diet and exercise

## 2021-07-10 NOTE — Assessment & Plan Note (Signed)
Well controlled Continue lifestyle management Recheck metabolic panel

## 2021-07-10 NOTE — Assessment & Plan Note (Signed)
Reviewed last lipid panel Not currently on a statin Recheck FLP and CMP Discussed diet and exercise  

## 2021-07-10 NOTE — Progress Notes (Signed)
Established patient visit   Patient: Veronica Butler   DOB: July 27, 1961   60 y.o. Female  MRN: [id number] Visit Date: 07/10/2021  Today's healthcare provider: Lavon Paganini, MD   Chief Complaint  Patient presents with   Diabetes   Hyperlipidemia   Hypothyroidism   Hypertension    Subjective    HPI  Diabetes Mellitus Type II, Follow-up  Lab Results  Component Value Date   HGBA1C 5.9 (A) 07/10/2021   HGBA1C 7.4 (H) 04/03/2021   HGBA1C 7.0 (A) 11/16/2020   Wt Readings from Last 3 Encounters:  07/10/21 220 lb (99.8 kg)  04/05/21 229 lb 1.6 oz (103.9 kg)  11/16/20 226 lb 14.4 oz (102.9 kg)   Last seen for diabetes 3 months ago.  Management since then includes no changes. She reports excellent compliance with treatment. She is not having side effects.  Symptoms: No fatigue No foot ulcerations  No appetite changes No nausea  No paresthesia of the feet  No polydipsia  No polyuria No visual disturbances   No vomiting     Home blood sugar records: fasting range: 100-120  Episodes of hypoglycemia? No    Current insulin regiment: none Most Recent Eye Exam: UTD Current exercise: none Current diet habits: in general, a "healthy" diet    Pertinent Labs: Lab Results  Component Value Date   CHOL 222 (H) 04/03/2021   HDL 45 04/03/2021   LDLCALC 135 (H) 04/03/2021   TRIG 234 (H) 04/03/2021   CHOLHDL 4.9 (H) 04/03/2021   Lab Results  Component Value Date   NA 142 04/03/2021   K 4.6 04/03/2021   CREATININE 0.77 04/03/2021   EGFR 87 04/03/2021   GFRNONAA 91 05/24/2020   GLUCOSE 163 (H) 04/03/2021     --------------------------------------------------------------------------------------------------- Lipid/Cholesterol, Follow-up  Last lipid panel Other pertinent labs  Lab Results  Component Value Date   CHOL 222 (H) 04/03/2021   HDL 45 04/03/2021   LDLCALC 135 (H) 04/03/2021   TRIG 234 (H) 04/03/2021   CHOLHDL 4.9 (H) 04/03/2021   Lab Results   Component Value Date   ALT 20 04/03/2021   AST 21 04/03/2021   PLT 251 08/04/2017   TSH 1.300 11/16/2020     She was last seen for this 3 months ago.  Management since that visit includes no changes.  She reports excellent compliance with treatment. She is not having side effects.   Symptoms: No chest pain No chest pressure/discomfort  No dyspnea No lower extremity edema  No numbness or tingling of extremity No orthopnea  No palpitations No paroxysmal nocturnal dyspnea  No speech difficulty No syncope   Current diet: in general, a "healthy" diet   Current exercise: none  The 10-year ASCVD risk score (Arnett DK, et al., 2019) is: 7.2%  --------------------------------------------------------------------------------------------------- Hypothyroid, follow-up  Lab Results  Component Value Date   TSH 1.300 11/16/2020   TSH 1.510 05/24/2020   TSH 1.820 03/18/2019   Wt Readings from Last 3 Encounters:  07/10/21 220 lb (99.8 kg)  04/05/21 229 lb 1.6 oz (103.9 kg)  11/16/20 226 lb 14.4 oz (102.9 kg)    She was last seen for hypothyroid 3 months ago.  Management since that visit includes no changes. She reports excellent compliance with treatment. She is not having side effects.   Symptoms: No change in energy level No constipation  No diarrhea No heat / cold intolerance  No nervousness No palpitations  No weight changes    -----------------------------------------------------------------------------------------  Medications: Outpatient Medications Prior to Visit  Medication Sig   ALPHA LIPOIC ACID PO Take by mouth.   Blood Glucose Monitoring Suppl (ONE TOUCH ULTRA SYSTEM KIT) W/DEVICE KIT GLUCOMETER TEST STRIPS - Historical Medication  Check blood sugar daily ---- ULTRA ONE TOUCH  Started 11-Jul-2009 Active Comments: DX: 790.29   carbamazepine (TEGRETOL XR) 100 MG 12 hr tablet TAKE 1 TABLET BY MOUTH  DAILY   Cranberry 500 MG CAPS Take 1 capsule by mouth  daily.   DIGESTIVE ENZYMES PO Take 1 capsule by mouth daily.   LUTEIN PO Take by mouth.   Magnesium 500 MG CAPS Take 2 tablets by mouth daily.    Misc Natural Products (ADRENAL) 200 MG CAPS Take 1 capsule by mouth 2 (two) times daily.    MULTIPLE VITAMIN PO Take 3 tablets by mouth daily.    Multiple Vitamins-Minerals (ZINC PO) Take by mouth.   OVER THE COUNTER MEDICATION Cholestepure Plus II   Potassium 99 MG TABS Take by mouth.   Potassium Gluconate 2.5 MEQ TABS Take by mouth.   psyllium (REGULOID) 0.52 g capsule Take 0.52 g by mouth 2 (two) times daily.   [DISCONTINUED] OZEMPIC, 0.25 OR 0.5 MG/DOSE, 2 MG/1.5ML SOPN INJECT SUBCUTANEOUSLY 0.5MG ONCE WEEKLY   [DISCONTINUED] SYNTHROID 25 MCG tablet TAKE 1 TABLET BY MOUTH  DAILY BEFORE BREAKFAST AND  2 TABLETS BY MOUTH ON THE  WEEKENDS   No facility-administered medications prior to visit.    Review of Systems - per HPI      Objective    BP 112/77 Comment: home reading  Pulse 83   Temp 97.8 F (36.6 C) (Oral)   Resp 16   Ht _0  (1.676 m)   Wt 220 lb (99.8 kg)   LMP 11/05/2000   SpO2 98%   BMI 35.51 kg/m  BP Readings from Last 3 Encounters:  07/10/21 112/77  04/05/21 (!) 149/98  11/16/20 134/80   Wt Readings from Last 3 Encounters:  07/10/21 220 lb (99.8 kg)  04/05/21 229 lb 1.6 oz (103.9 kg)  11/16/20 226 lb 14.4 oz (102.9 kg)      Physical Exam Vitals reviewed.  Constitutional:      General: She is not in acute distress.    Appearance: Normal appearance. She is well-developed. She is not diaphoretic.  HENT:     Head: Normocephalic and atraumatic.  Eyes:     General: No scleral icterus.    Conjunctiva/sclera: Conjunctivae normal.  Neck:     Thyroid: No thyromegaly.  Cardiovascular:     Rate and Rhythm: Normal rate and regular rhythm.     Pulses: Normal pulses.     Heart sounds: Normal heart sounds. No murmur heard. Pulmonary:     Effort: Pulmonary effort is normal. No respiratory distress.     Breath  sounds: Normal breath sounds. No wheezing, rhonchi or rales.  Musculoskeletal:     Cervical back: Neck supple.     Right lower leg: No edema.     Left lower leg: No edema.  Lymphadenopathy:     Cervical: No cervical adenopathy.  Skin:    General: Skin is warm and dry.     Findings: No rash.  Neurological:     Mental Status: She is alert and oriented to person, place, and time. Mental status is at baseline.  Psychiatric:        Mood and Affect: Mood normal.        Behavior: Behavior normal.  Results for orders placed or performed in visit on 07/10/21  POCT glycosylated hemoglobin (Hb A1C)  Result Value Ref Range   Hemoglobin A1C 5.9 (A) 4.0 - 5.6 %   Est. average glucose Bld gHb Est-mCnc 123     Assessment & Plan     Problem List Items Addressed This Visit       Cardiovascular and Mediastinum   Hypertension associated with diabetes ([place]) - Primary    Well controlled Continue lifestyle management Recheck metabolic panel      Relevant Medications   Semaglutide,0.25 or 0.5MG/DOS, (OZEMPIC, 0.25 OR 0.5 MG/DOSE,) 2 MG/1.5ML SOPN   Other Relevant Orders   POCT glycosylated hemoglobin (Hb A1C) (Completed)   Comprehensive metabolic panel     Digestive   NASH (nonalcoholic steatohepatitis)    Continue to monitor annual LFTs      Relevant Orders   Comprehensive metabolic panel     Endocrine   Hyperlipidemia associated with type 2 diabetes mellitus ([place])    Reviewed last lipid panel Not currently on a statin Recheck FLP and CMP Discussed diet and exercise       Relevant Medications   Semaglutide,0.25 or 0.5MG/DOS, (OZEMPIC, 0.25 OR 0.5 MG/DOSE,) 2 MG/1.5ML SOPN   Other Relevant Orders   Comprehensive metabolic panel   Lipid panel   Diabetes ([place])    Improved significantly and well controlled Continue ozempic 0.80m weekly Continue lifestyle changes Foot exam and urine micro today F/u in 627mnd repeat A1c      Relevant Medications   Semaglutide,0.25 or  0.5MG/DOS, (OZEMPIC, 0.25 OR 0.5 MG/DOSE,) 2 MG/1.5ML SOPN   Other Relevant Orders   POCT UA - Microalbumin   Hypothyroidism    Previously well controlled Continue Synthroid at current dose  Recheck TSH and adjust Synthroid as indicated        Relevant Medications   levothyroxine (SYNTHROID) 25 MCG tablet   Other Relevant Orders   TSH + free T4     Other   Morbid obesity (HCC)    BMI >35 and associated with T2DM, HTN, HLD Discussed importance of healthy weight management Discussed diet and exercise       Relevant Medications   Semaglutide,0.25 or 0.5MG/DOS, (OZEMPIC, 0.25 OR 0.5 MG/DOSE,) 2 MG/1.5ML SOPN     Return in about 6 months (around 01/08/2022) for CPE.      I, AnLavon PaganiniMD, have reviewed all documentation for this visit. The documentation on 07/10/21 for the exam, diagnosis, procedures, and orders are all accurate and complete.   Ginnie Marich, AnDionne BucyMD, MPH [facility]

## 2021-07-10 NOTE — Assessment & Plan Note (Signed)
Continue to monitor annual LFTs

## 2021-07-10 NOTE — Assessment & Plan Note (Signed)
Improved significantly and well controlled Continue ozempic 0.32m weekly Continue lifestyle changes Foot exam and urine micro today F/u in 641mnd repeat A1c

## 2021-07-11 LAB — COMPREHENSIVE METABOLIC PANEL
ALT: 24 IU/L (ref 0–32)
AST: 25 IU/L (ref 0–40)
Albumin/Globulin Ratio: 2.1 (ref 1.2–2.2)
Albumin: 4.7 g/dL (ref 3.8–4.8)
Alkaline Phosphatase: 88 IU/L (ref 44–121)
BUN/Creatinine Ratio: 18 (ref 12–28)
BUN: 15 mg/dL (ref 8–27)
Bilirubin Total: 0.4 mg/dL (ref 0.0–1.2)
CO2: 25 mmol/L (ref 20–29)
Calcium: 9.3 mg/dL (ref 8.7–10.3)
Chloride: 103 mmol/L (ref 96–106)
Creatinine, Ser: 0.83 mg/dL (ref 0.57–1.00)
Globulin, Total: 2.2 g/dL (ref 1.5–4.5)
Glucose: 118 mg/dL — ABNORMAL HIGH (ref 70–99)
Potassium: 4.8 mmol/L (ref 3.5–5.2)
Sodium: 141 mmol/L (ref 134–144)
Total Protein: 6.9 g/dL (ref 6.0–8.5)
eGFR: 80 mL/min/{1.73_m2} (ref >59)

## 2021-07-11 LAB — LIPID PANEL
Chol/HDL Ratio: 4.2 ratio (ref 0.0–4.4)
Cholesterol, Total: 207 mg/dL — ABNORMAL HIGH (ref 100–199)
HDL: 49 mg/dL (ref >39)
LDL Chol Calc (NIH): 129 mg/dL — ABNORMAL HIGH (ref 0–99)
Triglycerides: 166 mg/dL — ABNORMAL HIGH (ref 0–149)
VLDL Cholesterol Cal: 29 mg/dL (ref 5–40)

## 2021-07-11 LAB — TSH+FREE T4
Free T4: 1.2 ng/dL (ref 0.82–1.77)
TSH: 1.7 u[IU]/mL (ref 0.450–4.500)

## 2021-07-26 ENCOUNTER — Other Ambulatory Visit: Payer: Self-pay | Admitting: Family Medicine

## 2021-07-26 DIAGNOSIS — G500 Trigeminal neuralgia: Secondary | ICD-10-CM

## 2021-07-27 NOTE — Telephone Encounter (Signed)
Requested medications are due for refill today yes  Requested medications are on the active medication list yes  Last refill 06/01/21  Last visit 07/10/21  Future visit scheduled 01/07/22  Notes to clinic failed protocol due to labs are from 2017 and 2018, please assess.  Requested Prescriptions  Pending Prescriptions Disp Refills   carbamazepine (TEGRETOL XR) 100 MG 12 hr tablet [Pharmacy Med Name: CARBAMAZEPINE  100MG  TAB  EXTENDED RELEASE] 90 tablet 3    Sig: TAKE 1 TABLET BY MOUTH  DAILY     Not Delegated - Neurology:  Anticonvulsants - carbamazepine Failed - 07/26/2021 10:30 PM      Failed - This refill cannot be delegated      Failed - Carbamazepine (serum) in normal range and within 360 days    No results found for: CBMZ, LABCARB        Failed - WBC in normal range and within 90 days    WBC  Date Value Ref Range Status  08/04/2017 9.3 3.8 - 10.8 Thousand/uL Final          Failed - PLT in normal range and within 90 days    Platelets  Date Value Ref Range Status  08/04/2017 251 140 - 400 Thousand/uL Final  01/16/2016 254 150 - 379 x10E3/uL Final          Failed - HGB in normal range and within 90 days    Hemoglobin  Date Value Ref Range Status  08/04/2017 14.2 11.7 - 15.5 g/dL Final  01/16/2016 14.6 11.1 - 15.9 g/dL Final          Failed - HCT in normal range and within 90 days    HCT  Date Value Ref Range Status  08/04/2017 42.0 35.0 - 45.0 % Final   Hematocrit  Date Value Ref Range Status  01/16/2016 43.4 34.0 - 46.6 % Final          Passed - AST in normal range and within 90 days    AST  Date Value Ref Range Status  07/10/2021 25 0 - 40 IU/L Final          Passed - ALT in normal range and within 90 days    ALT  Date Value Ref Range Status  07/10/2021 24 0 - 32 IU/L Final          Passed - Na in normal range and within 90 days    Sodium  Date Value Ref Range Status  07/10/2021 141 134 - 144 mmol/L Final          Passed - Valid  encounter within last 12 months    Recent Outpatient Visits           2 weeks ago Hypertension associated with diabetes [facility])   [place], Dionne Bucy, MD   3 months ago Type 2 diabetes mellitus with diabetic polyneuropathy, without long-term current use of insulin ([place])   [facility], Dionne Bucy, MD   8 months ago Type 2 diabetes mellitus with diabetic polyneuropathy, without long-term current use of insulin [facility])   [facility], Dionne Bucy, MD   1 year ago Encounter for annual physical exam   [facility] [place], Dionne Bucy, MD   2 years ago Type 2 diabetes mellitus with diabetic polyneuropathy, without long-term current use of insulin [facility])   [facility], Dionne Bucy, MD       Future Appointments  In 5 months Bacigalupo, Dionne Bucy, MD [facility] [place]

## 2021-08-28 ENCOUNTER — Ambulatory Visit: Payer: 59 | Admitting: Family Medicine

## 2021-09-04 IMAGING — US US THYROID
1 series · 13 of 25 positions shown · non-contrast
Comparison: Report from a study dated 01/15/2010. Images currently
cannot be retrieved.

CLINICAL DATA: Goiter.

EXAM:
THYROID ULTRASOUND
TECHNIQUE: Ultrasound examination of the thyroid gland and adjacent soft
tissues was performed.

[Series 1: us thyroid · 0.07mm/px · 13 of 38 slices shown]
[im 1/38]
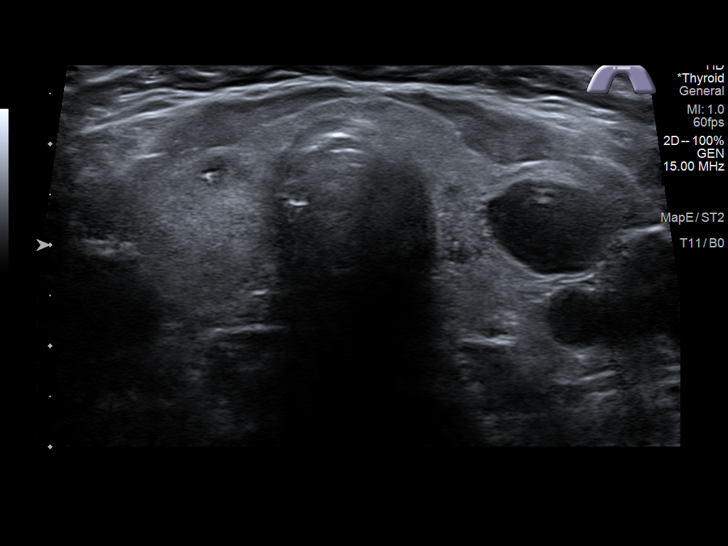
[im 4/38]
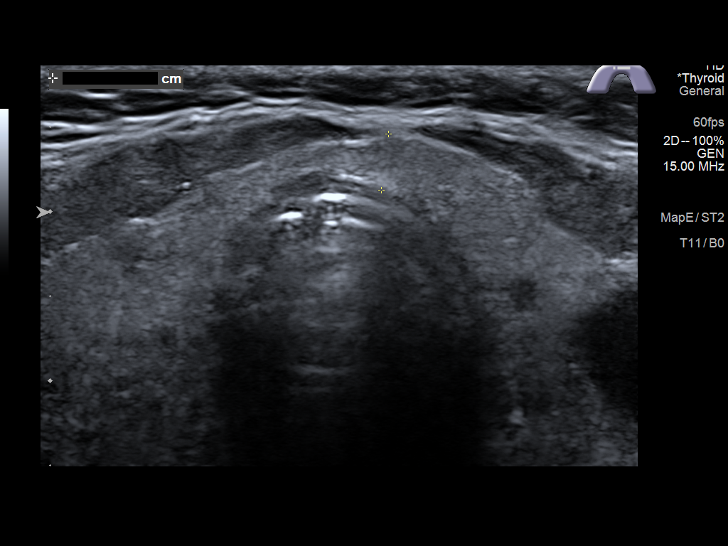
[im 7/38]
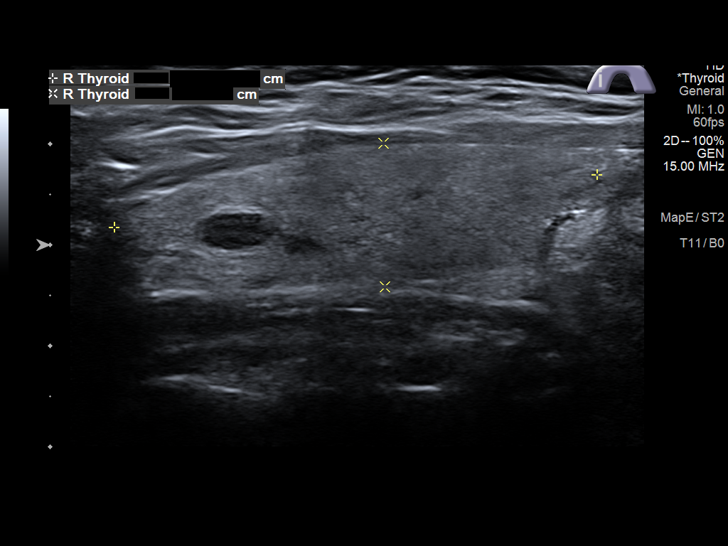
[im 10/38]
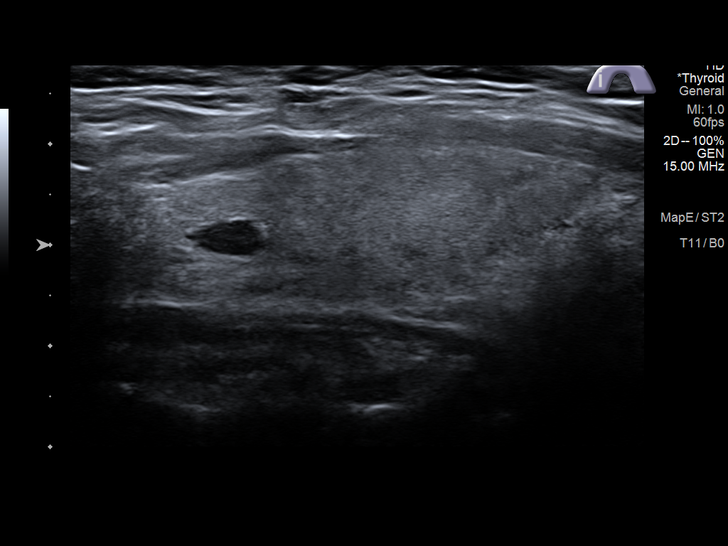
[im 13/38]
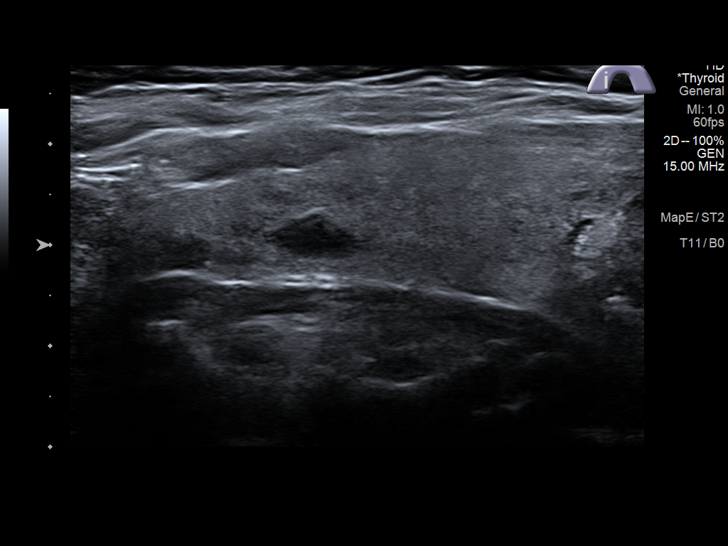
[im 16/38]
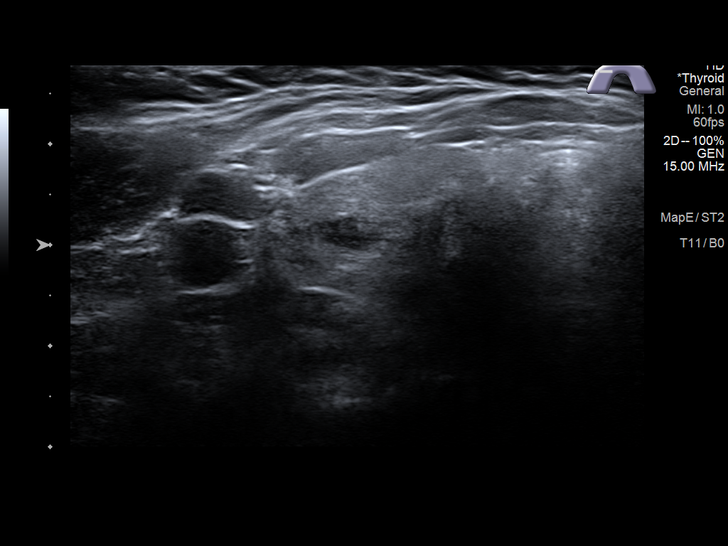
[im 19/38]
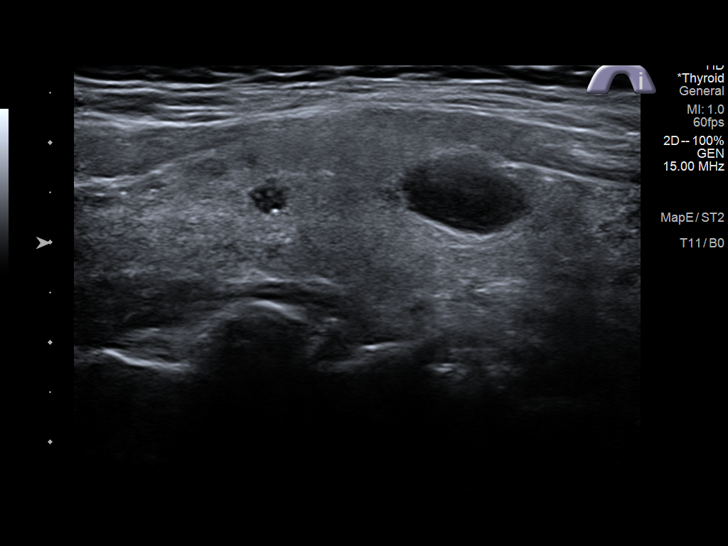
[im 22/38]
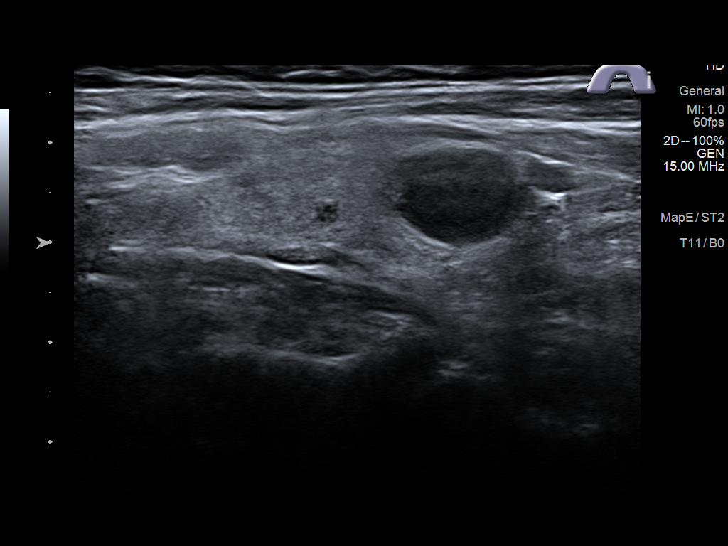
[im 25/38]
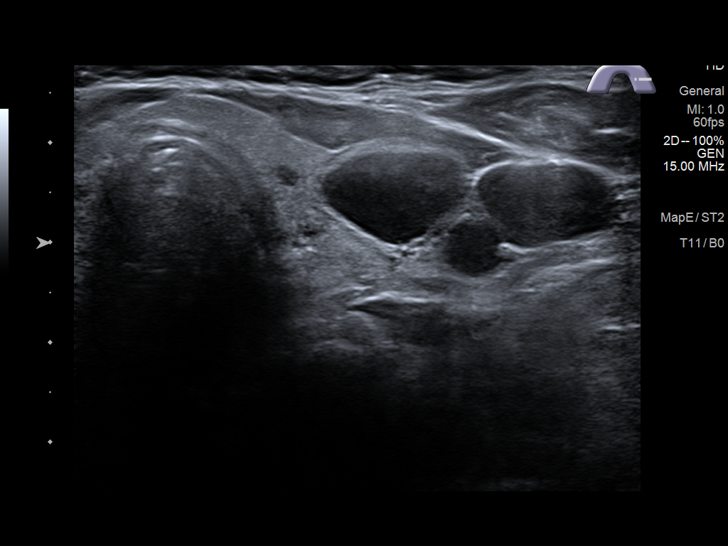
[im 28/38]
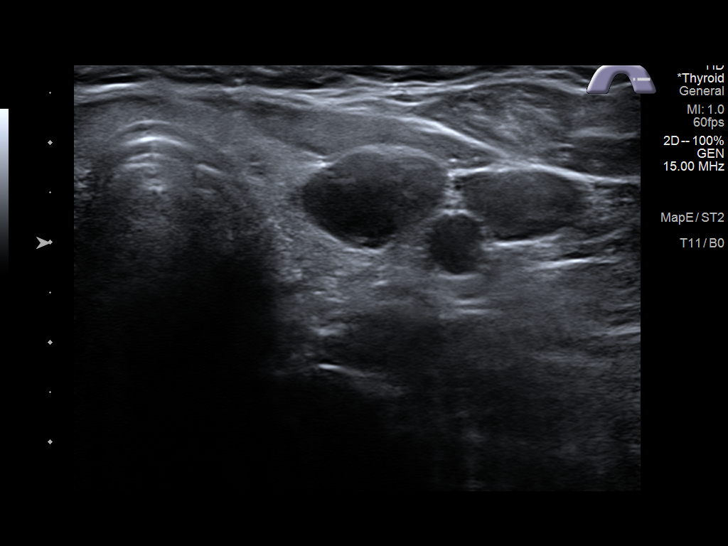
[im 31/38]
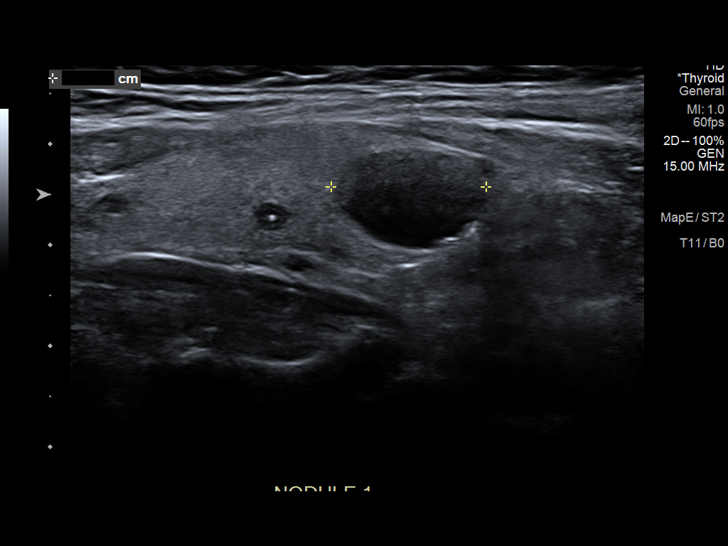
[im 34/38]
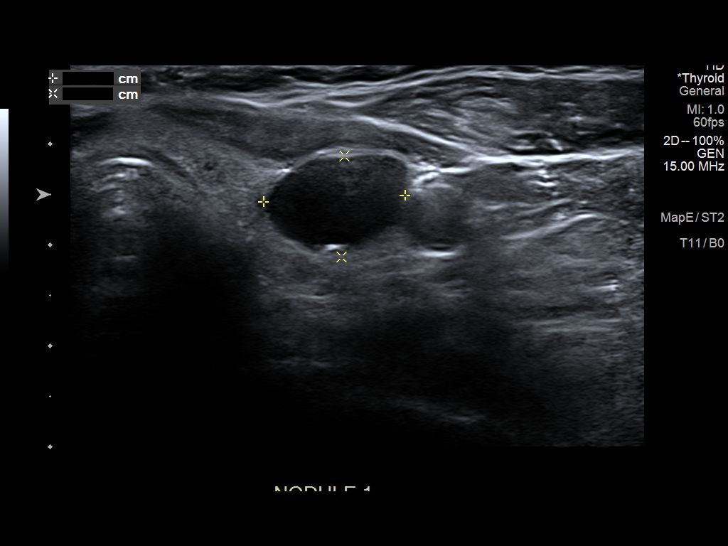
[im 38/38]
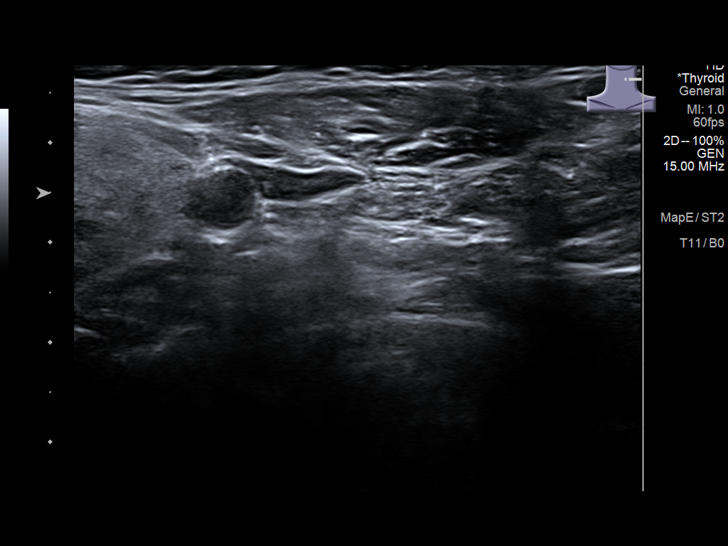

[13 of 25 positions shown; findings below may reference images not displayed]

FINDINGS: Parenchymal Echotexture: Moderately heterogenous

Isthmus: 0.3 cm

Right lobe: 4.8 x 1.4 x 2.2 cm

Left lobe: 5.1 x 1.7 x 1.9 cm

_________________________________________________________

Estimated total number of nodules >/= 1 cm: 1

Number of spongiform nodules >/=  2 cm not described below (TR1): 0

Number of mixed cystic and solid nodules >/= 1.5 cm not described
below (TR2): 0

_________________________________________________________

Nodule # 1:

Location: Left; Inferior

Maximum size: 1.5 cm; Other 2 dimensions: 1.4 x 1.0 cm

Composition: cystic/almost completely cystic (0)

Echogenicity: anechoic (0)

Shape: not taller-than-wide (0)

Margins: smooth (0)

Echogenic foci: none (0)

ACR TI-RADS total points: 0.

ACR TI-RADS risk category: TR1 (0-1 points).

ACR TI-RADS recommendations:

This nodule does NOT meet TI-RADS criteria for biopsy or dedicated
follow-up.

_________________________________________________________

There are some other scattered subcentimeter cysts bilaterally all
of which appear benign and do not meet criteria for biopsy or
dedicated follow-up.
IMPRESSION: 1.5 cm left inferior thyroid cyst does not meet criteria for biopsy
or follow-up. Other scattered subcentimeter cysts in both lobes also
do not meet criteria for biopsy or follow-up.

The above is in keeping with the ACR TI-RADS recommendations - [HOSPITAL] 8334;[DATE].

## 2021-09-24 ENCOUNTER — Ambulatory Visit: Payer: 59 | Admitting: Family Medicine

## 2021-10-09 ENCOUNTER — Other Ambulatory Visit: Payer: Self-pay | Admitting: Family Medicine

## 2021-10-09 MED ORDER — LEVOTHYROXINE SODIUM 25 MCG PO TABS: ORAL_TABLET | ORAL | 2 refills | Status: DC

## 2021-10-09 NOTE — Telephone Encounter (Signed)
Requested Prescriptions  Pending Prescriptions Disp Refills   levothyroxine (SYNTHROID) 25 MCG tablet 117 tablet 2    Sig: TAKE 1 TABLET BY MOUTH  DAILY BEFORE BREAKFAST AND  2 TABLETS BY MOUTH ON THE  WEEKENDS     Endocrinology:  Hypothyroid Agents Failed - 10/09/2021  4:44 PM      Failed - TSH needs to be rechecked within 3 months after an abnormal result. Refill until TSH is due.      Passed - TSH in normal range and within 360 days    TSH  Date Value Ref Range Status  07/10/2021 1.700 0.450 - 4.500 uIU/mL Final         Passed - Valid encounter within last 12 months    Recent Outpatient Visits          3 months ago Hypertension associated with diabetes [facility])   [facility] [place], Dionne Bucy, MD   6 months ago Type 2 diabetes mellitus with diabetic polyneuropathy, without long-term current use of insulin [facility])   [facility], Dionne Bucy, MD   10 months ago Type 2 diabetes mellitus with diabetic polyneuropathy, without long-term current use of insulin [facility])   [facility], Dionne Bucy, MD   1 year ago Encounter for annual physical exam   [facility] [place], Dionne Bucy, MD   2 years ago Type 2 diabetes mellitus with diabetic polyneuropathy, without long-term current use of insulin [facility])   [place], Dionne Bucy, MD      Future Appointments            In 3 months Bacigalupo, Dionne Bucy, MD [facility]

## 2021-10-09 NOTE — Telephone Encounter (Signed)
Medication Refill - Medication: Synthroid or generic.Marland Kitchen Pt changed insurance   she is now on [employer]  Has the patient contacted their pharmacy? No. (Agent: If no, request that the patient contact the pharmacy for the refill. If patient does not wish to contact the pharmacy document the reason why and proceed with request.) (Agent: If yes, when and what did the pharmacy advise?)  Preferred Pharmacy (with phone number or street name): [facility] Has the patient been seen for an appointment in the last year OR does the patient have an upcoming appointment? Yes.    Agent: Please be advised that RX refills may take up to 3 business days. We ask that you follow-up with your pharmacy.

## 2021-11-01 ENCOUNTER — Telehealth: Payer: Self-pay

## 2021-11-01 NOTE — Telephone Encounter (Signed)
Copied from [place] [id number]. Topic: General - Other >> Nov 01, 2021  4:13 PM Tessa Lerner A wrote: Reason for CRM: The patient would like to speak with a member of staff when possible  The patient is concerned with recent shortages of Semaglutide,0.25 or 0.5MG /DOS, (OZEMPIC, 0.25 OR 0.5 MG/DOSE,) 2 MG/1.5ML SOPN [[id number]]  and would like to discuss their concerns further   The patient would like to know that an alternative is in place for them to be prescribed in the event that the medication cannot be filled   Please contact further when possible

## 2021-11-01 NOTE — Telephone Encounter (Signed)
We have been able to get all of the patients Ozempic as they need. The shortage is improving.  If unable to fill at your pharmacy, check nearby pharmacies. There are alternatives in the class, but we may not even run into any issue

## 2021-11-05 NOTE — Telephone Encounter (Signed)
Patient advised as below.  

## 2021-11-08 DIAGNOSIS — H5203 Hypermetropia, bilateral: Secondary | ICD-10-CM | POA: Diagnosis not present

## 2021-11-08 DIAGNOSIS — E119 Type 2 diabetes mellitus without complications: Secondary | ICD-10-CM | POA: Diagnosis not present

## 2021-11-08 DIAGNOSIS — H52223 Regular astigmatism, bilateral: Secondary | ICD-10-CM | POA: Diagnosis not present

## 2021-11-08 DIAGNOSIS — H524 Presbyopia: Secondary | ICD-10-CM | POA: Diagnosis not present

## 2021-11-08 DIAGNOSIS — H25043 Posterior subcapsular polar age-related cataract, bilateral: Secondary | ICD-10-CM | POA: Diagnosis not present

## 2021-11-08 LAB — HM DIABETES EYE EXAM

## 2021-11-28 DIAGNOSIS — M222X1 Patellofemoral disorders, right knee: Secondary | ICD-10-CM | POA: Diagnosis not present

## 2021-12-19 DIAGNOSIS — Z1231 Encounter for screening mammogram for malignant neoplasm of breast: Secondary | ICD-10-CM | POA: Diagnosis not present

## 2021-12-19 DIAGNOSIS — Z1331 Encounter for screening for depression: Secondary | ICD-10-CM | POA: Diagnosis not present

## 2021-12-19 DIAGNOSIS — Z01419 Encounter for gynecological examination (general) (routine) without abnormal findings: Secondary | ICD-10-CM | POA: Diagnosis not present

## 2021-12-21 ENCOUNTER — Other Ambulatory Visit: Payer: Self-pay | Admitting: Obstetrics and Gynecology

## 2021-12-21 DIAGNOSIS — Z1231 Encounter for screening mammogram for malignant neoplasm of breast: Secondary | ICD-10-CM

## 2022-01-07 ENCOUNTER — Encounter: Payer: 59 | Admitting: Family Medicine

## 2022-01-25 ENCOUNTER — Ambulatory Visit
Admission: RE | Admit: 2022-01-25 | Discharge: 2022-01-25 | Disposition: A | Discharge status: Home / Self Care | Payer: BC Managed Care – PPO | Source: Ambulatory Visit | Attending: Obstetrics and Gynecology | Admitting: Obstetrics and Gynecology

## 2022-01-25 DIAGNOSIS — Z1231 Encounter for screening mammogram for malignant neoplasm of breast: Secondary | ICD-10-CM | POA: Insufficient documentation

## 2022-01-29 ENCOUNTER — Inpatient Hospital Stay
Admission: RE | Admit: 2022-01-29 | Discharge: 2022-01-29 | Disposition: A | Discharge status: Home / Self Care | Payer: Self-pay | Source: Ambulatory Visit | Attending: *Deleted | Admitting: *Deleted

## 2022-01-29 ENCOUNTER — Other Ambulatory Visit: Payer: Self-pay | Admitting: *Deleted

## 2022-01-29 DIAGNOSIS — Z1231 Encounter for screening mammogram for malignant neoplasm of breast: Secondary | ICD-10-CM

## 2022-02-20 ENCOUNTER — Telehealth: Payer: Self-pay

## 2022-02-20 NOTE — Telephone Encounter (Signed)
Copied from [place] [id number]. Topic: General - Other >> Feb 20, 2022 12:05 PM Tessa Lerner A wrote: Reason for CRM: The patient has called to request orders for their regular testing, the patient would like to ensure that their thyroid and A1C are checked   Please contact the patient further when possible

## 2022-02-21 NOTE — Telephone Encounter (Signed)
Patient is scheduled to come in until 06/20/2022.  Please advise which labs to order for patient or if she needs to be seen before.  Last office visit was 07/10/2021 She had several appointments canceled due to rescheduling from providers schedule.

## 2022-02-21 NOTE — Telephone Encounter (Signed)
She was supposed to be seen in April. I don't know why it is pushed out to October. She just needs to be seen sooner. Her labs would be part of the visit for all chronic conditions.

## 2022-02-22 NOTE — Progress Notes (Unsigned)
  I,[name],acting as a scribe for Angela Bacigalupo, MD.,have documented all relevant documentation on the behalf of Angela Bacigalupo, MD,as directed by  Angela Bacigalupo, MD while in the presence of Angela Bacigalupo, MD.   Complete physical exam   Patient: Veronica Butler   DOB: 11/20/1960   61 y.o. Female  MRN: [id number] Visit Date: 02/25/2022  Today's healthcare provider: Angela Bacigalupo, MD   Chief Complaint  Patient presents with  . Annual Exam   Subjective    Veronica Butler is a 61 y.o. female who presents today for a complete physical exam.  She reports consuming a general diet.  Physically active with home remodel.  She generally feels well. She reports sleeping well. She does not have additional problems to discuss today.  HPI  Patient followed by GYN  Past Medical History:  Diagnosis Date  . Cancer (HCC) 2002   Cervical CA with hysterectomy.  . Diabetes (HCC) 08/22/2015  . Dysplastic nevus 11/02/2019   Left spinal mid back. Moderate atypia, peripheral margin involved  . Dysplastic nevus 11/02/2019   Right spinal mid back. Severe atypia, peripheral margin involved. Excised 11/29/2019, margins free.  . Dysplastic nevus 05/01/2010   Right medial lower calf. Mild to moderate atypia, extends to one edge.  . Heart murmur   . Hx of dysplastic nevus 2007   multiple sites   . Hx of dysplastic nevus 2007   multiple sites   . Hypertension associated with diabetes (HCC) 04/05/2021  . Migraines   . PONV (postoperative nausea and vomiting)   . Sleep apnea    CPAP, recently stopped using due to 50lb weight loss  . Trigeminal neuralgia    Past Surgical History:  Procedure Laterality Date  . ABDOMINAL HYSTERECTOMY  2002   still has ovaries  . CHOLECYSTECTOMY  1998  . COLONOSCOPY WITH PROPOFOL N/A 10/28/2017   Procedure: COLONOSCOPY WITH PROPOFOL;  Surgeon: Tahiliani, Varnita B, MD;  Location: [facility];  Service: Endoscopy;  Laterality: N/A;  . HERNIA  REPAIR     along with gallbladder surgery  . navel cyst  1980'   removed  . TONSILLECTOMY  at age 8   Social History   Socioeconomic History  . Marital status: Married    Spouse name: Not on file  . Number of children: Not on file  . Years of education: Not on file  . Highest education level: Not on file  Occupational History  . Not on file  Tobacco Use  . Smoking status: Never  . Smokeless tobacco: Never  Substance and Sexual Activity  . Alcohol use: No  . Drug use: No  . Sexual activity: Not on file  Other Topics Concern  . Not on file  Social History Narrative  . Not on file   Social Determinants of Health   Financial Resource Strain: Not on file  Food Insecurity: Not on file  Transportation Needs: Not on file  Physical Activity: Not on file  Stress: Not on file  Social Connections: Not on file  Intimate Partner Violence: Not on file   Family Status  Relation Name Status  . Father  Deceased  . Mother  Alive  . Sister  Alive  . MGM  Deceased   Family History  Problem Relation Age of Onset  . Cancer Father   . Alcohol abuse Father   . Hypertension Mother   . Sarcoidosis Sister   . Cancer Maternal Grandmother          colon  . Dementia Maternal Grandmother    Allergies  Allergen Reactions  . Cortisone Other (See Comments)    High blood sugar  . Tape Swelling and Dermatitis    Redness and itching.  . Ciprofloxacin Rash  . Floxacillin [Floxacillin (Flucloxacillin)] Rash  . Levaquin [Levofloxacin In D5w] Rash  . Quinolones Rash    Tendon pain as well as rash    Patient Care Team: Virginia Crews, MD as PCP - General (Family Medicine)   Medications: Outpatient Medications Prior to Visit  Medication Sig  . ALPHA LIPOIC ACID PO Take by mouth.  . Blood Glucose Monitoring Suppl (ONE TOUCH ULTRA SYSTEM KIT) W/DEVICE KIT GLUCOMETER TEST STRIPS - Historical Medication  Check blood sugar daily ---- ULTRA ONE TOUCH  Started 11-Jul-2009 Active Comments:  DX: 790.29  . carbamazepine (TEGRETOL XR) 100 MG 12 hr tablet TAKE 1 TABLET BY MOUTH  DAILY  . Cranberry 500 MG CAPS Take 1 capsule by mouth daily.  Marland Kitchen DIGESTIVE ENZYMES PO Take 1 capsule by mouth daily.  Marland Kitchen levothyroxine (SYNTHROID) 25 MCG tablet TAKE 1 TABLET BY MOUTH  DAILY BEFORE BREAKFAST AND  2 TABLETS BY MOUTH ON THE  WEEKENDS  . LUTEIN PO Take by mouth.  . Magnesium 500 MG CAPS Take 2 tablets by mouth daily.   . Misc Natural Products (ADRENAL) 200 MG CAPS Take 1 capsule by mouth 2 (two) times daily.   . MULTIPLE VITAMIN PO Take 3 tablets by mouth daily.   . Multiple Vitamins-Minerals (ZINC PO) Take by mouth.  Marland Kitchen OVER THE COUNTER MEDICATION Take 2 tablets by mouth daily in the afternoon. EMMA  . Semaglutide,0.25 or 0.5MG/DOS, (OZEMPIC, 0.25 OR 0.5 MG/DOSE,) 2 MG/1.5ML SOPN Inject 0.25 mg into the skin once a week.  . [DISCONTINUED] OVER THE COUNTER MEDICATION Cholestepure Plus II  . [DISCONTINUED] Potassium 99 MG TABS Take by mouth.  . [DISCONTINUED] Potassium Gluconate 2.5 MEQ TABS Take by mouth.  . [DISCONTINUED] psyllium (REGULOID) 0.52 g capsule Take 0.52 g by mouth 2 (two) times daily.   No facility-administered medications prior to visit.    Review of Systems  Endocrine: Positive for heat intolerance.  Musculoskeletal:  Positive for arthralgias and myalgias.  All other systems reviewed and are negative.   Last CBC Lab Results  Component Value Date   WBC 9.3 08/04/2017   HGB 14.2 08/04/2017   HCT 42.0 08/04/2017   MCV 89.4 08/04/2017   MCH 30.2 08/04/2017   RDW 11.9 08/04/2017   PLT 251 11/57/2620   Last metabolic panel Lab Results  Component Value Date   GLUCOSE 118 (H) 07/10/2021   NA 141 07/10/2021   K 4.8 07/10/2021   CL 103 07/10/2021   CO2 25 07/10/2021   BUN 15 07/10/2021   CREATININE 0.83 07/10/2021   EGFR 80 07/10/2021   CALCIUM 9.3 07/10/2021   PHOS 3.5 03/18/2019   PROT 6.9 07/10/2021   ALBUMIN 4.7 07/10/2021   LABGLOB 2.2 07/10/2021   AGRATIO  2.1 07/10/2021   BILITOT 0.4 07/10/2021   ALKPHOS 88 07/10/2021   AST 25 07/10/2021   ALT 24 07/10/2021   Last lipids Lab Results  Component Value Date   CHOL 207 (H) 07/10/2021   HDL 49 07/10/2021   LDLCALC 129 (H) 07/10/2021   TRIG 166 (H) 07/10/2021   CHOLHDL 4.2 07/10/2021   Last hemoglobin A1c Lab Results  Component Value Date   HGBA1C 5.9 (A) 07/10/2021   Last thyroid functions Lab Results  Component Value  Date   TSH 1.700 07/10/2021   Last vitamin B12 and Folate Lab Results  Component Value Date   VITAMINB12 1,269 (H) 05/24/2020      Objective     BP 120/80 Comment: home readings  Pulse 80   Temp 97.8 F (36.6 C) (Oral)   Resp 16   Wt 213 lb 3.2 oz (96.7 kg)   LMP 11/05/2000   BMI 34.41 kg/m  BP Readings from Last 3 Encounters:  02/25/22 120/80  07/10/21 112/77  04/05/21 (!) 149/98   Wt Readings from Last 3 Encounters:  02/25/22 213 lb 3.2 oz (96.7 kg)  07/10/21 220 lb (99.8 kg)  04/05/21 229 lb 1.6 oz (103.9 kg)     Physical Exam Vitals reviewed.  Constitutional:      General: She is not in acute distress.    Appearance: Normal appearance. She is well-developed. She is not diaphoretic.  HENT:     Head: Normocephalic and atraumatic.     Right Ear: Tympanic membrane, ear canal and external ear normal.     Left Ear: Tympanic membrane, ear canal and external ear normal.     Nose: Nose normal.     Mouth/Throat:     Mouth: Mucous membranes are moist.     Pharynx: Oropharynx is clear. No oropharyngeal exudate.  Eyes:     General: No scleral icterus.    Conjunctiva/sclera: Conjunctivae normal.     Pupils: Pupils are equal, round, and reactive to light.  Neck:     Thyroid: No thyromegaly.  Cardiovascular:     Rate and Rhythm: Normal rate and regular rhythm.     Pulses: Normal pulses.     Heart sounds: Normal heart sounds. No murmur heard. Pulmonary:     Effort: Pulmonary effort is normal. No respiratory distress.     Breath sounds: Normal  breath sounds. No wheezing or rales.  Abdominal:     General: There is no distension.     Palpations: Abdomen is soft.     Tenderness: There is no abdominal tenderness.  Musculoskeletal:        General: No deformity.     Cervical back: Neck supple.     Right lower leg: No edema.     Left lower leg: No edema.  Lymphadenopathy:     Cervical: No cervical adenopathy.  Skin:    General: Skin is warm and dry.     Findings: No rash.  Neurological:     Mental Status: She is alert and oriented to person, place, and time. Mental status is at baseline.     Gait: Gait normal.  Psychiatric:        Mood and Affect: Mood normal.        Behavior: Behavior normal.        Thought Content: Thought content normal.     Last depression screening scores    02/25/2022   10:44 AM 07/10/2021    8:26 AM 05/19/2020   10:53 AM  PHQ 2/9 Scores  PHQ - 2 Score 0 0 0  PHQ- 9 Score 0 2    Last fall risk screening    02/25/2022   10:44 AM  Fall Risk   Falls in the past year? 0  Number falls in past yr: 0  Injury with Fall? 0  Risk for fall due to : No Fall Risks  Follow up Falls evaluation completed   Last Audit-C alcohol use screening    02/25/2022   10:44 AM  Alcohol  Use Disorder Test (AUDIT)  1. How often do you have a drink containing alcohol? 0  2. How many drinks containing alcohol do you have on a typical day when you are drinking? 0  3. How often do you have six or more drinks on one occasion? 0  AUDIT-C Score 0   A score of 3 or more in women, and 4 or more in men indicates increased risk for alcohol abuse, EXCEPT if all of the points are from question 1   No results found for any visits on 02/25/22.  Assessment & Plan    Routine Health Maintenance and Physical Exam  Exercise Activities and Dietary recommendations  Goals   None     Immunization History  Administered Date(s) Administered  . Influenza,inj,Quad PF,6+ Mos 06/01/2015, 08/01/2017, 07/17/2018, 05/19/2020, 07/10/2021   . Influenza,inj,quad, With Preservative 06/16/2020  . PFIZER(Purple Top)SARS-COV-2 Vaccination 12/13/2019, 01/05/2020, 09/13/2020  . Pfizer Covid-19 Vaccine Bivalent Booster 12yrs & up 08/03/2021  . Pneumococcal Polysaccharide-23 08/01/2017  . Td 01/30/2018  . Tdap 08/19/2007  . Unspecified SARS-COV-2 Vaccination 12/16/2019, 01/15/2020, 08/16/2020    Health Maintenance  Topic Date Due  . Zoster Vaccines- Shingrix (1 of 2) Never done  . OPHTHALMOLOGY EXAM  10/20/2021  . HEMOGLOBIN A1C  01/08/2022  . INFLUENZA VACCINE  04/16/2022  . FOOT EXAM  07/10/2022  . URINE MICROALBUMIN  07/10/2022  . COLONOSCOPY (Pts 45-49yrs Insurance coverage will need to be confirmed)  10/28/2022  . MAMMOGRAM  01/26/2023  . TETANUS/TDAP  01/31/2028  . COVID-19 Vaccine  Completed  . Hepatitis C Screening  Completed  . HIV Screening  Completed  . HPV VACCINES  Aged Out    Discussed health benefits of physical activity, and encouraged her to engage in regular exercise appropriate for her age and condition.  Problem List Items Addressed This Visit       Cardiovascular and Mediastinum   Hypertension associated with diabetes (HCC)    Well controlled Continue lifestyle management Recheck metabolic panel F/u in 6 months       Relevant Orders   Comprehensive metabolic panel     Digestive   NASH (nonalcoholic steatohepatitis)    Continue to monitor LFTs      Relevant Orders   Comprehensive metabolic panel     Endocrine   Hyperlipidemia associated with type 2 diabetes mellitus (HCC)    Reviewed last lipid panel Not currently on a statin Recheck FLP and CMP Discussed diet and exercise       Relevant Orders   Comprehensive metabolic panel   Lipid panel   Diabetes (HCC)    Well controlled with last A1c - recheck today Continue current medications UTD on vaccines, eye exam (ROI sent), foot exam uACR today Discussed diet and exercise F/u in 6 months       Relevant Orders   Hemoglobin  A1c   Urine Microalbumin w/creat. ratio   Hypothyroidism    Previously well controlled Continue Synthroid at current dose  Recheck TSH and adjust Synthroid as indicated        Relevant Orders   TSH   Multinodular thyroid    Small stable nodules - do not meet criteria for biopsy or f/u imaging      Relevant Orders   TSH     Other   Obesity    Discussed importance of healthy weight management Discussed diet and exercise       Other Visit Diagnoses     Encounter for annual physical exam    -    Primary   Relevant Orders   Hemoglobin A1c   Comprehensive metabolic panel   Lipid panel   Urine Microalbumin w/creat. ratio   TSH        Return in about 6 months (around 08/27/2022) for chronic disease f/u.     I, Angela Bacigalupo, MD, have reviewed all documentation for this visit. The documentation on 02/25/22 for the exam, diagnosis, procedures, and orders are all accurate and complete.   Bacigalupo, Angela M, MD, MPH [facility]   

## 2022-02-25 ENCOUNTER — Encounter: Payer: Self-pay | Admitting: Family Medicine

## 2022-02-25 ENCOUNTER — Ambulatory Visit: Payer: 59 | Admitting: Family Medicine

## 2022-02-25 ENCOUNTER — Ambulatory Visit (INDEPENDENT_AMBULATORY_CARE_PROVIDER_SITE_OTHER): Payer: BC Managed Care – PPO | Admitting: Family Medicine

## 2022-02-25 VITALS — BP 120/80 | HR 80 | Temp 97.8°F | Resp 16 | Wt 213.2 lb

## 2022-02-25 DIAGNOSIS — E042 Nontoxic multinodular goiter: Secondary | ICD-10-CM

## 2022-02-25 DIAGNOSIS — Z6834 Body mass index (BMI) 34.0-34.9, adult: Secondary | ICD-10-CM

## 2022-02-25 DIAGNOSIS — E785 Hyperlipidemia, unspecified: Secondary | ICD-10-CM

## 2022-02-25 DIAGNOSIS — Z0000 Encounter for general adult medical examination without abnormal findings: Secondary | ICD-10-CM

## 2022-02-25 DIAGNOSIS — E669 Obesity, unspecified: Secondary | ICD-10-CM

## 2022-02-25 DIAGNOSIS — E1159 Type 2 diabetes mellitus with other circulatory complications: Secondary | ICD-10-CM | POA: Diagnosis not present

## 2022-02-25 DIAGNOSIS — K7581 Nonalcoholic steatohepatitis (NASH): Secondary | ICD-10-CM

## 2022-02-25 DIAGNOSIS — I152 Hypertension secondary to endocrine disorders: Secondary | ICD-10-CM

## 2022-02-25 DIAGNOSIS — E1142 Type 2 diabetes mellitus with diabetic polyneuropathy: Secondary | ICD-10-CM

## 2022-02-25 DIAGNOSIS — E039 Hypothyroidism, unspecified: Secondary | ICD-10-CM | POA: Diagnosis not present

## 2022-02-25 DIAGNOSIS — E1169 Type 2 diabetes mellitus with other specified complication: Secondary | ICD-10-CM

## 2022-02-25 NOTE — Assessment & Plan Note (Signed)
Discussed importance of healthy weight management Discussed diet and exercise  

## 2022-02-25 NOTE — Assessment & Plan Note (Signed)
Previously well controlled Continue Synthroid at current dose  Recheck TSH and adjust Synthroid as indicated   

## 2022-02-25 NOTE — Assessment & Plan Note (Signed)
Reviewed last lipid panel Not currently on a statin Recheck FLP and CMP Discussed diet and exercise  

## 2022-02-25 NOTE — Assessment & Plan Note (Signed)
Well controlled Continue lifestyle management Recheck metabolic panel F/u in 6 months

## 2022-02-25 NOTE — Assessment & Plan Note (Signed)
Well controlled with last A1c - recheck today Continue current medications UTD on vaccines, eye exam (ROI sent), foot exam uACR today Discussed diet and exercise F/u in 6 months

## 2022-02-25 NOTE — Assessment & Plan Note (Signed)
Small stable nodules - do not meet criteria for biopsy or f/u imaging

## 2022-02-25 NOTE — Patient Instructions (Signed)
The CDC recommends two doses of Shingrix (the shingles vaccine) separated by 2 to 6 months for adults age 63 years and older. I recommend checking with your insurance plan regarding coverage for this vaccine.

## 2022-02-25 NOTE — Assessment & Plan Note (Signed)
Continue to monitor LFTs

## 2022-02-27 DIAGNOSIS — E785 Hyperlipidemia, unspecified: Secondary | ICD-10-CM | POA: Diagnosis not present

## 2022-02-27 DIAGNOSIS — I152 Hypertension secondary to endocrine disorders: Secondary | ICD-10-CM | POA: Diagnosis not present

## 2022-02-27 DIAGNOSIS — Z0000 Encounter for general adult medical examination without abnormal findings: Secondary | ICD-10-CM | POA: Diagnosis not present

## 2022-02-27 DIAGNOSIS — E1142 Type 2 diabetes mellitus with diabetic polyneuropathy: Secondary | ICD-10-CM | POA: Diagnosis not present

## 2022-02-27 DIAGNOSIS — K7581 Nonalcoholic steatohepatitis (NASH): Secondary | ICD-10-CM | POA: Diagnosis not present

## 2022-02-27 DIAGNOSIS — E1169 Type 2 diabetes mellitus with other specified complication: Secondary | ICD-10-CM | POA: Diagnosis not present

## 2022-02-27 DIAGNOSIS — E1159 Type 2 diabetes mellitus with other circulatory complications: Secondary | ICD-10-CM | POA: Diagnosis not present

## 2022-02-28 LAB — COMPREHENSIVE METABOLIC PANEL
ALT: 19 IU/L (ref 0–32)
AST: 22 IU/L (ref 0–40)
Albumin/Globulin Ratio: 2.4 — ABNORMAL HIGH (ref 1.2–2.2)
Albumin: 4.7 g/dL (ref 3.8–4.8)
Alkaline Phosphatase: 86 IU/L (ref 44–121)
BUN/Creatinine Ratio: 16 (ref 12–28)
BUN: 14 mg/dL (ref 8–27)
Bilirubin Total: 0.4 mg/dL (ref 0.0–1.2)
CO2: 22 mmol/L (ref 20–29)
Calcium: 9.1 mg/dL (ref 8.7–10.3)
Chloride: 103 mmol/L (ref 96–106)
Creatinine, Ser: 0.86 mg/dL (ref 0.57–1.00)
Globulin, Total: 2 g/dL (ref 1.5–4.5)
Glucose: 123 mg/dL — ABNORMAL HIGH (ref 70–99)
Potassium: 4.1 mmol/L (ref 3.5–5.2)
Sodium: 140 mmol/L (ref 134–144)
Total Protein: 6.7 g/dL (ref 6.0–8.5)
eGFR: 76 mL/min/{1.73_m2} (ref >59)

## 2022-02-28 LAB — LIPID PANEL
Chol/HDL Ratio: 4.3 ratio (ref 0.0–4.4)
Cholesterol, Total: 193 mg/dL (ref 100–199)
HDL: 45 mg/dL (ref >39)
LDL Chol Calc (NIH): 119 mg/dL — ABNORMAL HIGH (ref 0–99)
Triglycerides: 166 mg/dL — ABNORMAL HIGH (ref 0–149)
VLDL Cholesterol Cal: 29 mg/dL (ref 5–40)

## 2022-02-28 LAB — HEMOGLOBIN A1C
Est. average glucose Bld gHb Est-mCnc: 123 mg/dL
Hgb A1c MFr Bld: 5.9 % — ABNORMAL HIGH (ref 4.8–5.6)

## 2022-02-28 LAB — MICROALBUMIN / CREATININE URINE RATIO
Creatinine, Urine: 194.5 mg/dL
Microalb/Creat Ratio: 37 mg/g creat — ABNORMAL HIGH (ref 0–29)
Microalbumin, Urine: 71.2 ug/mL

## 2022-02-28 LAB — TSH: TSH: 1.49 u[IU]/mL (ref 0.450–4.500)

## 2022-03-21 ENCOUNTER — Encounter: Payer: 59 | Admitting: Family Medicine

## 2022-03-26 ENCOUNTER — Ambulatory Visit (INDEPENDENT_AMBULATORY_CARE_PROVIDER_SITE_OTHER): Payer: BC Managed Care – PPO | Admitting: Dermatology

## 2022-03-26 DIAGNOSIS — D485 Neoplasm of uncertain behavior of skin: Secondary | ICD-10-CM

## 2022-03-26 DIAGNOSIS — Z86018 Personal history of other benign neoplasm: Secondary | ICD-10-CM

## 2022-03-26 DIAGNOSIS — D2272 Melanocytic nevi of left lower limb, including hip: Secondary | ICD-10-CM

## 2022-03-26 DIAGNOSIS — L578 Other skin changes due to chronic exposure to nonionizing radiation: Secondary | ICD-10-CM

## 2022-03-26 DIAGNOSIS — D2262 Melanocytic nevi of left upper limb, including shoulder: Secondary | ICD-10-CM

## 2022-03-26 DIAGNOSIS — Z1283 Encounter for screening for malignant neoplasm of skin: Secondary | ICD-10-CM

## 2022-03-26 DIAGNOSIS — D2239 Melanocytic nevi of other parts of face: Secondary | ICD-10-CM

## 2022-03-26 DIAGNOSIS — L814 Other melanin hyperpigmentation: Secondary | ICD-10-CM

## 2022-03-26 DIAGNOSIS — D229 Melanocytic nevi, unspecified: Secondary | ICD-10-CM

## 2022-03-26 DIAGNOSIS — D225 Melanocytic nevi of trunk: Secondary | ICD-10-CM

## 2022-03-26 DIAGNOSIS — D1800 Hemangioma unspecified site: Secondary | ICD-10-CM

## 2022-03-26 DIAGNOSIS — L821 Other seborrheic keratosis: Secondary | ICD-10-CM

## 2022-03-26 NOTE — Patient Instructions (Addendum)
Wound Care Instructions  Cleanse wound gently with soap and water once a day then pat dry with clean gauze. Apply a thing coat of Petrolatum (petroleum jelly, "Vaseline") over the wound (unless you have an allergy to this). We recommend that you use a new, sterile tube of Vaseline. Do not pick or remove scabs. Do not remove the yellow or white "healing tissue" from the base of the wound.  Cover the wound with fresh, clean, nonstick gauze and secure with paper tape. You may use Band-Aids in place of gauze and tape if the would is small enough, but would recommend trimming much of the tape off as there is often too much. Sometimes Band-Aids can irritate the skin.  You should call the office for your biopsy report after 1 week if you have not already been contacted.  If you experience any problems, such as abnormal amounts of bleeding, swelling, significant bruising, significant pain, or evidence of infection, please call the office immediately.  FOR ADULT SURGERY PATIENTS: If you need something for pain relief you may take 1 extra strength Tylenol (acetaminophen) AND 2 Ibuprofen (24m each) together every 4 hours as needed for pain. (do not take these if you are allergic to them or if you have a reason you should not take them.) Typically, you may only need pain medication for 1 to 3 days.     Melanoma ABCDEs  Melanoma is the most dangerous type of skin cancer, and is the leading cause of death from skin disease.  You are more likely to develop melanoma if you: Have light-colored skin, light-colored eyes, or red or blond hair Spend a lot of time in the sun Tan regularly, either outdoors or in a tanning bed Have had blistering sunburns, especially during childhood Have a close family member who has had a melanoma Have atypical moles or large birthmarks  Early detection of melanoma is key since treatment is typically straightforward and cure rates are extremely high if we catch it early.   The  first sign of melanoma is often a change in a mole or a new dark spot.  The ABCDE system is a way of remembering the signs of melanoma.  A for asymmetry:  The two halves do not match. B for border:  The edges of the growth are irregular. C for color:  A mixture of colors are present instead of an even brown color. D for diameter:  Melanomas are usually (but not always) greater than 668m- the size of a pencil eraser. E for evolution:  The spot keeps changing in size, shape, and color.  Please check your skin once per month between visits. You can use a small mirror in front and a large mirror behind you to keep an eye on the back side or your body.   If you see any new or changing lesions before your next follow-up, please call to schedule a visit.  Please continue daily skin protection including broad spectrum sunscreen SPF 30+ to sun-exposed areas, reapplying every 2 hours as needed when you're outdoors.   Staying in the shade or wearing long sleeves, sun glasses (UVA+UVB protection) and wide brim hats (4-inch brim around the entire circumference of the hat) are also recommended for sun protection.    Due to recent changes in healthcare laws, you may see results of your pathology and/or laboratory studies on MyChart before the doctors have had a chance to review them. We understand that in some cases there may be  results that are confusing or concerning to you. Please understand that not all results are received at the same time and often the doctors may need to interpret multiple results in order to provide you with the best plan of care or course of treatment. Therefore, we ask that you please give [place] 2 business days to thoroughly review all your results before contacting the office for clarification. Should we see a critical lab result, you will be contacted sooner.   If You Need Anything After Your Visit  If you have any questions or concerns for your doctor, please call our main line at  [phone] and press option 4 to reach your doctor's medical assistant. If no one answers, please leave a voicemail as directed and we will return your call as soon as possible. Messages left after 4 pm will be answered the following business day.   You may also send [place] a message via [place]. We typically respond to MyChart messages within 1-2 business days.  For prescription refills, please ask your pharmacy to contact our office. Our fax number is [phone].  If you have an urgent issue when the clinic is closed that cannot wait until the next business day, you can page your doctor at the number below.    Please note that while we do our best to be available for urgent issues outside of office hours, we are not available 24/7.   If you have an urgent issue and are unable to reach [place], you may choose to seek medical care at your doctor's office, retail clinic, urgent care center, or emergency room.  If you have a medical emergency, please immediately call 911 or go to the emergency department.  Pager Numbers  - Dr. Nehemiah Massed: [phone]  - Dr. Laurence Ferrari: [phone]  - Dr. Nicole Kindred: [phone]  In the event of inclement weather, please call our main line at [phone] for an update on the status of any delays or closures.  Dermatology Medication Tips: Please keep the boxes that topical medications come in in order to help keep track of the instructions about where and how to use these. Pharmacies typically print the medication instructions only on the boxes and not directly on the medication tubes.   If your medication is too expensive, please contact our office at [phone] option 4 or send [place] a message through [place].   We are unable to tell what your co-pay for medications will be in advance as this is different depending on your insurance coverage. However, we may be able to find a substitute medication at lower cost or fill out paperwork to get insurance to cover a needed  medication.   If a prior authorization is required to get your medication covered by your insurance company, please allow [place] 1-2 business days to complete this process.  Drug prices often vary depending on where the prescription is filled and some pharmacies may offer cheaper prices.  The website www.goodrx.com contains coupons for medications through different pharmacies. The prices here do not account for what the cost may be with help from insurance (it may be cheaper with your insurance), but the website can give you the price if you did not use any insurance.  - You can print the associated coupon and take it with your prescription to the pharmacy.  - You may also stop by our office during regular business hours and pick up a GoodRx coupon card.  - If you need your prescription sent electronically to a different  pharmacy, notify our office through [facility] or by phone at [phone] option 4.     Si Usted Necesita Algo Despus de Su Visita  Tambin puede enviarnos un mensaje a travs de Pharmacist, community. Por lo general respondemos a los mensajes de MyChart en el transcurso de 1 a 2 das hbiles.  Para renovar recetas, por favor pida a su farmacia que se ponga en contacto con nuestra oficina. Harland Dingwall de fax es [place] [phone].  Si tiene un asunto urgente cuando la clnica est cerrada y que no puede esperar hasta el siguiente da hbil, puede llamar/localizar a su doctor(a) al nmero que aparece a continuacin.   Por favor, tenga en cuenta que aunque hacemos todo lo posible para estar disponibles para asuntos urgentes fuera del horario de [place], no estamos disponibles las 24 horas del da, los 7 das de la [place].   Si tiene un problema urgente y no puede comunicarse con nosotros, puede optar por buscar atencin mdica  en el consultorio de su doctor(a), en una clnica privada, en un centro de atencin urgente o en una sala de emergencias.  Si tiene Engineering geologist,  por favor llame inmediatamente al 911 o vaya a la sala de emergencias.  Nmeros de bper  - Dr. Nehemiah Massed: [phone]  - Dra. Moye: [phone]  - Dra. Nicole Kindred: [phone]  En caso de inclemencias del [place], por favor llame a Johnsie Kindred principal al [phone] para una actualizacin sobre el [place] de cualquier retraso o cierre.  Consejos para la medicacin en dermatologa: Por favor, guarde las cajas en las que vienen los medicamentos de uso tpico para ayudarle a seguir las instrucciones sobre dnde y cmo usarlos. Las farmacias generalmente imprimen las instrucciones del medicamento slo en las cajas y no directamente en los tubos del [place].   Si su medicamento es muy caro, por favor, pngase en contacto con Zigmund Daniel llamando al [phone] y presione la opcin 4 o envenos un mensaje a travs de Pharmacist, community.   No podemos decirle cul ser su copago por los medicamentos por adelantado ya que esto es diferente dependiendo de la cobertura de su seguro. Sin embargo, es posible que podamos encontrar un medicamento sustituto a Electrical engineer un formulario para que el seguro cubra el medicamento que se considera necesario.   Si se requiere una autorizacin previa para que su compaa de seguros [place] su medicamento, por favor permtanos de 1 a 2 das hbiles para completar este proceso.  Los precios de los medicamentos varan con frecuencia dependiendo del Environmental consultant de dnde se surte la receta y alguna farmacias pueden ofrecer precios ms baratos.  El sitio web www.goodrx.com tiene cupones para medicamentos de Airline pilot. Los precios aqu no tienen en cuenta lo que podra costar con la ayuda del seguro (puede ser ms barato con su seguro), pero el sitio web puede darle el precio si no utiliz Research scientist (physical sciences).  - Puede imprimir el cupn correspondiente y llevarlo con su receta a la farmacia.  - Tambin puede pasar por nuestra oficina durante el horario de atencin  regular y Charity fundraiser una tarjeta de cupones de GoodRx.  - Si necesita que su receta se enve electrnicamente a una farmacia diferente, informe a nuestra oficina a travs de MyChart de [place] o por telfono llamando al [phone] y presione la opcin 4.

## 2022-03-26 NOTE — Progress Notes (Signed)
Follow-Up Visit   Subjective  Veronica Butler is a 61 y.o. female who presents for the following: Annual Exam.  The patient presents for Total-Body Skin Exam (TBSE) for skin cancer screening and mole check.  The patient has spots, moles and lesions to be evaluated, some may be new or changing. She has a history of multiple dysplastic nevi.    The following portions of the chart were reviewed this encounter and updated as appropriate:       Review of Systems:  No other skin or systemic complaints except as noted in HPI or Assessment and Plan.  Objective  Well appearing patient in no apparent distress; mood and affect are within normal limits.  A full examination was performed including scalp, head, eyes, ears, nose, lips, neck, chest, axillae, abdomen, back, buttocks, bilateral upper extremities, bilateral lower extremities, hands, feet, fingers, toes, fingernails, and toenails. All findings within normal limits unless otherwise noted below.  L lat canthus, L lat breast, L foot dorsum, L dorsal hand, L spinal upper back 4 mm Medium dark brown macule slight irregular border. Left lateral breast. Photo compared, no change   3 mm brown macule. Left foot dorsum, photo compared no change   5 mm gray brown macule, slightly waxy. Left dorsal hand. Blue nevus vs SK   5 x 4 mm brown macule, darker center. Left spinal upper back, photo compared, no change    Right Ear Concha 4.0 mm speckled brown macule- pt states present for years no changes, photo compared, no change       Left Top of Shoulder 3.0 mm medium dark brown macule         Assessment & Plan  Skin cancer screening performed today.  Actinic Damage - chronic, secondary to cumulative UV radiation exposure/sun exposure over time - diffuse scaly erythematous macules with underlying dyspigmentation - Recommend daily broad spectrum sunscreen SPF 30+ to sun-exposed areas, reapply every 2 hours as needed.  - Recommend  staying in the shade or wearing long sleeves, sun glasses (UVA+UVB protection) and wide brim hats (4-inch brim around the entire circumference of the hat). - Call for new or changing lesions.  Lentigines - Scattered tan macules - Due to sun exposure - Benign-appering, observe - Recommend daily broad spectrum sunscreen SPF 30+ to sun-exposed areas, reapply every 2 hours as needed. - Call for any changes  Seborrheic Keratoses - Stuck-on, waxy, tan-brown papules and/or plaques  - Benign-appearing - Discussed benign etiology and prognosis. - Observe - Call for any changes  Melanocytic Nevi - Tan-brown and/or pink-flesh-colored symmetric macules and papules - Benign appearing on exam today - Observation - Call clinic for new or changing moles - Recommend daily use of broad spectrum spf 30+ sunscreen to sun-exposed areas.   History of Dysplastic Nevi - No evidence of recurrence today - Recommend regular full body skin exams - Recommend daily broad spectrum sunscreen SPF 30+ to sun-exposed areas, reapply every 2 hours as needed.  - Call if any new or changing lesions are noted between office visits  Hemangiomas - Red papules - Discussed benign nature - Observe - Call for any changes  Nevus L lat canthus, L lat breast, L foot dorsum, L dorsal hand, L spinal upper back  Benign-appearing.  Stable compared to photos from 12/06/2019 and 03/14/2021. Observation.  Call clinic for new or changing moles.  Recommend daily use of broad spectrum spf 30+ sunscreen to sun-exposed areas.   Seborrheic keratosis Right Ear Concha  vs Lentigo - Stable compared to photo.  Benign-appearing.  Observation.  Call clinic for new or changing lesions.  Recommend daily use of broad spectrum spf 30+ sunscreen to sun-exposed areas.    Neoplasm of uncertain behavior of skin Left Top of Shoulder  Epidermal / dermal shaving  Lesion diameter (cm):  0.6 Informed consent: discussed and consent obtained    Patient was prepped and draped in usual sterile fashion: Area prepped with alcohol. Anesthesia: the lesion was anesthetized in a standard fashion   Anesthetic:  1% lidocaine w/ epinephrine 1-100,000 buffered w/ 8.4% NaHCO3 Instrument used: flexible razor blade   Hemostasis achieved with: pressure, aluminum chloride and electrodesiccation   Outcome: patient tolerated procedure well   Post-procedure details: wound care instructions given   Post-procedure details comment:  Ointment and small bandage applied  Specimen 1 - Surgical pathology Differential Diagnosis: Nevus r/o dysplasia Check Margins: Yes 3.0 mm medium dark brown macule   Return in about 1 year (around 03/27/2023) for TBSE.  IJamesetta Orleans, CMA, am acting as scribe for Brendolyn Patty, MD .  Documentation: I have reviewed the above documentation for accuracy and completeness, and I agree with the above.  Brendolyn Patty MD

## 2022-03-28 ENCOUNTER — Telehealth: Payer: Self-pay | Admitting: Family Medicine

## 2022-03-28 NOTE — Telephone Encounter (Signed)
Ryan calling from [employer] [facility] regarding PA Semaglutide,0.25 or 0.5MG/DOS, (OZEMPIC, 0.25 OR 0.5 MG/DOSE,) 2 MG/1.5ML SOPN [[id number]] .  The PA was submitted came back with an error message. Just doing a pulse check to see if everything was ok.  Reference Number- [id number]  CB- [phone]

## 2022-04-01 NOTE — Telephone Encounter (Signed)
PA was approved for one year. Will need to renew after 04/07/22.

## 2022-04-01 NOTE — Telephone Encounter (Signed)
Pt called in returning the office call. She says that she received a voicemail to follow up.   Pt says that she is currently still taking Ozempic .25 every Thursday morning.

## 2022-04-02 ENCOUNTER — Telehealth: Payer: Self-pay

## 2022-04-02 NOTE — Telephone Encounter (Signed)
-----   Message from Alfonso Patten, MD sent at 04/02/2022 10:39 AM EDT ----- Skin , left top of shoulder DYSPLASTIC JUNCTIONAL NEVUS WITH MILD ATYPIA, LIMITED MARGINS FREE --> recheck at annual check with Dr. Nicole Kindred  This is a [place]. On the spectrum from normal mole to melanoma skin cancer, this is in between but it is much closer to a normal mole.  - These typically do not progress to melanoma or cause any trouble.  - People who have a history of atypical moles do have a slightly increased risk of developing melanoma somewhere on the body, so a yearly full body skin exam by a dermatologist is recommended.  - Monthly self skin checks and daily sun protection are also recommended.  - Please call if you notice a dark spot coming back where this biopsy was taken.  - Please also call if you notice any new or changing spots anywhere else on the body before your follow-up visit.  - Please call our office or send [place] a message if you have any questions or concerns about this biopsy result.   MAs please call. Thank you!

## 2022-04-02 NOTE — Telephone Encounter (Signed)
Advised pt of bx results/sh ?

## 2022-06-20 ENCOUNTER — Encounter: Payer: 59 | Admitting: Family Medicine

## 2022-06-20 ENCOUNTER — Other Ambulatory Visit: Payer: Self-pay | Admitting: Family Medicine

## 2022-06-20 DIAGNOSIS — G500 Trigeminal neuralgia: Secondary | ICD-10-CM

## 2022-06-20 MED ORDER — CARBAMAZEPINE ER 100 MG PO TB12: 100.0000 mg | ORAL_TABLET | Freq: Every day | ORAL | 3 refills | Status: DC

## 2022-06-20 NOTE — Telephone Encounter (Signed)
Copied from [place] [phone]. Topic: General - Other >> Jun 20, 2022 11:16 AM Everette C wrote: Reason for CRM: Medication Refill - Medication: carbamazepine (TEGRETOL XR) 100 MG 12 hr tablet [[id number]]   Has the patient contacted their pharmacy? Yes.  The patient was unable to refill the medication online  (Agent: If no, request that the patient contact the pharmacy for the refill. If patient does not wish to contact the pharmacy document the reason why and proceed with request.) (Agent: If yes, when and what did the pharmacy advise?)  Preferred Pharmacy (with phone number or street name): [id number]Phone: [phone]: [phone]: Not open 24 hours   Has the patient been seen for an appointment in the last year OR does the patient have an upcoming appointment? Yes.    Agent: Please be advised that RX refills may take up to 3 business days. We ask that you follow-up with your pharmacy.

## 2022-06-20 NOTE — Telephone Encounter (Signed)
Requested medication (s) are due for refill today - no  Requested medication (s) are on the active medication list -yes  Future visit scheduled -yes  Last refill: 07/27/21 #90 3RF  Notes to clinic: [place] lab protocol, requested too early  Requested Prescriptions  Pending Prescriptions Disp Refills   carbamazepine (TEGRETOL XR) 100 MG 12 hr tablet 90 tablet 3    Sig: Take 1 tablet (100 mg total) by mouth daily.     Neurology:  Anticonvulsants - carbamazepine Failed - 06/20/2022 11:35 AM      Failed - Carbamazepine (serum) in normal range and within 360 days    No results found for: "CBMZ", "LABCARB"       Failed - WBC in normal range and within 360 days    WBC  Date Value Ref Range Status  08/04/2017 9.3 3.8 - 10.8 Thousand/uL Final         Failed - PLT in normal range and within 360 days    Platelets  Date Value Ref Range Status  08/04/2017 251 140 - 400 Thousand/uL Final  01/16/2016 254 150 - 379 x10E3/uL Final         Failed - HGB in normal range and within 360 days    Hemoglobin  Date Value Ref Range Status  08/04/2017 14.2 11.7 - 15.5 g/dL Final  01/16/2016 14.6 11.1 - 15.9 g/dL Final         Failed - HCT in normal range and within 360 days    HCT  Date Value Ref Range Status  08/04/2017 42.0 35.0 - 45.0 % Final   Hematocrit  Date Value Ref Range Status  01/16/2016 43.4 34.0 - 46.6 % Final         Passed - AST in normal range and within 360 days    AST  Date Value Ref Range Status  02/27/2022 22 0 - 40 IU/L Final         Passed - ALT in normal range and within 360 days    ALT  Date Value Ref Range Status  02/27/2022 19 0 - 32 IU/L Final         Passed - Na in normal range and within 360 days    Sodium  Date Value Ref Range Status  02/27/2022 140 134 - 144 mmol/L Final         Passed - Cr in normal range and within 360 days    Creat  Date Value Ref Range Status  08/04/2017 0.71 0.50 - 1.05 mg/dL Final    Comment:    For patients >49 years of  age, the reference limit for Creatinine is approximately 13% higher for people identified as African-American. .    Creatinine, Ser  Date Value Ref Range Status  02/27/2022 0.86 0.57 - 1.00 mg/dL Final   Creatinine, POC  Date Value Ref Range Status  08/03/2018 NA mg/dL Final         Passed - Completed PHQ-2 or PHQ-9 in the last 360 days      Passed - Valid encounter within last 12 months    Recent Outpatient Visits           3 months ago Encounter for annual physical exam   [facility] [place], Dionne Bucy, MD   11 months ago Hypertension associated with diabetes [facility])   [facility], Dionne Bucy, MD   1 year ago Type 2 diabetes mellitus with diabetic polyneuropathy, without long-term current use of insulin ([place])  [facility], Dionne Bucy, MD   1 year ago Type 2 diabetes mellitus with diabetic polyneuropathy, without long-term current use of insulin [facility])   [facility] [place], Dionne Bucy, MD   2 years ago Encounter for annual physical exam   [facility], Dionne Bucy, MD       Future Appointments             In 2 months Bacigalupo, Dionne Bucy, MD [facility], [facility]               Requested Prescriptions  Pending Prescriptions Disp Refills   carbamazepine (TEGRETOL XR) 100 MG 12 hr tablet 90 tablet 3    Sig: Take 1 tablet (100 mg total) by mouth daily.     Neurology:  Anticonvulsants - carbamazepine Failed - 06/20/2022 11:35 AM      Failed - Carbamazepine (serum) in normal range and within 360 days    No results found for: "CBMZ", "LABCARB"       Failed - WBC in normal range and within 360 days    WBC  Date Value Ref Range Status  08/04/2017 9.3 3.8 - 10.8 Thousand/uL Final         Failed - PLT in normal range and within 360 days    Platelets  Date Value Ref Range Status  08/04/2017 251 140 - 400 Thousand/uL Final  01/16/2016 254 150 - 379  x10E3/uL Final         Failed - HGB in normal range and within 360 days    Hemoglobin  Date Value Ref Range Status  08/04/2017 14.2 11.7 - 15.5 g/dL Final  01/16/2016 14.6 11.1 - 15.9 g/dL Final         Failed - HCT in normal range and within 360 days    HCT  Date Value Ref Range Status  08/04/2017 42.0 35.0 - 45.0 % Final   Hematocrit  Date Value Ref Range Status  01/16/2016 43.4 34.0 - 46.6 % Final         Passed - AST in normal range and within 360 days    AST  Date Value Ref Range Status  02/27/2022 22 0 - 40 IU/L Final         Passed - ALT in normal range and within 360 days    ALT  Date Value Ref Range Status  02/27/2022 19 0 - 32 IU/L Final         Passed - Na in normal range and within 360 days    Sodium  Date Value Ref Range Status  02/27/2022 140 134 - 144 mmol/L Final         Passed - Cr in normal range and within 360 days    Creat  Date Value Ref Range Status  08/04/2017 0.71 0.50 - 1.05 mg/dL Final    Comment:    For patients >36 years of age, the reference limit for Creatinine is approximately 13% higher for people identified as African-American. .    Creatinine, Ser  Date Value Ref Range Status  02/27/2022 0.86 0.57 - 1.00 mg/dL Final   Creatinine, POC  Date Value Ref Range Status  08/03/2018 NA mg/dL Final         Passed - Completed PHQ-2 or PHQ-9 in the last 360 days      Passed - Valid encounter within last 12 months    Recent Outpatient Visits  3 months ago Encounter for annual physical exam   [facility], Dionne Bucy, MD   11 months ago Hypertension associated with diabetes [facility])   [facility], Dionne Bucy, MD   1 year ago Type 2 diabetes mellitus with diabetic polyneuropathy, without long-term current use of insulin [facility])   [facility], Dionne Bucy, MD   1 year ago Type 2 diabetes mellitus with diabetic polyneuropathy, without long-term current use  of insulin [facility])   [facility], Dionne Bucy, MD   2 years ago Encounter for annual physical exam   [facility] Bacigalupo, Dionne Bucy, MD       Future Appointments             In 2 months Bacigalupo, Dionne Bucy, MD [facility] [place]

## 2022-07-10 ENCOUNTER — Other Ambulatory Visit: Payer: Self-pay | Admitting: Family Medicine

## 2022-08-01 ENCOUNTER — Other Ambulatory Visit: Payer: Self-pay | Admitting: Family Medicine

## 2022-08-27 NOTE — Progress Notes (Unsigned)
I,Aamiyah Derrick S Diar Berkel,acting as a scribe for Lavon Paganini, MD.,have documented all relevant documentation on the behalf of Lavon Paganini, MD,as directed by  Lavon Paganini, MD while in the presence of Lavon Paganini, MD.     Established patient visit   Patient: Veronica Butler   DOB: Jan 22, 1961   61 y.o. Female  MRN: [id number] Visit Date: 08/29/2022  Today's healthcare provider: Lavon Paganini, MD   No chief complaint on file.  Subjective    HPI  Diabetes Mellitus Type II, follow-up  Lab Results  Component Value Date   HGBA1C 5.9 (H) 02/27/2022   HGBA1C 5.9 (A) 07/10/2021   HGBA1C 7.4 (H) 04/03/2021   Last seen for diabetes 6 months ago.  Management since then includes continuing the same treatment. She reports {excellent/good/fair/poor:19665} compliance with treatment. She {is/is not:21021397} having side effects. {document side effects if present:1}  Home blood sugar records: {diabetes glucometry results:16657}  Episodes of hypoglycemia? {Yes/No:20286} {enter details if yes:1}   --------------------------------------------------------------------------------------------------- Hypertension, follow-up  BP Readings from Last 3 Encounters:  02/25/22 120/80  07/10/21 112/77  04/05/21 (!) 149/98   Wt Readings from Last 3 Encounters:  02/25/22 213 lb 3.2 oz (96.7 kg)  07/10/21 220 lb (99.8 kg)  04/05/21 229 lb 1.6 oz (103.9 kg)     She was last seen for hypertension 6 months ago.  BP at that visit was 120/80. Management since that visit includes no changes.  She reports {excellent/good/fair/poor:19665} compliance with treatment. She {is/is not:9024} having side effects. {document side effects if present:1}  Pertinent labs Lab Results  Component Value Date   CHOL 193 02/27/2022   HDL 45 02/27/2022   LDLCALC 119 (H) 02/27/2022   TRIG 166 (H) 02/27/2022   CHOLHDL 4.3 02/27/2022   Lab Results  Component Value Date   NA 140 02/27/2022   K 4.1  02/27/2022   CREATININE 0.86 02/27/2022   EGFR 76 02/27/2022   GLUCOSE 123 (H) 02/27/2022   TSH 1.490 02/27/2022     The 10-year ASCVD risk score (Arnett DK, et al., 2019) is: 8.2%  ---------------------------------------------------------------------------------------------------  Lipid/Cholesterol, follow-up  Last Lipid Panel: Lab Results  Component Value Date   CHOL 193 02/27/2022   LDLCALC 119 (H) 02/27/2022   HDL 45 02/27/2022   TRIG 166 (H) 02/27/2022    She was last seen for this 6 months ago.  Management since that visit includes no changes.  She reports {excellent/good/fair/poor:19665} compliance with treatment. She {is/is not:9024} having side effects. {document side effects if NGEXBMW:4}  Last metabolic panel Lab Results  Component Value Date   GLUCOSE 123 (H) 02/27/2022   NA 140 02/27/2022   K 4.1 02/27/2022   BUN 14 02/27/2022   CREATININE 0.86 02/27/2022   EGFR 76 02/27/2022   GFRNONAA 91 05/24/2020   CALCIUM 9.1 02/27/2022   AST 22 02/27/2022   ALT 19 02/27/2022   The 10-year ASCVD risk score (Arnett DK, et al., 2019) is: 8.2%  --------------------------------------------------------------------------------------------------- Hypothyroid, follow-up  Lab Results  Component Value Date   TSH 1.490 02/27/2022   TSH 1.700 07/10/2021   TSH 1.300 11/16/2020   FREET4 1.20 07/10/2021    Wt Readings from Last 3 Encounters:  02/25/22 213 lb 3.2 oz (96.7 kg)  07/10/21 220 lb (99.8 kg)  04/05/21 229 lb 1.6 oz (103.9 kg)    She was last seen for hypothyroid 6 months ago.  Management since that visit includes no changes. She reports {excellent/good/fair/poor:19665} compliance with treatment. She {is/is not:21021397} having side effects. {  document side effects if present:1}  -----------------------------------------------------------------------------------------   Medications: Outpatient Medications Prior to Visit  Medication Sig   ALPHA LIPOIC  ACID PO Take by mouth.   Blood Glucose Monitoring Suppl (ONE TOUCH ULTRA SYSTEM KIT) W/DEVICE KIT GLUCOMETER TEST STRIPS - Historical Medication  Check blood sugar daily ---- ULTRA ONE TOUCH  Started 11-Jul-2009 Active Comments: DX: 790.29   carbamazepine (TEGRETOL XR) 100 MG 12 hr tablet Take 1 tablet (100 mg total) by mouth daily.   Cranberry 500 MG CAPS Take 1 capsule by mouth daily.   DIGESTIVE ENZYMES PO Take 1 capsule by mouth daily.   levothyroxine (SYNTHROID) 25 MCG tablet TAKE 1 TABLET BY MOUTH DAILY BEFORE BREAKFAST AND 2 TABLETS BY MOUTH ON THE WEEKENDS   LUTEIN PO Take by mouth.   Magnesium 500 MG CAPS Take 2 tablets by mouth daily.    Misc Natural Products (ADRENAL) 200 MG CAPS Take 1 capsule by mouth 2 (two) times daily.    MULTIPLE VITAMIN PO Take 3 tablets by mouth daily.    Multiple Vitamins-Minerals (ZINC PO) Take by mouth.   OVER THE COUNTER MEDICATION Take 2 tablets by mouth daily in the afternoon. EMMA   OZEMPIC, 0.25 OR 0.5 MG/DOSE, 2 MG/3ML SOPN INJECT 0.25MG UNDER THE SKIN EVERY WEEK   Semaglutide,0.25 or 0.5MG/DOS, (OZEMPIC, 0.25 OR 0.5 MG/DOSE,) 2 MG/1.5ML SOPN Inject 0.25 mg into the skin once a week.   No facility-administered medications prior to visit.    Review of Systems  Constitutional:  Negative for appetite change, chills, diaphoresis, fatigue, fever and unexpected weight change.  Eyes:  Negative for visual disturbance.  Respiratory:  Negative for chest tightness and shortness of breath.   Cardiovascular:  Negative for chest pain, palpitations and leg swelling.  Gastrointestinal:  Negative for abdominal pain, constipation, diarrhea, nausea and vomiting.  Endocrine: Negative for cold intolerance and heat intolerance.  Neurological:  Negative for dizziness, light-headedness and headaches.    {Labs  Heme  Chem  Endocrine  Serology  Results Review (optional):23779}   Objective    LMP 11/05/2000  {Show previous vital signs  (optional):23777}  Physical Exam  ***  No results found for any visits on 08/29/22.  Assessment & Plan     ***  No follow-ups on file.      {provider attestation***:1}   Lavon Paganini, MD  [facility] [phone] (phone) [phone] (fax)  [place]

## 2022-08-29 ENCOUNTER — Ambulatory Visit (INDEPENDENT_AMBULATORY_CARE_PROVIDER_SITE_OTHER): Payer: BC Managed Care – PPO | Admitting: Family Medicine

## 2022-08-29 ENCOUNTER — Encounter: Payer: Self-pay | Admitting: Family Medicine

## 2022-08-29 VITALS — BP 134/89 | HR 75 | Temp 98.0°F | Resp 16 | Ht 66.0 in | Wt 215.3 lb

## 2022-08-29 DIAGNOSIS — E1142 Type 2 diabetes mellitus with diabetic polyneuropathy: Secondary | ICD-10-CM

## 2022-08-29 DIAGNOSIS — R809 Proteinuria, unspecified: Secondary | ICD-10-CM

## 2022-08-29 DIAGNOSIS — I152 Hypertension secondary to endocrine disorders: Secondary | ICD-10-CM

## 2022-08-29 DIAGNOSIS — E1129 Type 2 diabetes mellitus with other diabetic kidney complication: Secondary | ICD-10-CM

## 2022-08-29 DIAGNOSIS — E039 Hypothyroidism, unspecified: Secondary | ICD-10-CM | POA: Diagnosis not present

## 2022-08-29 DIAGNOSIS — E1159 Type 2 diabetes mellitus with other circulatory complications: Secondary | ICD-10-CM

## 2022-08-29 DIAGNOSIS — E1169 Type 2 diabetes mellitus with other specified complication: Secondary | ICD-10-CM

## 2022-08-29 DIAGNOSIS — E785 Hyperlipidemia, unspecified: Secondary | ICD-10-CM

## 2022-08-29 DIAGNOSIS — K5903 Drug induced constipation: Secondary | ICD-10-CM

## 2022-08-29 DIAGNOSIS — Z6834 Body mass index (BMI) 34.0-34.9, adult: Secondary | ICD-10-CM

## 2022-08-29 DIAGNOSIS — E669 Obesity, unspecified: Secondary | ICD-10-CM

## 2022-08-29 LAB — POCT GLYCOSYLATED HEMOGLOBIN (HGB A1C)
Est. average glucose Bld gHb Est-mCnc: 123
Hemoglobin A1C: 5.9 % — AB (ref 4.0–5.6)

## 2022-08-29 NOTE — Assessment & Plan Note (Signed)
Previously well controlled Continue Synthroid at current dose  Recheck TSH and adjust Synthroid as indicated   

## 2022-08-29 NOTE — Assessment & Plan Note (Signed)
Discussed importance of healthy weight management Discussed diet and exercise  

## 2022-08-29 NOTE — Assessment & Plan Note (Signed)
Likely 2/2 ozempic Discussed daily miralax and titrate to 1 soft BM daily Increase fiber intake Recheck TSH

## 2022-08-29 NOTE — Assessment & Plan Note (Signed)
Declines ACE/ARB in the past Will recheck UACR today

## 2022-08-29 NOTE — Assessment & Plan Note (Signed)
Reviewed last lipid panel Not currently on a statin Recheck FLP and CMP Discussed diet and exercise  

## 2022-08-29 NOTE — Assessment & Plan Note (Signed)
Well controlled Continue lifestyle modifications Recheck metabolic panel F/u in 6 months

## 2022-08-29 NOTE — Assessment & Plan Note (Signed)
Well controlled Continue current medications UTD on vaccines, eye exam, foot exam (completed today) No ACE/ARB or statin Discussed diet and exercise F/u in 6 months

## 2022-08-30 DIAGNOSIS — I152 Hypertension secondary to endocrine disorders: Secondary | ICD-10-CM | POA: Diagnosis not present

## 2022-08-30 DIAGNOSIS — E039 Hypothyroidism, unspecified: Secondary | ICD-10-CM | POA: Diagnosis not present

## 2022-08-30 DIAGNOSIS — E785 Hyperlipidemia, unspecified: Secondary | ICD-10-CM | POA: Diagnosis not present

## 2022-08-30 DIAGNOSIS — E1159 Type 2 diabetes mellitus with other circulatory complications: Secondary | ICD-10-CM | POA: Diagnosis not present

## 2022-08-30 DIAGNOSIS — E1169 Type 2 diabetes mellitus with other specified complication: Secondary | ICD-10-CM | POA: Diagnosis not present

## 2022-09-02 ENCOUNTER — Encounter: Payer: Self-pay | Admitting: Family Medicine

## 2022-09-02 LAB — COMPREHENSIVE METABOLIC PANEL
ALT: 24 IU/L (ref 0–32)
AST: 26 IU/L (ref 0–40)
Albumin/Globulin Ratio: 1.9 (ref 1.2–2.2)
Albumin: 4.5 g/dL (ref 3.9–4.9)
Alkaline Phosphatase: 78 IU/L (ref 44–121)
BUN/Creatinine Ratio: 23 (ref 12–28)
BUN: 19 mg/dL (ref 8–27)
Bilirubin Total: 0.4 mg/dL (ref 0.0–1.2)
CO2: 23 mmol/L (ref 20–29)
Calcium: 9.3 mg/dL (ref 8.7–10.3)
Chloride: 103 mmol/L (ref 96–106)
Creatinine, Ser: 0.83 mg/dL (ref 0.57–1.00)
Globulin, Total: 2.4 g/dL (ref 1.5–4.5)
Glucose: 116 mg/dL — ABNORMAL HIGH (ref 70–99)
Potassium: 4.2 mmol/L (ref 3.5–5.2)
Sodium: 141 mmol/L (ref 134–144)
Total Protein: 6.9 g/dL (ref 6.0–8.5)
eGFR: 79 mL/min/{1.73_m2} (ref >59)

## 2022-09-02 LAB — LIPID PANEL WITH LDL/HDL RATIO
Cholesterol, Total: 220 mg/dL — ABNORMAL HIGH (ref 100–199)
HDL: 50 mg/dL (ref >39)
LDL Chol Calc (NIH): 139 mg/dL — ABNORMAL HIGH (ref 0–99)
LDL/HDL Ratio: 2.8 ratio (ref 0.0–3.2)
Triglycerides: 175 mg/dL — ABNORMAL HIGH (ref 0–149)
VLDL Cholesterol Cal: 31 mg/dL (ref 5–40)

## 2022-09-02 LAB — TSH: TSH: 1.62 u[IU]/mL (ref 0.450–4.500)

## 2022-09-02 LAB — MICROALBUMIN / CREATININE URINE RATIO
Creatinine, Urine: 247.8 mg/dL
Microalb/Creat Ratio: 12 mg/g creat (ref 0–29)
Microalbumin, Urine: 29.6 ug/mL

## 2022-11-11 DIAGNOSIS — H25043 Posterior subcapsular polar age-related cataract, bilateral: Secondary | ICD-10-CM | POA: Diagnosis not present

## 2022-11-11 DIAGNOSIS — E119 Type 2 diabetes mellitus without complications: Secondary | ICD-10-CM | POA: Diagnosis not present

## 2022-12-17 ENCOUNTER — Other Ambulatory Visit: Payer: Self-pay | Admitting: Obstetrics and Gynecology

## 2022-12-17 DIAGNOSIS — Z1231 Encounter for screening mammogram for malignant neoplasm of breast: Secondary | ICD-10-CM

## 2022-12-23 ENCOUNTER — Other Ambulatory Visit: Payer: Self-pay

## 2022-12-23 ENCOUNTER — Encounter: Payer: Self-pay | Admitting: Gastroenterology

## 2022-12-23 ENCOUNTER — Ambulatory Visit: Payer: BC Managed Care – PPO | Admitting: Gastroenterology

## 2022-12-23 VITALS — BP 152/102 | HR 88 | Temp 97.9°F | Ht 66.0 in | Wt 217.4 lb

## 2022-12-23 DIAGNOSIS — K219 Gastro-esophageal reflux disease without esophagitis: Secondary | ICD-10-CM | POA: Diagnosis not present

## 2022-12-23 DIAGNOSIS — Z1211 Encounter for screening for malignant neoplasm of colon: Secondary | ICD-10-CM

## 2022-12-23 DIAGNOSIS — R1013 Epigastric pain: Secondary | ICD-10-CM | POA: Diagnosis not present

## 2022-12-23 DIAGNOSIS — K5900 Constipation, unspecified: Secondary | ICD-10-CM | POA: Diagnosis not present

## 2022-12-23 MED ORDER — NA SULFATE-K SULFATE-MG SULF 17.5-3.13-1.6 GM/177ML PO SOLN: 354.0000 mL | Freq: Once | ORAL | 0 refills | Status: AC

## 2022-12-23 NOTE — Progress Notes (Signed)
Arlyss Repress, MD [address]  [address]  [place], [place] [address]  Main: [phone]  Fax: [phone]    Gastroenterology Consultation  Referring Provider:     Erasmo Downer, MD Primary Care Physician:  Erasmo Downer, MD Primary Gastroenterologist:  Dr. Arlyss Repress Reason for Consultation: Chronic GERD, constipation        HPI:   Veronica Butler is a 62 y.o. female referred by Dr. Beryle Flock, Marzella Schlein, MD  for consultation & management of chronic GERD.  Patient has history of metabolic syndrome, started on Ozempic in December 2023.  She developed constipation which was attributed to Ozempic.  She has been managing constipation with MiraLAX and increasing fiber intake.  She is also noticing reflux, regurgitation of food, epigastric discomfort.  Labs are unrevealing.  HbA1c is 5.9  NSAIDs: None  Antiplts/Anticoagulants/Anti thrombotics: None  GI Procedures:  Colonoscopy 10/28/2017 Suboptimal prep in the right colon  Past Medical History:  Diagnosis Date   Cancer 2002   Cervical CA with hysterectomy.   Diabetes 08/22/2015   Dysplastic nevus 11/02/2019   Left spinal mid back. Moderate atypia, peripheral margin involved   Dysplastic nevus 11/02/2019   Right spinal mid back. Severe atypia, peripheral margin involved. Excised 11/29/2019, margins free.   Dysplastic nevus 05/01/2010   Right medial lower calf. Mild to moderate atypia, extends to one edge.   Dysplastic nevus 03/26/2022   Left Top of Shoulder, mild atypia   Heart murmur    Hx of dysplastic nevus 2007   multiple sites    Hx of dysplastic nevus 2007   multiple sites    Hypertension associated with diabetes 04/05/2021   Migraines    PONV (postoperative nausea and vomiting)    Sleep apnea    CPAP, recently stopped using due to 50lb weight loss   Trigeminal neuralgia     Past Surgical History:  Procedure Laterality Date   ABDOMINAL HYSTERECTOMY  2002   still has ovaries    CHOLECYSTECTOMY  1998   COLONOSCOPY WITH PROPOFOL N/A 10/28/2017   Procedure: COLONOSCOPY WITH PROPOFOL;  Surgeon: Pasty Spillers, MD;  [facility];  Service: Endoscopy;  Laterality: N/A;   HERNIA REPAIR     along with gallbladder surgery   navel cyst  1980'   removed   TONSILLECTOMY  at age 51     Current Outpatient Medications:    ALPHA LIPOIC ACID PO, Take by mouth., Disp: , Rfl:    Blood Glucose Monitoring Suppl (ONE TOUCH ULTRA SYSTEM KIT) W/DEVICE KIT, GLUCOMETER TEST STRIPS - Historical Medication  Check blood sugar daily ---- ULTRA ONE TOUCH  Started 11-Jul-2009 Active Comments: DX: 790.29, Disp: , Rfl:    carbamazepine (TEGRETOL XR) 100 MG 12 hr tablet, Take 1 tablet (100 mg total) by mouth daily., Disp: 90 tablet, Rfl: 3   Cranberry 500 MG CAPS, Take 1 capsule by mouth daily., Disp: , Rfl:    DIGESTIVE ENZYMES PO, Take 1 capsule by mouth daily., Disp: , Rfl:    levothyroxine (SYNTHROID) 25 MCG tablet, TAKE 1 TABLET BY MOUTH DAILY BEFORE BREAKFAST AND 2 TABLETS BY MOUTH ON THE WEEKENDS, Disp: 117 tablet, Rfl: 1   LUTEIN PO, Take by mouth., Disp: , Rfl:    Magnesium 500 MG CAPS, Take 2 tablets by mouth daily. , Disp: , Rfl:    Misc Natural Products (ADRENAL) 200 MG CAPS, Take 1 capsule by mouth 2 (two) times daily. , Disp: ,  Rfl:    MULTIPLE VITAMIN PO, Take 3 tablets by mouth daily. , Disp: , Rfl:    Multiple Vitamins-Minerals (ZINC PO), Take by mouth., Disp: , Rfl:    OVER THE COUNTER MEDICATION, Take 2 tablets by mouth daily in the afternoon. EMMA, Disp: , Rfl:    OZEMPIC, 0.25 OR 0.5 MG/DOSE, 2 MG/3ML SOPN, INJECT 0.25MG  UNDER THE SKIN EVERY WEEK, Disp: 12 mL, Rfl: 0   Family History  Problem Relation Age of Onset   Cancer Father    Alcohol abuse Father    Hypertension Mother    Sarcoidosis Sister    Cancer Maternal Grandmother        colon   Dementia Maternal Grandmother      Social History   Tobacco Use   Smoking status: Never   Smokeless  tobacco: Never  Substance Use Topics   Alcohol use: No   Drug use: No    Allergies as of 12/23/2022 - Review Complete 08/29/2022  Allergen Reaction Noted   Cortisone Other (See Comments) 02/03/2014   Tape Swelling and Dermatitis 02/03/2014   Ciprofloxacin Rash 02/03/2014   Floxacillin [floxacillin (flucloxacillin)] Rash 02/03/2014   Levaquin [levofloxacin in d5w] Rash 02/03/2014   Quinolones Rash 09/02/2016    Review of Systems:    All systems reviewed and negative except where noted in HPI.   Physical Exam:  BP (!) 150/90 (BP Location: Left Arm, Patient Position: Sitting, Cuff Size: Normal)   Pulse 92   Temp 97.9 F (36.6 C) (Oral)   Ht 5\' 6"  (1.676 m)   Wt 217 lb 6 oz (98.6 kg)   LMP 11/05/2000   BMI 35.09 kg/m  Patient's last menstrual period was 11/05/2000.  General:   Alert,  Well-developed, well-nourished, pleasant and cooperative in NAD Head:  Normocephalic and atraumatic. Eyes:  Sclera clear, no icterus.   Conjunctiva pink. Ears:  Normal auditory acuity. Nose:  No deformity, discharge, or lesions. Mouth:  No deformity or lesions,oropharynx pink & moist. Neck:  Supple; no masses or thyromegaly. Lungs:  Respirations even and unlabored.  Clear throughout to auscultation.   No wheezes, crackles, or rhonchi. No acute distress. Heart:  Regular rate and rhythm; no murmurs, clicks, rubs, or gallops. Abdomen:  Normal bowel sounds. Soft, non-tender and non-distended without masses, hepatosplenomegaly or hernias noted.  No guarding or rebound tenderness.   Rectal: Not performed Msk:  Symmetrical without gross deformities. Good, equal movement & strength bilaterally. Pulses:  Normal pulses noted. Extremities:  No clubbing or edema.  No cyanosis. Neurologic:  Alert and oriented x3;  grossly normal neurologically. Skin:  Intact without significant lesions or rashes. No jaundice. Psych:  Alert and cooperative. Normal mood and affect.  Imaging Studies: No abdominal  imaging  Assessment and Plan:   Veronica Butler is a 62 y.o. female with metabolic syndrome, diabetes on Ozempic is seen in consultation for symptoms of dyspepsia and GERD, history of constipation  Recommend EGD for further evaluation Recommend colonoscopy for colon cancer screening with a 2-day prep Patient will have to hold Ozempic for 1 week prior to the procedures  Follow up based on the above workup   Arlyss Repress, MD

## 2022-12-27 ENCOUNTER — Telehealth: Payer: Self-pay

## 2022-12-27 NOTE — Telephone Encounter (Signed)
Pt left message to change procedures from 01/20/2023 would like to be Rescheduled till the end of may if possible husband has jury duty on 01/20/2023 he's her only transportation please return call

## 2022-12-27 NOTE — Telephone Encounter (Signed)
Patient call returned.  Colonoscopy reschedule to 02/21/23.  Vickie in Endo notified of date change.  Pt has prep already. Referral updated. New instructions sent.  Thanks, [place], [place]

## 2023-01-03 ENCOUNTER — Ambulatory Visit: Payer: Self-pay

## 2023-01-03 ENCOUNTER — Other Ambulatory Visit: Payer: Self-pay | Admitting: Family Medicine

## 2023-01-03 ENCOUNTER — Telehealth: Payer: Self-pay | Admitting: Family Medicine

## 2023-01-03 DIAGNOSIS — B3731 Acute candidiasis of vulva and vagina: Secondary | ICD-10-CM

## 2023-01-03 MED ORDER — FLUCONAZOLE 150 MG PO TABS: 150.0000 mg | ORAL_TABLET | Freq: Once | ORAL | 0 refills | Status: AC

## 2023-01-03 NOTE — Telephone Encounter (Signed)
Patient advised. Verbalized understanding

## 2023-01-03 NOTE — Telephone Encounter (Signed)
Patient called stated itching and burning which started 2 days ago. She thinks she has a yeast infection but has not tried over the counter. She is requesting a Diflucan pill be called in for her   Call can not be completed.

## 2023-01-03 NOTE — Telephone Encounter (Signed)
Rx for Diflucan sent to patient's pharmacy   Ronnald Ramp, MD  [facility]

## 2023-01-03 NOTE — Telephone Encounter (Signed)
Summary: possible yeast infection   Patient called stated itching and burning which started 2 days ago. She thinks she has a yeast infection but has not tried over the counter. She is requesting a Diflucan pill be called in for her     Call cannot be completed as dialed - no other number available. Forwarding encounter to practice for follow up.

## 2023-01-03 NOTE — Telephone Encounter (Signed)
Summary: possible yeast infection   Patient called stated itching and burning which started 2 days ago. She thinks she has a yeast infection but has not tried over the counter. She is requesting a Diflucan pill be called in for her      Call cannot be completed as dialed - tried 2 times.

## 2023-01-07 DIAGNOSIS — Z01419 Encounter for gynecological examination (general) (routine) without abnormal findings: Secondary | ICD-10-CM | POA: Diagnosis not present

## 2023-01-07 DIAGNOSIS — Z1231 Encounter for screening mammogram for malignant neoplasm of breast: Secondary | ICD-10-CM | POA: Diagnosis not present

## 2023-01-07 DIAGNOSIS — Z1331 Encounter for screening for depression: Secondary | ICD-10-CM | POA: Diagnosis not present

## 2023-01-09 ENCOUNTER — Other Ambulatory Visit: Payer: Self-pay | Admitting: Family Medicine

## 2023-01-27 ENCOUNTER — Ambulatory Visit
Admission: RE | Admit: 2023-01-27 | Discharge: 2023-01-27 | Disposition: A | Discharge status: Home / Self Care | Payer: BC Managed Care – PPO | Source: Ambulatory Visit | Attending: Obstetrics and Gynecology | Admitting: Obstetrics and Gynecology

## 2023-01-27 DIAGNOSIS — Z1231 Encounter for screening mammogram for malignant neoplasm of breast: Secondary | ICD-10-CM | POA: Insufficient documentation

## 2023-01-31 ENCOUNTER — Other Ambulatory Visit: Payer: Self-pay | Admitting: Obstetrics and Gynecology

## 2023-01-31 DIAGNOSIS — R928 Other abnormal and inconclusive findings on diagnostic imaging of breast: Secondary | ICD-10-CM

## 2023-02-03 ENCOUNTER — Other Ambulatory Visit: Payer: Self-pay | Admitting: Family Medicine

## 2023-02-03 ENCOUNTER — Ambulatory Visit: Payer: BC Managed Care – PPO | Attending: Family Medicine

## 2023-02-03 ENCOUNTER — Ambulatory Visit: Payer: BC Managed Care – PPO | Admitting: Family Medicine

## 2023-02-03 VITALS — BP 123/72 | HR 88 | Ht 66.0 in | Wt 219.6 lb

## 2023-02-03 DIAGNOSIS — G43009 Migraine without aura, not intractable, without status migrainosus: Secondary | ICD-10-CM | POA: Diagnosis not present

## 2023-02-03 DIAGNOSIS — E039 Hypothyroidism, unspecified: Secondary | ICD-10-CM

## 2023-02-03 DIAGNOSIS — G500 Trigeminal neuralgia: Secondary | ICD-10-CM | POA: Diagnosis not present

## 2023-02-03 DIAGNOSIS — R002 Palpitations: Secondary | ICD-10-CM | POA: Diagnosis not present

## 2023-02-03 DIAGNOSIS — R079 Chest pain, unspecified: Secondary | ICD-10-CM

## 2023-02-03 LAB — HM DIABETES EYE EXAM

## 2023-02-03 NOTE — Progress Notes (Unsigned)
Acute Office Visit  Subjective:     Patient ID: Veronica Butler, female    DOB: 02-02-1961, 62 y.o.   MRN: [id number]  Chief Complaint  Patient presents with   Headache   Cough    HPI Palpitations and cough Patient is in today for racing heart and dry cough since Friday.   Right sided headache four weeks ago, intense pain that tracked from the temple to behind her eyes and to her jaw. Pain was different in quality from her previous left sided trigeminal neuralgia. Pain did not improve after taking carbamazepine. Started taking Excedrin to manage the pain. Headache began to resolve on Thursday, she stopped taking Excedrin on that day. Friday she started noticing palpitations, chest pain, and cough that was worse in the morning. Pain is not exacerbated with physical activity. She has not had any chest pain today and the headache is mild today. She does not drink coffee or use any products with caffeine.   Mother has had similar chest pain and cough. Was diagnosed with silent reflux.   Patient has an endoscopy scheduled for June 7th for potential esophagitis or GERD.  Review of Systems  Constitutional:  Negative for chills and fever.  HENT:  Negative for ear discharge, ear pain, hearing loss and tinnitus.   Eyes:  Positive for blurred vision.  Respiratory:  Positive for cough and shortness of breath. Negative for sputum production and wheezing.   Cardiovascular:  Positive for chest pain and palpitations. Negative for leg swelling.  Gastrointestinal:  Negative for abdominal pain, heartburn, nausea and vomiting.  Neurological:  Positive for headaches. Negative for dizziness, sensory change and loss of consciousness.        Objective:    BP 123/72 Comment: home reading  Pulse 88   Ht 5\' 6"  (1.676 m)   Wt 219 lb 9.6 oz (99.6 kg)   LMP 11/05/2000   BMI 35.44 kg/m  {Vitals History (Optional):23777}  Physical Exam Constitutional:      General: She is not in acute  distress. HENT:     Head: Normocephalic and atraumatic.  Eyes:     Extraocular Movements: Extraocular movements intact.     Conjunctiva/sclera: Conjunctivae normal.     Pupils: Pupils are equal, round, and reactive to light.  Cardiovascular:     Pulses: Normal pulses.     Heart sounds: Murmur heard.  Pulmonary:     Effort: Pulmonary effort is normal.     Breath sounds: Normal breath sounds.  Musculoskeletal:     Cervical back: Normal range of motion and neck supple.  Skin:    General: Skin is warm and dry.     Capillary Refill: Capillary refill takes less than 2 seconds.  Neurological:     Mental Status: She is alert.    EKG  EKG showed regular rate, sinus rhythm with narrow QRS complexes, no QT elongation, upright T waves, no ST elevation, and left axis deviation.    No results found for any visits on 02/03/23.      Assessment & Plan:   Problem List Items Addressed This Visit     Headache, migraine    Headache possibly due to migraine, giant cell arteritis, or trigeminal neuralgia.  Unilateral location along with improvement after taking Excedrin and gradual resolution of headache most c/w migraine.  Location of headache raises concern for GCA and trigeminal neuralgia. Ordered ESR.  If symptoms return or worsen consider referral to neurology.  Relevant Orders   Sed Rate (ESR)   Hypothyroidism    Previously well controlled. Patient recently experiencing palpitations. Recheck TSH and adjust synthroid as needed.      Relevant Orders   TSH   Trigeminal neuralgia    Previously well controlled. Continue tegretol.  See plan under headache, migraine.  Ordered ESR to narrow differential between migraine, GCA, and trigeminal neuralgia.       Relevant Orders   Sed Rate (ESR)   Other Visit Diagnoses     Palpitations    -  Primary Acute and improving.  Possibly due to caffeine withdrawal, arrhythmia, drug induced hyperthyroidism, or electrolyte  abnormalities. Given timing, gradual improvement over the past few days, and history of recent Excedrin use with out other regular caffeine use, most c/w caffeine withdrawal.    No arrhythmia on EKG in office. Ordered home cardiac monitoring to rule out arrhythmia.  Ordered EKG, TSH, CBC w/diff.    Relevant Orders   Comprehensive Metabolic Panel (CMET)   TSH   EKG 12-Lead   CBC w/Diff/Platelet   LONG TERM MONITOR (3-14 DAYS)   Chest pain, unspecified type    Timing, symptom description, gradual improvement over the past few days, physical exam, and EKG results most c/w caffeine withdrawal.  This does not exclude GERD or cardiac causes.  Will review results of endoscopy. Ordered home long term cardiac monitoring.      Relevant Orders   EKG 12-Lead       No orders of the defined types were placed in this encounter.   Return if symptoms worsen or fail to improve.  Gilmer Mor, Medical Student

## 2023-02-03 NOTE — Assessment & Plan Note (Addendum)
Previously well controlled. Continue tegretol.  See plan under headache, migraine.  Ordered ESR to help narrow acute headache cause between migraine, GCA, and trigeminal neuralgia.

## 2023-02-03 NOTE — Assessment & Plan Note (Addendum)
Previously well controlled. Patient recently experiencing palpitations. Recheck TSH and adjust synthroid as needed.

## 2023-02-03 NOTE — Assessment & Plan Note (Addendum)
Headache possibly due to migraine, giant cell arteritis, or trigeminal neuralgia.  Unilateral location along with improvement after taking Excedrin and gradual resolution of headache most c/w migraine.  Location of headache raises concern for GCA and trigeminal neuralgia. Ordered ESR.  If symptoms return or worsen consider referral to neurology.

## 2023-02-04 ENCOUNTER — Encounter: Payer: Self-pay | Admitting: Family Medicine

## 2023-02-04 LAB — CBC WITH DIFFERENTIAL/PLATELET
Basophils Absolute: 0.1 10*3/uL (ref 0.0–0.2)
Basos: 1 %
EOS (ABSOLUTE): 0.2 10*3/uL (ref 0.0–0.4)
Eos: 2 %
Hematocrit: 43.5 % (ref 34.0–46.6)
Hemoglobin: 15 g/dL (ref 11.1–15.9)
Immature Grans (Abs): 0 10*3/uL (ref 0.0–0.1)
Immature Granulocytes: 0 %
Lymphocytes Absolute: 3.5 10*3/uL — ABNORMAL HIGH (ref 0.7–3.1)
Lymphs: 35 %
MCH: 30.6 pg (ref 26.6–33.0)
MCHC: 34.5 g/dL (ref 31.5–35.7)
MCV: 89 fL (ref 79–97)
Monocytes Absolute: 0.7 10*3/uL (ref 0.1–0.9)
Monocytes: 7 %
Neutrophils Absolute: 5.6 10*3/uL (ref 1.4–7.0)
Neutrophils: 55 %
Platelets: 266 10*3/uL (ref 150–450)
RBC: 4.9 x10E6/uL (ref 3.77–5.28)
RDW: 12.1 % (ref 11.7–15.4)
WBC: 10.1 10*3/uL (ref 3.4–10.8)

## 2023-02-04 LAB — COMPREHENSIVE METABOLIC PANEL
ALT: 20 IU/L (ref 0–32)
AST: 24 IU/L (ref 0–40)
Albumin/Globulin Ratio: 2.2 (ref 1.2–2.2)
Albumin: 4.7 g/dL (ref 3.9–4.9)
Alkaline Phosphatase: 91 IU/L (ref 44–121)
BUN/Creatinine Ratio: 21 (ref 12–28)
BUN: 15 mg/dL (ref 8–27)
Bilirubin Total: 0.3 mg/dL (ref 0.0–1.2)
CO2: 23 mmol/L (ref 20–29)
Calcium: 9.4 mg/dL (ref 8.7–10.3)
Chloride: 103 mmol/L (ref 96–106)
Creatinine, Ser: 0.72 mg/dL (ref 0.57–1.00)
Globulin, Total: 2.1 g/dL (ref 1.5–4.5)
Glucose: 93 mg/dL (ref 70–99)
Potassium: 4.7 mmol/L (ref 3.5–5.2)
Sodium: 142 mmol/L (ref 134–144)
Total Protein: 6.8 g/dL (ref 6.0–8.5)
eGFR: 94 mL/min/{1.73_m2} (ref >59)

## 2023-02-04 LAB — SEDIMENTATION RATE: Sed Rate: 3 mm/hr (ref 0–40)

## 2023-02-04 LAB — TSH: TSH: 0.965 u[IU]/mL (ref 0.450–4.500)

## 2023-02-06 ENCOUNTER — Encounter: Payer: Self-pay | Admitting: Family Medicine

## 2023-02-07 ENCOUNTER — Ambulatory Visit
Admission: RE | Admit: 2023-02-07 | Discharge: 2023-02-07 | Disposition: A | Discharge status: Home / Self Care | Payer: BC Managed Care – PPO | Source: Ambulatory Visit | Attending: Obstetrics and Gynecology | Admitting: Obstetrics and Gynecology

## 2023-02-07 DIAGNOSIS — R928 Other abnormal and inconclusive findings on diagnostic imaging of breast: Secondary | ICD-10-CM | POA: Insufficient documentation

## 2023-02-07 DIAGNOSIS — R922 Inconclusive mammogram: Secondary | ICD-10-CM | POA: Diagnosis not present

## 2023-02-07 DIAGNOSIS — R002 Palpitations: Secondary | ICD-10-CM

## 2023-02-18 ENCOUNTER — Encounter: Payer: Self-pay | Admitting: Family Medicine

## 2023-02-18 DIAGNOSIS — G500 Trigeminal neuralgia: Secondary | ICD-10-CM

## 2023-02-18 DIAGNOSIS — G43009 Migraine without aura, not intractable, without status migrainosus: Secondary | ICD-10-CM

## 2023-02-19 MED ORDER — PENTAFLUOROPROP-TETRAFLUOROETH EX AERO
INHALATION_SPRAY | CUTANEOUS | Status: AC
  Filled 2023-02-19: qty 30

## 2023-02-20 ENCOUNTER — Telehealth: Payer: Self-pay

## 2023-02-20 ENCOUNTER — Encounter: Payer: Self-pay | Admitting: Gastroenterology

## 2023-02-20 MED ORDER — ONDANSETRON HCL 4 MG PO TABS: 4.0000 mg | ORAL_TABLET | Freq: Three times a day (TID) | ORAL | 0 refills | Status: DC | PRN

## 2023-02-20 MED ORDER — GABAPENTIN 300 MG PO CAPS: 300.0000 mg | ORAL_CAPSULE | Freq: Every day | ORAL | 3 refills | Status: DC

## 2023-02-20 MED ORDER — SUTAB 1479-225-188 MG PO TABS: 12.0000 | ORAL_TABLET | Freq: Once | ORAL | 0 refills | Status: AC

## 2023-02-20 NOTE — Telephone Encounter (Signed)
Pt scheduled for her procedure tomorrow. Yesterday, she drank the miralax prep and ended up throwing it up. She is due to do the regular prep later today and is worried she will not tolerate it. She is request a call back to discuss possibly taking the pills.

## 2023-02-20 NOTE — Telephone Encounter (Signed)
At 5pm:  Take 12 tablets with 16oz of water within 20-30minutes. An hour later drink another 16oz of water in 30 minutes. 30 minutes after that drink the last 16oz of water.  5hours before:  Take 12 tablets with 16oz of water within 20-30minutes. An hour later drink another 16oz of water in 30 minutes. 30 minutes after that drink the last 16oz of water.    Patient states last night she threw up some of the Miralax. She states she thinks it did work because she was going to the bathroom till Midnight last night. She states drinking that much was to much for her because the Ozempic slows down her digestive system and she thinks that is the problem. She said she didn't have any zofran so called in Zofran. Explained the Sutab instructions to patient twice and she would like to try this prep. Sent prep and zofran to the pharmacy

## 2023-02-21 ENCOUNTER — Ambulatory Visit: Payer: BC Managed Care – PPO

## 2023-02-21 ENCOUNTER — Ambulatory Visit
Admission: RE | Admit: 2023-02-21 | Discharge: 2023-02-21 | Disposition: A | Discharge status: Home / Self Care | Payer: BC Managed Care – PPO | Attending: Gastroenterology | Admitting: Gastroenterology

## 2023-02-21 ENCOUNTER — Other Ambulatory Visit: Payer: Self-pay

## 2023-02-21 ENCOUNTER — Encounter: Payer: Self-pay | Admitting: Gastroenterology

## 2023-02-21 ENCOUNTER — Encounter
Admission: RE | Disposition: A | Discharge status: Home / Self Care | Payer: Self-pay | Source: Home / Self Care | Attending: Gastroenterology

## 2023-02-21 DIAGNOSIS — Z800 Family history of malignant neoplasm of digestive organs: Secondary | ICD-10-CM | POA: Diagnosis not present

## 2023-02-21 DIAGNOSIS — D123 Benign neoplasm of transverse colon: Secondary | ICD-10-CM | POA: Insufficient documentation

## 2023-02-21 DIAGNOSIS — K319 Disease of stomach and duodenum, unspecified: Secondary | ICD-10-CM

## 2023-02-21 DIAGNOSIS — Z1211 Encounter for screening for malignant neoplasm of colon: Secondary | ICD-10-CM

## 2023-02-21 DIAGNOSIS — K30 Functional dyspepsia: Secondary | ICD-10-CM | POA: Diagnosis not present

## 2023-02-21 DIAGNOSIS — K219 Gastro-esophageal reflux disease without esophagitis: Secondary | ICD-10-CM | POA: Diagnosis not present

## 2023-02-21 DIAGNOSIS — K635 Polyp of colon: Secondary | ICD-10-CM | POA: Diagnosis not present

## 2023-02-21 DIAGNOSIS — K3189 Other diseases of stomach and duodenum: Secondary | ICD-10-CM | POA: Diagnosis not present

## 2023-02-21 DIAGNOSIS — D122 Benign neoplasm of ascending colon: Secondary | ICD-10-CM | POA: Insufficient documentation

## 2023-02-21 HISTORY — PX: COLONOSCOPY WITH PROPOFOL: SHX5780

## 2023-02-21 HISTORY — PX: ESOPHAGOGASTRODUODENOSCOPY (EGD) WITH PROPOFOL: SHX5813

## 2023-02-21 LAB — GLUCOSE, CAPILLARY: Glucose-Capillary: 114 mg/dL — ABNORMAL HIGH (ref 70–99)

## 2023-02-21 SURGERY — COLONOSCOPY WITH PROPOFOL
Anesthesia: General

## 2023-02-21 MED ORDER — SODIUM CHLORIDE 0.9 % IV SOLN: INTRAVENOUS | Status: DC

## 2023-02-21 MED ORDER — LIDOCAINE HCL (PF) 2 % IJ SOLN
INTRAMUSCULAR | Status: AC
  Filled 2023-02-21: qty 5

## 2023-02-21 MED ORDER — PROPOFOL 10 MG/ML IV BOLUS
INTRAVENOUS | Status: DC | PRN
  Administered 2023-02-21 (×2): 50 mg via INTRAVENOUS
  Administered 2023-02-21: 150 mg via INTRAVENOUS
  Administered 2023-02-21 (×4): 50 mg via INTRAVENOUS

## 2023-02-21 MED ORDER — PROPOFOL 1000 MG/100ML IV EMUL
INTRAVENOUS | Status: AC
  Filled 2023-02-21: qty 100

## 2023-02-21 MED ORDER — LIDOCAINE HCL (CARDIAC) PF 100 MG/5ML IV SOSY
PREFILLED_SYRINGE | INTRAVENOUS | Status: DC | PRN
  Administered 2023-02-21: 100 mg via INTRAVENOUS

## 2023-02-21 NOTE — Op Note (Signed)
[facility] Gastroenterology Patient Name: Veronica Butler Procedure Date: 02/21/2023 8:03 AM MRN: [id number] Account #: [id number] Date of Birth: November 16, 1960 Admit Type: Outpatient Age: 62 Room: [facility] ENDO ROOM 2 Gender: Female Note Status: Finalized Instrument Name: Upper Endoscope [id number] Procedure:             Upper GI endoscopy Indications:           Indigestion Providers:             Toney Reil MD, MD Medicines:             General Anesthesia Complications:         No immediate complications. Estimated blood loss: None. Procedure:             Pre-Anesthesia Assessment:                        - Prior to the procedure, a History and Physical was                         performed, and patient medications and allergies were                         reviewed. The patient is competent. The risks and                         benefits of the procedure and the sedation options and                         risks were discussed with the patient. All questions                         were answered and informed consent was obtained.                         Patient identification and proposed procedure were                         verified by the physician, the nurse, the                         anesthesiologist, the anesthetist and the technician                         in the pre-procedure area in the procedure room in the                         endoscopy suite. Mental Status Examination: alert and                         oriented. Airway Examination: normal oropharyngeal                         airway and neck mobility. Respiratory Examination:                         clear to auscultation. CV Examination: normal.                         Prophylactic Antibiotics: The patient  does not require                         prophylactic antibiotics. Prior Anticoagulants: The                         patient has taken no anticoagulant or antiplatelet                          agents. ASA Grade Assessment: II - A patient with mild                         systemic disease. After reviewing the risks and                         benefits, the patient was deemed in satisfactory                         condition to undergo the procedure. The anesthesia                         plan was to use general anesthesia. Immediately prior                         to administration of medications, the patient was                         re-assessed for adequacy to receive sedatives. The                         heart rate, respiratory rate, oxygen saturations,                         blood pressure, adequacy of pulmonary ventilation, and                         response to care were monitored throughout the                         procedure. The physical status of the patient was                         re-assessed after the procedure.                        After obtaining informed consent, the endoscope was                         passed under direct vision. Throughout the procedure,                         the patient's blood pressure, pulse, and oxygen                         saturations were monitored continuously. The Endoscope                         was introduced through the mouth, and advanced to the  second part of duodenum. The upper GI endoscopy was                         accomplished without difficulty. The patient tolerated                         the procedure well. Findings:      The duodenal bulb and second portion of the duodenum were normal.      Striped moderately erythematous mucosa without bleeding was found in the       gastric antrum. Biopsies were taken with a cold forceps for Helicobacter       pylori testing.      The cardia and gastric fundus were normal on retroflexion.      The incisura and gastric antrum were normal. Biopsies were taken with a       cold forceps for histology.      The gastroesophageal junction and examined  esophagus were normal. Impression:            - Normal duodenal bulb and second portion of the                         duodenum.                        - Erythematous mucosa in the antrum. Biopsied.                        - Normal incisura and antrum. Biopsied.                        - Normal gastroesophageal junction and esophagus. Recommendation:        - Await pathology results.                        - Discharge patient to home (with escort).                        - Resume previous diet today.                        - Proceed with colonoscopy as scheduled                        See colonoscopy report Procedure Code(s):     --- Professional ---                        [phone], Esophagogastroduodenoscopy, flexible,                         transoral; with biopsy, single or multiple Diagnosis Code(s):     --- Professional ---                        K31.89, Other diseases of stomach and duodenum                        K30, Functional dyspepsia CPT copyright 2022 American Medical Association. All rights reserved. The codes documented in this report are preliminary and upon coder review may  be revised to meet current compliance requirements. Dr. Libby Maw  Toney Reil MD, MD 02/21/2023 8:17:15 AM This report has been signed electronically. Number of Addenda: 0 Note Initiated On: 02/21/2023 8:03 AM Estimated Blood Loss:  Estimated blood loss: none.      [facility]

## 2023-02-21 NOTE — Anesthesia Preprocedure Evaluation (Addendum)
Anesthesia Evaluation  Patient identified by MRN, date of birth, ID band Patient awake    Reviewed: Allergy & Precautions, NPO status , Patient's Chart, lab work & pertinent test results  History of Anesthesia Complications (+) PONV and history of anesthetic complications  Airway Mallampati: I   Neck ROM: Full    Dental no notable dental hx.    Pulmonary sleep apnea    Pulmonary exam normal breath sounds clear to auscultation       Cardiovascular hypertension, Normal cardiovascular exam Rhythm:Regular Rate:Normal  ECG 02/03/23: NSR, LAD   Neuro/Psych  Headaches  Neuromuscular disease (trigeminal neuralgia)    GI/Hepatic ,GERD  ,,  Endo/Other  diabetes, Type 2Hypothyroidism  Obesity   Renal/GU      Musculoskeletal   Abdominal   Peds  Hematology negative hematology ROS (+)   Anesthesia Other Findings   Reproductive/Obstetrics Cervical CA s/p hysterectomy                             Anesthesia Physical Anesthesia Plan  ASA: 2  Anesthesia Plan: General   Post-op Pain Management:    Induction: Intravenous  PONV Risk Score and Plan: 4 or greater and Propofol infusion, TIVA and Treatment may vary due to age or medical condition  Airway Management Planned: Natural Airway  Additional Equipment:   Intra-op Plan:   Post-operative Plan:   Informed Consent: I have reviewed the patients History and Physical, chart, labs and discussed the procedure including the risks, benefits and alternatives for the proposed anesthesia with the patient or authorized representative who has indicated his/her understanding and acceptance.       Plan Discussed with: CRNA  Anesthesia Plan Comments: (LMA/GETA backup discussed.  Patient consented for risks of anesthesia including but not limited to:  - adverse reactions to medications - damage to eyes, teeth, lips or other oral mucosa - nerve damage due  to positioning  - sore throat or hoarseness - damage to heart, brain, nerves, lungs, other parts of body or loss of life  Informed patient about role of CRNA in peri- and intra-operative care.  Patient voiced understanding.)        Anesthesia Quick Evaluation

## 2023-02-21 NOTE — Transfer of Care (Signed)
Immediate Anesthesia Transfer of Care Note  Patient: Veronica Butler  Procedure(s) Performed: COLONOSCOPY WITH PROPOFOL ESOPHAGOGASTRODUODENOSCOPY (EGD) WITH PROPOFOL  Patient Location: PACU  Anesthesia Type:General  Level of Consciousness: awake and drowsy  Airway & Oxygen Therapy: Patient Spontanous Breathing  Post-op Assessment: Report given to RN and Post -op Vital signs reviewed and stable  Post vital signs: Reviewed and stable  Last Vitals:  Vitals Value Taken Time  BP 82/57 02/21/23 0843  Temp 36.1 C 02/21/23 0843  Pulse 74 02/21/23 0846  Resp 14 02/21/23 0846  SpO2 100 % 02/21/23 0846  Vitals shown include unvalidated device data.  Last Pain:  Vitals:   02/21/23 0843  TempSrc: Temporal  PainSc: Asleep         Complications: There were no known notable events for this encounter.

## 2023-02-21 NOTE — H&P (Signed)
Arlyss Repress, MD [address]  [address]  [place], [place] [address]  Main: [phone]  Fax: [phone] Pager: [phone]  Primary Care Physician:  Erasmo Downer, MD Primary Gastroenterologist:  Dr. Arlyss Repress  Pre-Procedure History & Physical: HPI:  Veronica Butler is a 62 y.o. female is here for an endoscopy and colonoscopy.   Past Medical History:  Diagnosis Date   Cancer [facility]) 2002   Cervical CA with hysterectomy.   Diabetes (HCC) 08/22/2015   Dysplastic nevus 11/02/2019   Left spinal mid back. Moderate atypia, peripheral margin involved   Dysplastic nevus 11/02/2019   Right spinal mid back. Severe atypia, peripheral margin involved. Excised 11/29/2019, margins free.   Dysplastic nevus 05/01/2010   Right medial lower calf. Mild to moderate atypia, extends to one edge.   Dysplastic nevus 03/26/2022   Left Top of Shoulder, mild atypia   Heart murmur    Hx of dysplastic nevus 2007   multiple sites    Hx of dysplastic nevus 2007   multiple sites    Hypertension associated with diabetes (HCC) 04/05/2021   Migraines    PONV (postoperative nausea and vomiting)    Sleep apnea    CPAP, recently stopped using due to 50lb weight loss   Trigeminal neuralgia     Past Surgical History:  Procedure Laterality Date   ABDOMINAL HYSTERECTOMY  2002   still has ovaries   CHOLECYSTECTOMY  1998   COLONOSCOPY WITH PROPOFOL N/A 10/28/2017   Procedure: COLONOSCOPY WITH PROPOFOL;  Surgeon: Pasty Spillers, MD;  Location: [facility];  Service: Endoscopy;  Laterality: N/A;   HERNIA REPAIR     along with gallbladder surgery   navel cyst  1980'   removed   TONSILLECTOMY  at age 36    Prior to Admission medications   Medication Sig Start Date End Date Taking? Authorizing Provider  carbamazepine (TEGRETOL XR) 100 MG 12 hr tablet Take 1 tablet (100 mg total) by mouth daily. 06/20/22  Yes Bacigalupo, Marzella Schlein, MD  levothyroxine (SYNTHROID) 25 MCG tablet  TAKE 1 TABLET BY MOUTH DAILY BEFORE BREAKFAST AND 2 TABLETS BY MOUTH ON THE WEEKENDS 01/09/23  Yes Bacigalupo, Marzella Schlein, MD  Magnesium 500 MG CAPS Take 2 tablets by mouth daily.  10/12/09  Yes [provider]  Misc Natural Products (ADRENAL) 200 MG CAPS Take 1 capsule by mouth 2 (two) times daily.  10/12/09  Yes [provider]  Multiple Vitamins-Minerals (ZINC PO) Take by mouth.   Yes [provider]  OZEMPIC, 0.25 OR 0.5 MG/DOSE, 2 MG/3ML SOPN INJECT 0.25MG  UNDER THE SKIN EVERY WEEK 08/01/22  Yes Bacigalupo, Marzella Schlein, MD  ALPHA LIPOIC ACID PO Take by mouth.    [provider]  Blood Glucose Monitoring Suppl (ONE TOUCH ULTRA SYSTEM KIT) W/DEVICE KIT GLUCOMETER TEST STRIPS - Historical Medication  Check blood sugar daily ---- ULTRA ONE TOUCH  Started 11-Jul-2009 Active Comments: DX: 790.29 07/11/09   [provider]  Cranberry 500 MG CAPS Take 1 capsule by mouth daily. 08/19/07   [provider]  DIGESTIVE ENZYMES PO Take 1 capsule by mouth daily. 10/12/09   [provider]  gabapentin (NEURONTIN) 300 MG capsule Take 1 capsule (300 mg total) by mouth at bedtime. 02/20/23   Erasmo Downer, MD  LUTEIN PO Take by mouth.    [provider]  MULTIPLE VITAMIN PO Take 3 tablets by mouth daily.  08/19/07   [provider]  Na  Sulfate-K Sulfate-Mg Sulf 17.5-3.13-1.6 GM/177ML SOLN SMARTSIG:354 Milliliter(s) By Mouth Once Patient not taking: Reported on 02/03/2023 12/23/22   [provider]  ondansetron (ZOFRAN) 4 MG tablet Take 1 tablet (4 mg total) by mouth every 8 (eight) hours as needed for nausea or vomiting. 02/20/23   Toney Reil, MD  OVER THE COUNTER MEDICATION Take 2 tablets by mouth daily in the afternoon. EMMA    [provider]    Allergies as of 12/23/2022 - Review Complete 08/29/2022  Allergen Reaction Noted   Cortisone Other (See Comments) 02/03/2014   Tape Swelling and Dermatitis 02/03/2014    Ciprofloxacin Rash 02/03/2014   Floxacillin [floxacillin (flucloxacillin)] Rash 02/03/2014   Levaquin [levofloxacin in d5w] Rash 02/03/2014   Quinolones Rash 09/02/2016    Family History  Problem Relation Age of Onset   Cancer Father    Alcohol abuse Father    Hypertension Mother    Sarcoidosis Sister    Cancer Maternal Grandmother        colon   Dementia Maternal Grandmother     Social History   Socioeconomic History   Marital status: Married    Spouse name: Not on file   Number of children: Not on file   Years of education: Not on file   Highest education level: Associate degree: occupational, Scientist, product/process development, or vocational program  Occupational History   Not on file  Tobacco Use   Smoking status: Never   Smokeless tobacco: Never  Substance and Sexual Activity   Alcohol use: No   Drug use: No   Sexual activity: Not on file  Other Topics Concern   Not on file  Social History Narrative   Not on file   Social Determinants of Health   Financial Resource Strain: Low Risk  (02/03/2023)   Overall Financial Resource Strain (CARDIA)    Difficulty of Paying Living Expenses: Not hard at all  Food Insecurity: No Food Insecurity (02/03/2023)   Hunger Vital Sign    Worried About Running Out of Food in the Last Year: Never true    Ran Out of Food in the Last Year: Never true  Transportation Needs: No Transportation Needs (02/03/2023)   PRAPARE - Administrator, Civil Service (Medical): No    Lack of Transportation (Non-Medical): No  Physical Activity: Insufficiently Active (02/03/2023)   Exercise Vital Sign    Days of Exercise per Week: 2 days    Minutes of Exercise per Session: 20 min  Stress: No Stress Concern Present (02/03/2023)   [employer] of Occupational Health - Occupational Stress Questionnaire    Feeling of Stress : Not at all  Social Connections: Moderately Integrated (02/03/2023)   Social Connection and Isolation Panel [NHANES]    Frequency of  Communication with Friends and Family: Three times a week    Frequency of Social Gatherings with Friends and Family: Once a week    Attends Religious Services: More than 4 times per year    Active Member of [employer] or Organizations: No    Attends Engineer, structural: Not on file    Marital Status: Married  Catering manager Violence: Not on file    Review of Systems: See HPI, otherwise negative ROS  Physical Exam: BP (!) 150/91   Pulse 75   Temp (!) 96.5 F (35.8 C) (Temporal)   Resp 16   Wt 97.5 kg   LMP 11/05/2000   SpO2 100%   BMI 34.70 kg/m  General:   Alert,  pleasant and cooperative in NAD Head:  Normocephalic and atraumatic. Neck:  Supple; no masses or thyromegaly. Lungs:  Clear throughout to auscultation.    Heart:  Regular rate and rhythm. Abdomen:  Soft, nontender and nondistended. Normal bowel sounds, without guarding, and without rebound.   Neurologic:  Alert and  oriented x4;  grossly normal neurologically.  Impression/Plan: Veronica Butler is here for an endoscopy and colonoscopy to be performed for chronic GERD and colon cancer screening   Risks, benefits, limitations, and alternatives regarding  endoscopy and colonoscopy have been reviewed with the patient.  Questions have been answered.  All parties agreeable.   Lannette Donath, MD  02/21/2023, 8:01 AM

## 2023-02-21 NOTE — Op Note (Signed)
[facility] Gastroenterology Patient Name: Veronica Butler Procedure Date: 02/21/2023 8:03 AM MRN: [id number] Account #: [id number] Date of Birth: [birth year] Admit Type: Outpatient Age: 62 Room: [facility] ENDO ROOM 2 Gender: Female Note Status: Finalized Instrument Name: Prentice Docker [id number] Procedure:             Colonoscopy Indications:           Screening for colorectal malignant neoplasm, Last                         colonoscopy: February 2019 Providers:             Toney Reil MD, MD Medicines:             General Anesthesia Complications:         No immediate complications. Estimated blood loss: None. Procedure:             Pre-Anesthesia Assessment:                        - Prior to the procedure, a History and Physical was                         performed, and patient medications and allergies were                         reviewed. The patient is competent. The risks and                         benefits of the procedure and the sedation options and                         risks were discussed with the patient. All questions                         were answered and informed consent was obtained.                         Patient identification and proposed procedure were                         verified by the physician, the nurse, the                         anesthesiologist, the anesthetist and the technician                         in the pre-procedure area in the procedure room in the                         endoscopy suite. Mental Status Examination: alert and                         oriented. Airway Examination: normal oropharyngeal                         airway and neck mobility. Respiratory Examination:                         clear to auscultation. CV Examination:  normal.                         Prophylactic Antibiotics: The patient does not require                         prophylactic antibiotics. Prior Anticoagulants: The                          patient has taken no anticoagulant or antiplatelet                         agents. ASA Grade Assessment: III - A patient with                         severe systemic disease. After reviewing the risks and                         benefits, the patient was deemed in satisfactory                         condition to undergo the procedure. The anesthesia                         plan was to use general anesthesia. Immediately prior                         to administration of medications, the patient was                         re-assessed for adequacy to receive sedatives. The                         heart rate, respiratory rate, oxygen saturations,                         blood pressure, adequacy of pulmonary ventilation, and                         response to care were monitored throughout the                         procedure. The physical status of the patient was                         re-assessed after the procedure.                        After obtaining informed consent, the colonoscope was                         passed under direct vision. Throughout the procedure,                         the patient's blood pressure, pulse, and oxygen                         saturations were monitored continuously. The  Colonoscope was introduced through the anus and                         advanced to the the terminal ileum, with                         identification of the appendiceal orifice and IC                         valve. The colonoscopy was performed without                         difficulty. The patient tolerated the procedure well.                         The quality of the bowel preparation was evaluated                         using the BBPS [facility] Bowel Preparation Scale) with                         scores of: Right Colon = 3, Transverse Colon = 3 and                         Left Colon = 3 (entire mucosa seen well with no                         residual  staining, small fragments of stool or opaque                         liquid). The total BBPS score equals 9. The terminal                         ileum, ileocecal valve, appendiceal orifice, and                         rectum were photographed. Findings:      The perianal and digital rectal examinations were normal. Pertinent       negatives include normal sphincter tone and no palpable rectal lesions.      Two sessile polyps were found in the transverse colon and ascending       colon. The polyps were 3 to 6 mm in size. These polyps were removed with       a cold snare. Resection and retrieval were complete.      The retroflexed view of the distal rectum and anal verge was normal and       showed no anal or rectal abnormalities.      The terminal ileum appeared normal. Impression:            - Two 3 to 6 mm polyps in the transverse colon and in                         the ascending colon, removed with a cold snare.                         Resected and retrieved.                        -  The distal rectum and anal verge are normal on                         retroflexion view.                        - The examined portion of the ileum was normal. Recommendation:        - Discharge patient to home (with escort).                        - Resume previous diet today.                        - Continue present medications.                        - Await pathology results.                        - Repeat colonoscopy in 5 years for surveillance. Procedure Code(s):     --- Professional ---                        [phone], Colonoscopy, flexible; with removal of                         tumor(s), polyp(s), or other lesion(s) by snare                         technique Diagnosis Code(s):     --- Professional ---                        Z12.11, Encounter for screening for malignant neoplasm                         of colon                        D12.3, Benign neoplasm of transverse colon (hepatic                          flexure or splenic flexure)                        D12.2, Benign neoplasm of ascending colon CPT copyright 2022 [employer]. All rights reserved. The codes documented in this report are preliminary and upon coder review may  be revised to meet current compliance requirements. Dr. Libby Maw Toney Reil MD, MD 02/21/2023 8:41:41 AM This report has been signed electronically. Number of Addenda: 0 Note Initiated On: 02/21/2023 8:03 AM Scope Withdrawal Time: 0 hours 16 minutes 30 seconds  Total Procedure Duration: 0 hours 19 minutes 10 seconds  Estimated Blood Loss:  Estimated blood loss: none.      [facility]

## 2023-02-21 NOTE — Anesthesia Postprocedure Evaluation (Signed)
Anesthesia Post Note  Patient: Veronica Butler  Procedure(s) Performed: COLONOSCOPY WITH PROPOFOL ESOPHAGOGASTRODUODENOSCOPY (EGD) WITH PROPOFOL  Patient location during evaluation: PACU Anesthesia Type: General Level of consciousness: awake and alert, oriented and patient cooperative Pain management: pain level controlled Vital Signs Assessment: post-procedure vital signs reviewed and stable Respiratory status: spontaneous breathing, nonlabored ventilation and respiratory function stable Cardiovascular status: blood pressure returned to baseline and stable Postop Assessment: adequate PO intake Anesthetic complications: no   There were no known notable events for this encounter.   Last Vitals:  Vitals:   02/21/23 0853 02/21/23 0903  BP: 122/77 128/78  Pulse: 75 69  Resp: 16 16  Temp:    SpO2: 99% 99%    Last Pain:  Vitals:   02/21/23 0903  TempSrc:   PainSc: 0-No pain                 Reed Breech

## 2023-02-21 NOTE — Addendum Note (Signed)
Addendum  created 02/21/23 1314 by Lysbeth Penner, CRNA   Flowsheet accepted, Intraprocedure Flowsheets edited

## 2023-02-24 ENCOUNTER — Encounter: Payer: Self-pay | Admitting: Gastroenterology

## 2023-02-26 NOTE — Progress Notes (Signed)
Heart monitor notes 1 run of non sustained beats- 11 beats in length. Average HR was normal in 80s.

## 2023-03-06 ENCOUNTER — Encounter: Payer: BC Managed Care – PPO | Admitting: Family Medicine

## 2023-03-14 ENCOUNTER — Other Ambulatory Visit: Payer: Self-pay | Admitting: Family Medicine

## 2023-03-17 ENCOUNTER — Ambulatory Visit (INDEPENDENT_AMBULATORY_CARE_PROVIDER_SITE_OTHER): Payer: BC Managed Care – PPO | Admitting: Family Medicine

## 2023-03-17 ENCOUNTER — Encounter: Payer: Self-pay | Admitting: Family Medicine

## 2023-03-17 VITALS — BP 128/76 | HR 91 | Temp 97.8°F | Resp 12 | Ht 66.0 in | Wt 221.6 lb

## 2023-03-17 DIAGNOSIS — E1159 Type 2 diabetes mellitus with other circulatory complications: Secondary | ICD-10-CM | POA: Diagnosis not present

## 2023-03-17 DIAGNOSIS — E1142 Type 2 diabetes mellitus with diabetic polyneuropathy: Secondary | ICD-10-CM | POA: Diagnosis not present

## 2023-03-17 DIAGNOSIS — E039 Hypothyroidism, unspecified: Secondary | ICD-10-CM

## 2023-03-17 DIAGNOSIS — Z0000 Encounter for general adult medical examination without abnormal findings: Secondary | ICD-10-CM | POA: Diagnosis not present

## 2023-03-17 DIAGNOSIS — E538 Deficiency of other specified B group vitamins: Secondary | ICD-10-CM

## 2023-03-17 DIAGNOSIS — G500 Trigeminal neuralgia: Secondary | ICD-10-CM

## 2023-03-17 DIAGNOSIS — E1169 Type 2 diabetes mellitus with other specified complication: Secondary | ICD-10-CM

## 2023-03-17 DIAGNOSIS — E785 Hyperlipidemia, unspecified: Secondary | ICD-10-CM

## 2023-03-17 DIAGNOSIS — R809 Proteinuria, unspecified: Secondary | ICD-10-CM

## 2023-03-17 DIAGNOSIS — I152 Hypertension secondary to endocrine disorders: Secondary | ICD-10-CM

## 2023-03-17 DIAGNOSIS — E1129 Type 2 diabetes mellitus with other diabetic kidney complication: Secondary | ICD-10-CM

## 2023-03-17 DIAGNOSIS — E669 Obesity, unspecified: Secondary | ICD-10-CM

## 2023-03-17 MED ORDER — OZEMPIC (0.25 OR 0.5 MG/DOSE) 2 MG/3ML ~~LOC~~ SOPN: 0.5000 mg | PEN_INJECTOR | SUBCUTANEOUS | 1 refills | Status: DC

## 2023-03-17 NOTE — Assessment & Plan Note (Signed)
Recheck B12 level 

## 2023-03-17 NOTE — Assessment & Plan Note (Signed)
Lower end TSH with symptoms of night sweats and thinning hair. Last thyroid ultrasound in 2021 showed a 1.5 cm thyroid cyst not meeting criteria for biopsy or follow-up imaging. -Check TSH, cholesterol, A1c, kidney and liver function, and B12 levels. -Continue current Synthroid regimen.

## 2023-03-17 NOTE — Assessment & Plan Note (Signed)
Last A1c controlled Recheck A1c Noted weight gain while on Ozempic 0.25 mg once a week. -Increase Ozempic to 0.5 mg once a week. -Check A1c levels.

## 2023-03-17 NOTE — Assessment & Plan Note (Signed)
Declines ACE/ARB in the past Will recheck UACR annually

## 2023-03-17 NOTE — Assessment & Plan Note (Signed)
Well controlled Continue lifestyle modifications Recheck metabolic panel F/u in 6 months  

## 2023-03-17 NOTE — Progress Notes (Signed)
I,[name],acting as a Neurosurgeon for Shirlee Latch, MD.,have documented all relevant documentation on the behalf of Shirlee Latch, MD,as directed by  Shirlee Latch, MD while in the presence of Shirlee Latch, MD.    Complete physical exam   Patient: Veronica Butler   DOB: November 30, 1960   62 y.o. Female  MRN: [id number] Visit Date: 03/17/2023  Today's healthcare provider: Shirlee Latch, MD   Chief Complaint  Patient presents with   Annual Exam   Subjective    Veronica Butler is a 62 y.o. female who presents today for a complete physical exam.  She reports consuming a general diet. The patient does not participate in regular exercise at present. She generally feels fairly well. She reports sleeping fairly well. She does not have additional problems to discuss today.  HPI   Discussed the use of AI scribe software for clinical note transcription with the patient, who gave verbal consent to proceed.  History of Present Illness   The patient, with a history of diabetes, trigeminal neuralgia, and thyroid issues, presents with concerns about her thyroid function due to a recent TSH level that was on the lower end of the normal range. She also mentions worsening night sweats and thinning hair, which she wonders could be related to her thyroid function. She has been on Synthroid since 2010.  The patient also reports facial pain, which she describes as being behind her eyes and sensitive to touch. She mentions that this pain is similar to what she experienced with trigeminal neuralgia on the other side of her face seven years ago. She is currently on carbamazepine for this condition and has an upcoming appointment with a neurologist.  In addition, the patient reports weight gain despite being on Ozempic for diabetes management. She mentions that she has gained about ten pounds and is considering increasing her dose of Ozempic.  Lastly, the patient mentions chronic constipation,  for which she is on Miralax. She has adjusted the timing of her Synthroid and Miralax doses based on advice from a pharmacist.        Past Medical History:  Diagnosis Date   Allergy    Seasonal   Cancer (HCC) 2002   Cervical CA with hysterectomy.   Cataract    Minor. Born with   Diabetes (HCC) 08/22/2015   Dysplastic nevus 11/02/2019   Left spinal mid back. Moderate atypia, peripheral margin involved   Dysplastic nevus 11/02/2019   Right spinal mid back. Severe atypia, peripheral margin involved. Excised 11/29/2019, margins free.   Dysplastic nevus 05/01/2010   Right medial lower calf. Mild to moderate atypia, extends to one edge.   Dysplastic nevus 03/26/2022   Left Top of Shoulder, mild atypia   Heart murmur    Hx of dysplastic nevus 2007   multiple sites    Hx of dysplastic nevus 2007   multiple sites    Hypertension associated with diabetes (HCC) 04/05/2021   Migraines    PONV (postoperative nausea and vomiting)    Sleep apnea    CPAP, recently stopped using due to 50lb weight loss   Thyroid disease    Nodules   Trigeminal neuralgia    Past Surgical History:  Procedure Laterality Date   ABDOMINAL HYSTERECTOMY  2002   still has ovaries   CHOLECYSTECTOMY  1998   COLONOSCOPY WITH PROPOFOL N/A 10/28/2017   Procedure: COLONOSCOPY WITH PROPOFOL;  Surgeon: Pasty Spillers, MD;  Location: [facility];  Service: Endoscopy;  Laterality: N/A;  COLONOSCOPY WITH PROPOFOL N/A 02/21/2023   Procedure: COLONOSCOPY WITH PROPOFOL;  Surgeon: Toney Reil, MD;  Location: [facility] END[facility]COPY;  Service: Gastroenterology;  Laterality: N/A;   ESOPHAGOGASTRODUODENOSCOPY (EGD) WITH PROPOFOL N/A 02/21/2023   Procedure: ESOPHAGOGASTRODUODENOSCOPY (EGD) WITH PROPOFOL;  Surgeon: Toney Reil, MD;  Location: [facility]COPY;  Service: Gastroenterology;  Laterality: N/A;   HERNIA REPAIR     along with gallbladder surgery   navel cyst  1980'   removed   TONSILLECTOMY  at age 19    Social History   Socioeconomic History   Marital status: Married    Spouse name: Not on file   Number of children: Not on file   Years of education: Not on file   Highest education level: Associate degree: occupational, Scientist, product/process development, or vocational program  Occupational History   Not on file  Tobacco Use   Smoking status: Never   Smokeless tobacco: Never  Substance and Sexual Activity   Alcohol use: No   Drug use: No   Sexual activity: Not on file  Other Topics Concern   Not on file  Social History Narrative   Not on file   Social Determinants of Health   Financial Resource Strain: Low Risk  (02/03/2023)   Overall Financial Resource Strain (CARDIA)    Difficulty of Paying Living Expenses: Not hard at all  Food Insecurity: No Food Insecurity (02/03/2023)   Hunger Vital Sign    Worried About Running Out of Food in the Last Year: Never true    Ran Out of Food in the Last Year: Never true  Transportation Needs: No Transportation Needs (02/03/2023)   PRAPARE - Administrator, Civil Service (Medical): No    Lack of Transportation (Non-Medical): No  Physical Activity: Insufficiently Active (02/03/2023)   Exercise Vital Sign    Days of Exercise per Week: 2 days    Minutes of Exercise per Session: 20 min  Stress: No Stress Concern Present (02/03/2023)   [employer] of Occupational Health - Occupational Stress Questionnaire    Feeling of Stress : Not at all  Social Connections: Moderately Integrated (02/03/2023)   Social Connection and Isolation Panel [NHANES]    Frequency of Communication with Friends and Family: Three times a week    Frequency of Social Gatherings with Friends and Family: Once a week    Attends Religious Services: More than 4 times per year    Active Member of [employer] or Organizations: No    Attends Engineer, structural: Not on file    Marital Status: Married  Catering manager Violence: Not on file   Family Status  Relation Name Status    Father Reita Cliche Deceased   Mother Colton Alive   Sister  Alive   MGM Alice Deceased   Mat Aunt Multiple (Not Specified)   Nutritional therapist Multiple (Not Specified)   Family History  Problem Relation Age of Onset   Cancer Father    Alcohol abuse Father    Hypertension Mother    Sarcoidosis Sister    Cancer Maternal Grandmother        colon   Dementia Maternal Grandmother    Diabetes Maternal Aunt    Diabetes Paternal Uncle    Allergies  Allergen Reactions   Cortisone Other (See Comments)    High blood sugar   Tape Swelling and Dermatitis    Redness and itching.   Ciprofloxacin Rash   Floxacillin [Floxacillin (Flucloxacillin)] Rash   Levaquin [Levofloxacin In D5w] Rash  Quinolones Rash    Tendon pain as well as rash    Patient Care Team: Erasmo Downer, MD as PCP - General (Family Medicine) [facility], [facility]   Medications: Outpatient Medications Prior to Visit  Medication Sig   ALPHA LIPOIC ACID PO Take by mouth.   Blood Glucose Monitoring Suppl (ONE TOUCH ULTRA SYSTEM KIT) W/DEVICE KIT GLUCOMETER TEST STRIPS - Historical Medication  Check blood sugar daily ---- ULTRA ONE TOUCH  Started 11-Jul-2009 Active Comments: DX: 790.29   carbamazepine (TEGRETOL XR) 100 MG 12 hr tablet Take 1 tablet (100 mg total) by mouth daily.   Cranberry 500 MG CAPS Take 1 capsule by mouth daily.   DIGESTIVE ENZYMES PO Take 1 capsule by mouth daily.   gabapentin (NEURONTIN) 300 MG capsule Take 1 capsule (300 mg total) by mouth at bedtime.   levothyroxine (SYNTHROID) 25 MCG tablet TAKE 1 TABLET BY MOUTH DAILY BEFORE BREAKFAST AND 2 TABLETS BY MOUTH ON THE WEEKENDS   LUTEIN PO Take by mouth.   Magnesium 500 MG CAPS Take 2 tablets by mouth daily.    Misc Natural Products (ADRENAL) 200 MG CAPS Take 1 capsule by mouth 2 (two) times daily.    MULTIPLE VITAMIN PO Take 3 tablets by mouth daily.    Multiple Vitamins-Minerals (ZINC PO) Take by mouth.   Na Sulfate-K Sulfate-Mg Sulf 17.5-3.13-1.6  GM/177ML SOLN SMARTSIG:354 Milliliter(s) By Mouth Once (Patient not taking: Reported on 02/03/2023)   ondansetron (ZOFRAN) 4 MG tablet Take 1 tablet (4 mg total) by mouth every 8 (eight) hours as needed for nausea or vomiting.   OVER THE COUNTER MEDICATION Take 2 tablets by mouth daily in the afternoon. EMMA   [DISCONTINUED] OZEMPIC, 0.25 OR 0.5 MG/DOSE, 2 MG/3ML SOPN INJECT 0.25MG  UNDER THE SKIN EVERY WEEK   No facility-administered medications prior to visit.    Review of Systems  Constitutional:  Positive for activity change.  HENT:  Positive for ear pain, sinus pressure and sneezing.   Eyes:  Positive for pain.  Genitourinary:  Positive for urgency.  Musculoskeletal:  Positive for neck pain.  Allergic/Immunologic: Positive for environmental allergies.  Neurological:  Positive for headaches.      Objective    BP 128/76 Comment: home reading  Pulse 91   Temp 97.8 F (36.6 C) (Temporal)   Resp 12   Ht 5\' 6"  (1.676 m)   Wt 221 lb 9.6 oz (100.5 kg)   LMP 11/05/2000   SpO2 98%   BMI 35.77 kg/m  BP Readings from Last 3 Encounters:  03/17/23 128/76  02/21/23 128/78  02/03/23 123/72   Wt Readings from Last 3 Encounters:  03/17/23 221 lb 9.6 oz (100.5 kg)  02/21/23 215 lb (97.5 kg)  02/03/23 219 lb 9.6 oz (99.6 kg)       Physical Exam Vitals reviewed.  Constitutional:      General: She is not in acute distress.    Appearance: Normal appearance. She is well-developed. She is not diaphoretic.  HENT:     Head: Normocephalic and atraumatic.     Right Ear: Tympanic membrane, ear canal and external ear normal.     Left Ear: Tympanic membrane, ear canal and external ear normal.     Nose: Nose normal.     Mouth/Throat:     Mouth: Mucous membranes are moist.     Pharynx: Oropharynx is clear. No oropharyngeal exudate.  Eyes:     General: No scleral icterus.    Conjunctiva/sclera: Conjunctivae normal.  Pupils: Pupils are equal, round, and reactive to light.  Neck:      Thyroid: No thyromegaly.  Cardiovascular:     Rate and Rhythm: Normal rate and regular rhythm.     Pulses: Normal pulses.     Heart sounds: Normal heart sounds. No murmur heard. Pulmonary:     Effort: Pulmonary effort is normal. No respiratory distress.     Breath sounds: Normal breath sounds. No wheezing or rales.  Abdominal:     General: There is no distension.     Palpations: Abdomen is soft.     Tenderness: There is no abdominal tenderness.  Musculoskeletal:        General: No deformity.     Cervical back: Neck supple.     Right lower leg: No edema.     Left lower leg: No edema.  Lymphadenopathy:     Cervical: No cervical adenopathy.  Skin:    General: Skin is warm and dry.     Findings: No rash.  Neurological:     Mental Status: She is alert and oriented to person, place, and time. Mental status is at baseline.     Gait: Gait normal.  Psychiatric:        Mood and Affect: Mood normal.        Behavior: Behavior normal.        Thought Content: Thought content normal.     Results LABS TSH: 0.965  RADIOLOGY Thyroid ultrasound: 1.5 cm thyroid cyst, multiple sub-centimeter cysts in both lobes (2021)  DIAGNOSTIC Endoscopy: Chronic gastritis (March 2024)  Last depression screening scores    08/29/2022    1:12 PM 02/25/2022   10:44 AM 07/10/2021    8:26 AM  PHQ 2/9 Scores  PHQ - 2 Score 0 0 0  PHQ- 9 Score 1 0 2   Last fall risk screening    08/29/2022    1:12 PM  Fall Risk   Falls in the past year? 0  Number falls in past yr: 0  Injury with Fall? 0  Risk for fall due to : No Fall Risks  Follow up Falls evaluation completed   Last Audit-C alcohol use screening    02/03/2023   10:36 AM  Alcohol Use Disorder Test (AUDIT)  1. How often do you have a drink containing alcohol? 0  3. How often do you have six or more drinks on one occasion? 0   A score of 3 or more in women, and 4 or more in men indicates increased risk for alcohol abuse, EXCEPT if all of the  points are from question 1   No results found for any visits on 03/17/23.  Assessment & Plan    Routine Health Maintenance and Physical Exam  Exercise Activities and Dietary recommendations  Goals   None     Immunization History  Administered Date(s) Administered   Influenza Inj Mdck Quad Pf 06/10/2022   Influenza,inj,Quad PF,6+ Mos 06/01/2015, 08/01/2017, 07/17/2018, 05/19/2020, 07/10/2021   Influenza,inj,quad, With Preservative 06/16/2020   PFIZER(Purple Top)SARS-COV-2 Vaccination 12/13/2019, 01/05/2020, 09/13/2020   Pfizer Covid-19 Vaccine Bivalent Booster 87yrs & up 08/03/2021   Pneumococcal Polysaccharide-23 08/01/2017   Td 01/30/2018   Tdap 08/19/2007   Unspecified SARS-COV-2 Vaccination 12/16/2019, 01/15/2020, 08/16/2020    Health Maintenance  Topic Date Due   Zoster Vaccines- Shingrix (1 of 2) Never done   COVID-19 Vaccine (8 - 2023-24 season) 05/17/2022   HEMOGLOBIN A1C  02/28/2023   INFLUENZA VACCINE  04/17/2023   FOOT EXAM  08/30/2023   Diabetic kidney evaluation - Urine ACR  08/31/2023   MAMMOGRAM  01/27/2024   Diabetic kidney evaluation - eGFR measurement  02/03/2024   OPHTHALMOLOGY EXAM  02/03/2024   DTaP/Tdap/Td (3 - Td or Tdap) 01/31/2028   Colonoscopy  02/21/2028   Hepatitis C Screening  Completed   HIV Screening  Completed   HPV VACCINES  Aged Out    Discussed health benefits of physical activity, and encouraged her to engage in regular exercise appropriate for her age and condition.  Problem List Items Addressed This Visit       Cardiovascular and Mediastinum   Hypertension associated with diabetes (HCC)    Well controlled Continue lifestyle modifications Recheck metabolic panel F/u in 6 months       Relevant Medications   Semaglutide,0.25 or 0.5MG /DOS, (OZEMPIC, 0.25 OR 0.5 MG/DOSE,) 2 MG/3ML SOPN   Other Relevant Orders   Comprehensive metabolic panel     Endocrine   Hyperlipidemia associated with type 2 diabetes mellitus (HCC)     Reviewed last lipid panel Not currently on a statin Recheck FLP and CMP Discussed diet and exercise       Relevant Medications   Semaglutide,0.25 or 0.5MG /DOS, (OZEMPIC, 0.25 OR 0.5 MG/DOSE,) 2 MG/3ML SOPN   Other Relevant Orders   Comprehensive metabolic panel   Lipid panel   Diabetes (HCC)    Last A1c controlled Recheck A1c Noted weight gain while on Ozempic 0.25 mg once a week. -Increase Ozempic to 0.5 mg once a week. -Check A1c levels.      Relevant Medications   Semaglutide,0.25 or 0.5MG /DOS, (OZEMPIC, 0.25 OR 0.5 MG/DOSE,) 2 MG/3ML SOPN   Other Relevant Orders   Hemoglobin A1c   Hypothyroidism    Lower end TSH with symptoms of night sweats and thinning hair. Last thyroid ultrasound in 2021 showed a 1.5 cm thyroid cyst not meeting criteria for biopsy or follow-up imaging. -Check TSH, cholesterol, A1c, kidney and liver function, and B12 levels. -Continue current Synthroid regimen.      Relevant Orders   TSH   Microalbuminuria due to type 2 diabetes mellitus (HCC)    Declines ACE/ARB in the past Will recheck UACR annually      Relevant Medications   Semaglutide,0.25 or 0.5MG /DOS, (OZEMPIC, 0.25 OR 0.5 MG/DOSE,) 2 MG/3ML SOPN     Nervous and Auditory   Trigeminal neuralgia    Improvement noted with 80-85% reduction in pain. Pain localized behind eyes and in facial region. No correlation with thyroid issues identified. -Continue current carbamazepine regimen. -Neurology appointment scheduled for April 17, 2023 to evaluate for possible trigeminal neuralgia.      Relevant Orders   B12     Other   B12 deficiency    Recheck B12 level      Relevant Orders   B12   Obesity    Discussed importance of healthy weight management Discussed diet and exercise       Relevant Medications   Semaglutide,0.25 or 0.5MG /DOS, (OZEMPIC, 0.25 OR 0.5 MG/DOSE,) 2 MG/3ML SOPN   Other Visit Diagnoses     Encounter for annual physical exam    -  Primary   Relevant Orders   TSH    Hemoglobin A1c   Comprehensive metabolic panel   Lipid panel   B12           Night Sweats: Persistent despite being post-menopausal. Possible correlation with thyroid function. -Check TSH levels.  General Health Maintenance: -Continue annual dermatology appointments for skin checks. -Plan for "  Welcome to Medicare" visit next year -Schedule follow-up visit in six months.        Return in about 6 months (around 09/17/2023) for chronic disease f/u.     I, Shirlee Latch, MD, have reviewed all documentation for this visit. The documentation on 03/17/23 for the exam, diagnosis, procedures, and orders are all accurate and complete.   Veronica Butler, Veronica Schlein, MD, MPH [facility]

## 2023-03-17 NOTE — Assessment & Plan Note (Signed)
Improvement noted with 80-85% reduction in pain. Pain localized behind eyes and in facial region. No correlation with thyroid issues identified. -Continue current carbamazepine regimen. -Neurology appointment scheduled for April 17, 2023 to evaluate for possible trigeminal neuralgia.

## 2023-03-17 NOTE — Assessment & Plan Note (Signed)
Discussed importance of healthy weight management Discussed diet and exercise  

## 2023-03-17 NOTE — Assessment & Plan Note (Signed)
Reviewed last lipid panel Not currently on a statin Recheck FLP and CMP Discussed diet and exercise  

## 2023-03-18 DIAGNOSIS — E785 Hyperlipidemia, unspecified: Secondary | ICD-10-CM | POA: Diagnosis not present

## 2023-03-18 DIAGNOSIS — E1142 Type 2 diabetes mellitus with diabetic polyneuropathy: Secondary | ICD-10-CM | POA: Diagnosis not present

## 2023-03-18 DIAGNOSIS — E1169 Type 2 diabetes mellitus with other specified complication: Secondary | ICD-10-CM | POA: Diagnosis not present

## 2023-03-18 DIAGNOSIS — E1159 Type 2 diabetes mellitus with other circulatory complications: Secondary | ICD-10-CM | POA: Diagnosis not present

## 2023-03-18 DIAGNOSIS — E538 Deficiency of other specified B group vitamins: Secondary | ICD-10-CM | POA: Diagnosis not present

## 2023-03-18 DIAGNOSIS — E039 Hypothyroidism, unspecified: Secondary | ICD-10-CM | POA: Diagnosis not present

## 2023-03-18 DIAGNOSIS — Z0000 Encounter for general adult medical examination without abnormal findings: Secondary | ICD-10-CM | POA: Diagnosis not present

## 2023-03-19 ENCOUNTER — Encounter: Payer: Self-pay | Admitting: Dermatology

## 2023-03-19 ENCOUNTER — Ambulatory Visit: Payer: BC Managed Care – PPO | Admitting: Dermatology

## 2023-03-19 VITALS — BP 141/90 | HR 99

## 2023-03-19 DIAGNOSIS — L810 Postinflammatory hyperpigmentation: Secondary | ICD-10-CM

## 2023-03-19 DIAGNOSIS — L821 Other seborrheic keratosis: Secondary | ICD-10-CM

## 2023-03-19 DIAGNOSIS — L814 Other melanin hyperpigmentation: Secondary | ICD-10-CM | POA: Diagnosis not present

## 2023-03-19 DIAGNOSIS — L820 Inflamed seborrheic keratosis: Secondary | ICD-10-CM

## 2023-03-19 LAB — COMPREHENSIVE METABOLIC PANEL
ALT: 18 IU/L (ref 0–32)
AST: 25 IU/L (ref 0–40)
Albumin: 4.6 g/dL (ref 3.9–4.9)
Alkaline Phosphatase: 91 IU/L (ref 44–121)
BUN/Creatinine Ratio: 18 (ref 12–28)
BUN: 14 mg/dL (ref 8–27)
Bilirubin Total: 0.4 mg/dL (ref 0.0–1.2)
CO2: 24 mmol/L (ref 20–29)
Calcium: 9.4 mg/dL (ref 8.7–10.3)
Chloride: 103 mmol/L (ref 96–106)
Creatinine, Ser: 0.78 mg/dL (ref 0.57–1.00)
Globulin, Total: 2 g/dL (ref 1.5–4.5)
Glucose: 115 mg/dL — ABNORMAL HIGH (ref 70–99)
Potassium: 4.5 mmol/L (ref 3.5–5.2)
Sodium: 140 mmol/L (ref 134–144)
Total Protein: 6.6 g/dL (ref 6.0–8.5)
eGFR: 85 mL/min/{1.73_m2} (ref >59)

## 2023-03-19 LAB — LIPID PANEL
Chol/HDL Ratio: 4.4 ratio (ref 0.0–4.4)
Cholesterol, Total: 215 mg/dL — ABNORMAL HIGH (ref 100–199)
HDL: 49 mg/dL (ref >39)
LDL Chol Calc (NIH): 129 mg/dL — ABNORMAL HIGH (ref 0–99)
Triglycerides: 209 mg/dL — ABNORMAL HIGH (ref 0–149)
VLDL Cholesterol Cal: 37 mg/dL (ref 5–40)

## 2023-03-19 LAB — TSH: TSH: 1.25 u[IU]/mL (ref 0.450–4.500)

## 2023-03-19 LAB — HEMOGLOBIN A1C
Est. average glucose Bld gHb Est-mCnc: 126 mg/dL
Hgb A1c MFr Bld: 6 % — ABNORMAL HIGH (ref 4.8–5.6)

## 2023-03-19 LAB — VITAMIN B12: Vitamin B-12: 1188 pg/mL (ref 232–1245)

## 2023-03-19 NOTE — Patient Instructions (Addendum)
Seborrheic Keratosis  What causes seborrheic keratoses? Seborrheic keratoses are harmless, common skin growths that first appear during adult life.  As time goes by, more growths appear.  Some people may develop a large number of them.  Seborrheic keratoses appear on both covered and uncovered body parts.  They are not caused by sunlight.  The tendency to develop seborrheic keratoses can be inherited.  They vary in color from skin-colored to gray, brown, or even black.  They can be either smooth or have a rough, warty surface.   Seborrheic keratoses are superficial and look as if they were stuck on the skin.  Under the microscope this type of keratosis looks like layers upon layers of skin.  That is why at times the top layer may seem to fall off, but the rest of the growth remains and re-grows.    Treatment Seborrheic keratoses do not need to be treated, but can easily be removed in the office.  Seborrheic keratoses often cause symptoms when they rub on clothing or jewelry.  Lesions can be in the way of shaving.  If they become inflamed, they can cause itching, soreness, or burning.  Removal of a seborrheic keratosis can be accomplished by freezing, burning, or surgery. If any spot bleeds, scabs, or grows rapidly, please return to have it checked, as these can be an indication of a skin cancer.  Cryotherapy Aftercare  Wash gently with soap and water everyday.   Apply Vaseline and Band-Aid daily until healed.      Due to recent changes in healthcare laws, you may see results of your pathology and/or laboratory studies on MyChart before the doctors have had a chance to review them. We understand that in some cases there may be results that are confusing or concerning to you. Please understand that not all results are received at the same time and often the doctors may need to interpret multiple results in order to provide you with the best plan of care or course of treatment. Therefore, we ask  that you please give us 2 business days to thoroughly review all your results before contacting the office for clarification. Should we see a critical lab result, you will be contacted sooner.   If You Need Anything After Your Visit  If you have any questions or concerns for your doctor, please call our main line at [phone] and press option 4 to reach your doctor's medical assistant. If no one answers, please leave a voicemail as directed and we will return your call as soon as possible. Messages left after 4 pm will be answered the following business day.   You may also send us a message via MyChart. We typically respond to MyChart messages within 1-2 business days.  For prescription refills, please ask your pharmacy to contact our office. Our fax number is [phone].  If you have an urgent issue when the clinic is closed that cannot wait until the next business day, you can page your doctor at the number below.    Please note that while we do our best to be available for urgent issues outside of office hours, we are not available 24/7.   If you have an urgent issue and are unable to reach us, you may choose to seek medical care at your doctor's office, retail clinic, urgent care center, or emergency room.  If you have a medical emergency, please immediately call 911 or go to the emergency department.  Pager Numbers  - Dr. Kowalski:   [phone]  - Dr. Moye: [phone]  - Dr. Stewart: [phone]  In the event of inclement weather, please call our main line at [phone] for an update on the status of any delays or closures.  Dermatology Medication Tips: Please keep the boxes that topical medications come in in order to help keep track of the instructions about where and how to use these. Pharmacies typically print the medication instructions only on the boxes and not directly on the medication tubes.   If your medication is too expensive, please contact our office at  [phone] option 4 or send us a message through MyChart.   We are unable to tell what your co-pay for medications will be in advance as this is different depending on your insurance coverage. However, we may be able to find a substitute medication at lower cost or fill out paperwork to get insurance to cover a needed medication.   If a prior authorization is required to get your medication covered by your insurance company, please allow us 1-2 business days to complete this process.  Drug prices often vary depending on where the prescription is filled and some pharmacies may offer cheaper prices.  The website www.goodrx.com contains coupons for medications through different pharmacies. The prices here do not account for what the cost may be with help from insurance (it may be cheaper with your insurance), but the website can give you the price if you did not use any insurance.  - You can print the associated coupon and take it with your prescription to the pharmacy.  - You may also stop by our office during regular business hours and pick up a GoodRx coupon card.  - If you need your prescription sent electronically to a different pharmacy, notify our office through [facility] MyChart or by phone at [phone] option 4.     Si Usted Necesita Algo Despus de Su Visita  Tambin puede enviarnos un mensaje a travs de MyChart. Por lo general respondemos a los mensajes de MyChart en el transcurso de 1 a 2 das hbiles.  Para renovar recetas, por favor pida a su farmacia que se ponga en contacto con nuestra oficina. Nuestro nmero de fax es el [phone].  Si tiene un asunto urgente cuando la clnica est cerrada y que no puede esperar hasta el siguiente da hbil, puede llamar/localizar a su doctor(a) al nmero que aparece a continuacin.   Por favor, tenga en cuenta que aunque hacemos todo lo posible para estar disponibles para asuntos urgentes fuera del horario de oficina, no estamos  disponibles las 24 horas del da, los 7 das de la semana.   Si tiene un problema urgente y no puede comunicarse con nosotros, puede optar por buscar atencin mdica  en el consultorio de su doctor(a), en una clnica privada, en un centro de atencin urgente o en una sala de emergencias.  Si tiene una emergencia mdica, por favor llame inmediatamente al 911 o vaya a la sala de emergencias.  Nmeros de bper  - Dr. Kowalski: [phone]  - Dra. Moye: [phone]  - Dra. Stewart: [phone]  En caso de inclemencias del tiempo, por favor llame a nuestra lnea principal al [phone] para una actualizacin sobre el estado de cualquier retraso o cierre.  Consejos para la medicacin en dermatologa: Por favor, guarde las cajas en las que vienen los medicamentos de uso tpico para ayudarle a seguir las instrucciones sobre dnde y cmo usarlos. Las farmacias generalmente imprimen las instrucciones del medicamento slo en   las cajas y no directamente en los tubos del medicamento.   Si su medicamento es muy caro, por favor, pngase en contacto con nuestra oficina llamando al [phone] y presione la opcin 4 o envenos un mensaje a travs de MyChart.   No podemos decirle cul ser su copago por los medicamentos por adelantado ya que esto es diferente dependiendo de la cobertura de su seguro. Sin embargo, es posible que podamos encontrar un medicamento sustituto a menor costo o llenar un formulario para que el seguro cubra el medicamento que se considera necesario.   Si se requiere una autorizacin previa para que su compaa de seguros cubra su medicamento, por favor permtanos de 1 a 2 das hbiles para completar este proceso.  Los precios de los medicamentos varan con frecuencia dependiendo del lugar de dnde se surte la receta y alguna farmacias pueden ofrecer precios ms baratos.  El sitio web www.goodrx.com tiene cupones para medicamentos de diferentes farmacias. Los precios aqu no  tienen en cuenta lo que podra costar con la ayuda del seguro (puede ser ms barato con su seguro), pero el sitio web puede darle el precio si no utiliz ningn seguro.  - Puede imprimir el cupn correspondiente y llevarlo con su receta a la farmacia.  - Tambin puede pasar por nuestra oficina durante el horario de atencin regular y recoger una tarjeta de cupones de GoodRx.  - Si necesita que su receta se enve electrnicamente a una farmacia diferente, informe a nuestra oficina a travs de MyChart de [place] o por telfono llamando al [phone] y presione la opcin 4.  

## 2023-03-19 NOTE — Progress Notes (Signed)
   Follow-Up Visit   Subjective  Veronica Butler is a 62 y.o. female who presents for the following: spot at back that has been crusted and bleeds. Patient reports spot came off in shower this morning. Also has a irritation at left upper arm she would like checked.    The following portions of the chart were reviewed this encounter and updated as appropriate: medications, allergies, medical history  Review of Systems:  No other skin or systemic complaints except as noted in HPI or Assessment and Plan.  Objective  Well appearing patient in no apparent distress; mood and affect are within normal limits.   A focused examination was performed of the following areas: back  Relevant exam findings are noted in the Assessment and Plan.  left posterior flank x 1 Erythematous stuck-on, waxy papule    Assessment & Plan   SEBORRHEIC KERATOSIS - Stuck-on, waxy, tan-brown papules and/or plaques  - Benign-appearing - Discussed benign etiology and prognosis. - Observe - Call for any changes  LENTIGINES Exam: scattered tan macules Due to sun exposure Treatment Plan: Benign-appearing, observe. Recommend daily broad spectrum sunscreen SPF 30+ to sun-exposed areas, reapply every 2 hours as needed.  Call for any changes   Post Inflammatory Hyperpigmentation  Due to bite reaction vs trauma vs other skin irritation  Exam: small faint pink tan patch at left medial upper arm  Treatment Plan: Reassurance will gradually fade.  Benign. Observe   Inflamed seborrheic keratosis left posterior flank x 1  Symptomatic, irritating, patient would like treated.  Destruction of lesion - left posterior flank x 1  Destruction method: cryotherapy   Informed consent: discussed and consent obtained   Lesion destroyed using liquid nitrogen: Yes   Region frozen until ice ball extended beyond lesion: Yes   Outcome: patient tolerated procedure well with no complications   Post-procedure details: wound  care instructions given   Additional details:  Prior to procedure, discussed risks of blister formation, small wound, skin dyspigmentation, or rare scar following cryotherapy. Recommend Vaseline ointment to treated areas while healing.     Return for keep follow up as scheduled .  I, Asher Muir, CMA, am acting as scribe for Willeen Niece, MD.   Documentation: I have reviewed the above documentation for accuracy and completeness, and I agree with the above.  Willeen Niece, MD

## 2023-03-21 ENCOUNTER — Encounter: Payer: Self-pay | Admitting: Family Medicine

## 2023-04-01 ENCOUNTER — Encounter: Payer: Self-pay | Admitting: Dermatology

## 2023-04-01 ENCOUNTER — Ambulatory Visit (INDEPENDENT_AMBULATORY_CARE_PROVIDER_SITE_OTHER): Payer: BC Managed Care – PPO | Admitting: Dermatology

## 2023-04-01 VITALS — BP 149/89 | HR 89

## 2023-04-01 DIAGNOSIS — D2262 Melanocytic nevi of left upper limb, including shoulder: Secondary | ICD-10-CM

## 2023-04-01 DIAGNOSIS — D229 Melanocytic nevi, unspecified: Secondary | ICD-10-CM

## 2023-04-01 DIAGNOSIS — D2272 Melanocytic nevi of left lower limb, including hip: Secondary | ICD-10-CM

## 2023-04-01 DIAGNOSIS — W908XXA Exposure to other nonionizing radiation, initial encounter: Secondary | ICD-10-CM

## 2023-04-01 DIAGNOSIS — Z86018 Personal history of other benign neoplasm: Secondary | ICD-10-CM

## 2023-04-01 DIAGNOSIS — L821 Other seborrheic keratosis: Secondary | ICD-10-CM

## 2023-04-01 DIAGNOSIS — L309 Dermatitis, unspecified: Secondary | ICD-10-CM | POA: Diagnosis not present

## 2023-04-01 DIAGNOSIS — L578 Other skin changes due to chronic exposure to nonionizing radiation: Secondary | ICD-10-CM

## 2023-04-01 DIAGNOSIS — R239 Unspecified skin changes: Secondary | ICD-10-CM

## 2023-04-01 DIAGNOSIS — D224 Melanocytic nevi of scalp and neck: Secondary | ICD-10-CM

## 2023-04-01 DIAGNOSIS — D225 Melanocytic nevi of trunk: Secondary | ICD-10-CM

## 2023-04-01 DIAGNOSIS — Z1283 Encounter for screening for malignant neoplasm of skin: Secondary | ICD-10-CM | POA: Diagnosis not present

## 2023-04-01 DIAGNOSIS — L814 Other melanin hyperpigmentation: Secondary | ICD-10-CM

## 2023-04-01 DIAGNOSIS — L720 Epidermal cyst: Secondary | ICD-10-CM

## 2023-04-01 MED ORDER — HYDROCORTISONE 2.5 % EX CREA: TOPICAL_CREAM | CUTANEOUS | 0 refills | Status: AC

## 2023-04-01 NOTE — Patient Instructions (Addendum)
     Melanoma ABCDEs  Melanoma is the most dangerous type of skin cancer, and is the leading cause of death from skin disease.  You are more likely to develop melanoma if you: Have light-colored skin, light-colored eyes, or red or blond hair Spend a lot of time in the sun Tan regularly, either outdoors or in a tanning bed Have had blistering sunburns, especially during childhood Have a close family member who has had a melanoma Have atypical moles or large birthmarks  Early detection of melanoma is key since treatment is typically straightforward and cure rates are extremely high if we catch it early.   The first sign of melanoma is often a change in a mole or a new dark spot.  The ABCDE system is a way of remembering the signs of melanoma.  A for asymmetry:  The two halves do not match. B for border:  The edges of the growth are irregular. C for color:  A mixture of colors are present instead of an even brown color. D for diameter:  Melanomas are usually (but not always) greater than 6mm - the size of a pencil eraser. E for evolution:  The spot keeps changing in size, shape, and color.  Please check your skin once per month between visits. You can use a small mirror in front and a large mirror behind you to keep an eye on the back side or your body.   If you see any new or changing lesions before your next follow-up, please call to schedule a visit.  Please continue daily skin protection including broad spectrum sunscreen SPF 30+ to sun-exposed areas, reapplying every 2 hours as needed when you're outdoors.   Staying in the shade or wearing long sleeves, sun glasses (UVA+UVB protection) and wide brim hats (4-inch brim around the entire circumference of the hat) are also recommended for sun protection.    Due to recent changes in healthcare laws, you may see results of your pathology and/or laboratory studies on MyChart before the doctors have had a chance to review them. We  understand that in some cases there may be results that are confusing or concerning to you. Please understand that not all results are received at the same time and often the doctors may need to interpret multiple results in order to provide you with the best plan of care or course of treatment. Therefore, we ask that you please give us 2 business days to thoroughly review all your results before contacting the office for clarification. Should we see a critical lab result, you will be contacted sooner.   If You Need Anything After Your Visit  If you have any questions or concerns for your doctor, please call our main line at [phone] and press option 4 to reach your doctor's medical assistant. If no one answers, please leave a voicemail as directed and we will return your call as soon as possible. Messages left after 4 pm will be answered the following business day.   You may also send us a message via MyChart. We typically respond to MyChart messages within 1-2 business days.  For prescription refills, please ask your pharmacy to contact our office. Our fax number is [phone].  If you have an urgent issue when the clinic is closed that cannot wait until the next business day, you can page your doctor at the number below.    Please note that while we do our best to be available for urgent issues   outside of office hours, we are not available 24/7.   If you have an urgent issue and are unable to reach us, you may choose to seek medical care at your doctor's office, retail clinic, urgent care center, or emergency room.  If you have a medical emergency, please immediately call 911 or go to the emergency department.  Pager Numbers  - Dr. Kowalski: [phone]  - Dr. Moye: [phone]  - Dr. Stewart: [phone]  In the event of inclement weather, please call our main line at [phone] for an update on the status of any delays or closures.  Dermatology Medication Tips: Please  keep the boxes that topical medications come in in order to help keep track of the instructions about where and how to use these. Pharmacies typically print the medication instructions only on the boxes and not directly on the medication tubes.   If your medication is too expensive, please contact our office at [phone] option 4 or send us a message through MyChart.   We are unable to tell what your co-pay for medications will be in advance as this is different depending on your insurance coverage. However, we may be able to find a substitute medication at lower cost or fill out paperwork to get insurance to cover a needed medication.   If a prior authorization is required to get your medication covered by your insurance company, please allow us 1-2 business days to complete this process.  Drug prices often vary depending on where the prescription is filled and some pharmacies may offer cheaper prices.  The website www.goodrx.com contains coupons for medications through different pharmacies. The prices here do not account for what the cost may be with help from insurance (it may be cheaper with your insurance), but the website can give you the price if you did not use any insurance.  - You can print the associated coupon and take it with your prescription to the pharmacy.  - You may also stop by our office during regular business hours and pick up a GoodRx coupon card.  - If you need your prescription sent electronically to a different pharmacy, notify our office through [facility] MyChart or by phone at [phone] option 4.     Si Usted Necesita Algo Despus de Su Visita  Tambin puede enviarnos un mensaje a travs de MyChart. Por lo general respondemos a los mensajes de MyChart en el transcurso de 1 a 2 das hbiles.  Para renovar recetas, por favor pida a su farmacia que se ponga en contacto con nuestra oficina. Nuestro nmero de fax es el [phone].  Si tiene un asunto urgente  cuando la clnica est cerrada y que no puede esperar hasta el siguiente da hbil, puede llamar/localizar a su doctor(a) al nmero que aparece a continuacin.   Por favor, tenga en cuenta que aunque hacemos todo lo posible para estar disponibles para asuntos urgentes fuera del horario de oficina, no estamos disponibles las 24 horas del da, los 7 das de la semana.   Si tiene un problema urgente y no puede comunicarse con nosotros, puede optar por buscar atencin mdica  en el consultorio de su doctor(a), en una clnica privada, en un centro de atencin urgente o en una sala de emergencias.  Si tiene una emergencia mdica, por favor llame inmediatamente al 911 o vaya a la sala de emergencias.  Nmeros de bper  - Dr. Kowalski: [phone]  - Dra. Moye: [phone]  - Dra. Stewart: [phone]  En caso   de inclemencias del tiempo, por favor llame a nuestra lnea principal al [phone] para una actualizacin sobre el estado de cualquier retraso o cierre.  Consejos para la medicacin en dermatologa: Por favor, guarde las cajas en las que vienen los medicamentos de uso tpico para ayudarle a seguir las instrucciones sobre dnde y cmo usarlos. Las farmacias generalmente imprimen las instrucciones del medicamento slo en las cajas y no directamente en los tubos del medicamento.   Si su medicamento es muy caro, por favor, pngase en contacto con nuestra oficina llamando al [phone] y presione la opcin 4 o envenos un mensaje a travs de MyChart.   No podemos decirle cul ser su copago por los medicamentos por adelantado ya que esto es diferente dependiendo de la cobertura de su seguro. Sin embargo, es posible que podamos encontrar un medicamento sustituto a menor costo o llenar un formulario para que el seguro cubra el medicamento que se considera necesario.   Si se requiere una autorizacin previa para que su compaa de seguros cubra su medicamento, por favor permtanos de 1 a 2  das hbiles para completar este proceso.  Los precios de los medicamentos varan con frecuencia dependiendo del lugar de dnde se surte la receta y alguna farmacias pueden ofrecer precios ms baratos.  El sitio web www.goodrx.com tiene cupones para medicamentos de diferentes farmacias. Los precios aqu no tienen en cuenta lo que podra costar con la ayuda del seguro (puede ser ms barato con su seguro), pero el sitio web puede darle el precio si no utiliz ningn seguro.  - Puede imprimir el cupn correspondiente y llevarlo con su receta a la farmacia.  - Tambin puede pasar por nuestra oficina durante el horario de atencin regular y recoger una tarjeta de cupones de GoodRx.  - Si necesita que su receta se enve electrnicamente a una farmacia diferente, informe a nuestra oficina a travs de MyChart de [facility] o por telfono llamando al [phone] y presione la opcin 4.  

## 2023-04-01 NOTE — Progress Notes (Signed)
Follow-Up Visit   Subjective  Veronica Butler is a 62 y.o. female who presents for the following: Skin Cancer Screening and Full Body Skin Exam  The patient presents for Total-Body Skin Exam (TBSE) for skin cancer screening and mole check. The patient has spots, moles and lesions to be evaluated, some may be new or changing.    The following portions of the chart were reviewed this encounter and updated as appropriate: medications, allergies, medical history  Review of Systems:  No other skin or systemic complaints except as noted in HPI or Assessment and Plan.  Objective  Well appearing patient in no apparent distress; mood and affect are within normal limits.  A full examination was performed including scalp, head, eyes, ears, nose, lips, neck, chest, axillae, abdomen, back, buttocks, bilateral upper extremities, bilateral lower extremities, hands, feet, fingers, toes, fingernails, and toenails. All findings within normal limits unless otherwise noted below.   Relevant physical exam findings are noted in the Assessment and Plan.       Nevus vs SK, L temporal scalp      SK vs Lentigo, R ear concha   Assessment & Plan   SKIN CANCER SCREENING PERFORMED TODAY.  ACTINIC DAMAGE - Chronic condition, secondary to cumulative UV/sun exposure - diffuse scaly erythematous macules with underlying dyspigmentation - Recommend daily broad spectrum sunscreen SPF 30+ to sun-exposed areas, reapply every 2 hours as needed.  - Staying in the shade or wearing long sleeves, sun glasses (UVA+UVB protection) and wide brim hats (4-inch brim around the entire circumference of the hat) are also recommended for sun protection.  - Call for new or changing lesions.  LENTIGINES, SEBORRHEIC KERATOSES, HEMANGIOMAS - Benign normal skin lesions - Benign-appearing - Call for any changes  MELANOCYTIC NEVI - Tan-brown and/or pink-flesh-colored symmetric macules and papules, new photos of back  today  - 4 mm Medium dark brown macule slight irregular border. Left lateral breast. Photo compared, no change   - 3 mm brown macule. Left foot dorsum, photo compared (03/14/21) no change   - 5 mm gray brown macule, slightly waxy. Left dorsal hand. Blue nevus vs SK, Stable   - 5 x 4 mm brown macule, darker center. Left spinal upper back, photo compared (07/24/20), no change  - 2.0 mm med dark brown macule, right breast.  - 4.0 x 3.0 mm med dark brown macule L med clavicle  - 2.5 mm waxy dark brown macule L temporal scalp (Nevus vs SK), photo taken today, no change per pt, recheck on f/up  - 4mm med brown macule R postauricular  - Benign appearing on exam today - Observation - Call clinic for new or changing moles - Recommend daily use of broad spectrum spf 30+ sunscreen to sun-exposed areas.   Lentigo vs nevus vrs SK - Stable compared to photo (03/26/22), New photo taken today. Exam:  Right Ear Concha  4.0 mm speckled brown macule with irregular border- pt states present for years no changes, photo compared, no change   Treatment Plan:   Benign-appearing.  Observation.  Call clinic for new or changing lesions.  Recommend daily use of broad spectrum spf 30+ sunscreen to sun-exposed areas.    Milia vrs other cyst - 2 mm flesh-white papule. Left lower eyelid  - type of cyst - benign - may be extracted if symptomatic - observe  History of Dysplastic Nevi, multiple sites Light brown macule within scar of the left top of shoulder (mild dysplastic nevus). - No evidence  of recurrence today - Recommend regular full body skin exams - Recommend daily broad spectrum sunscreen SPF 30+ to sun-exposed areas, reapply every 2 hours as needed.  - Call if any new or changing lesions are noted between office visits  Dermatitis  Exam:  Small faint pink tan patches at left medial upper arm.   Treatment Plan: Start 2.5% hydrocortisone cream Apply to AA arm BID until improved    Return in  about 6 months (around 10/02/2023) for recheck marked moles.  ICherlyn Labella, CMA, am acting as scribe for Willeen Niece, MD .   Documentation: I have reviewed the above documentation for accuracy and completeness, and I agree with the above.  Willeen Niece, MD

## 2023-04-17 DIAGNOSIS — G500 Trigeminal neuralgia: Secondary | ICD-10-CM | POA: Diagnosis not present

## 2023-04-17 DIAGNOSIS — G501 Atypical facial pain: Secondary | ICD-10-CM | POA: Diagnosis not present

## 2023-04-17 DIAGNOSIS — R519 Headache, unspecified: Secondary | ICD-10-CM | POA: Diagnosis not present

## 2023-05-23 DIAGNOSIS — R519 Headache, unspecified: Secondary | ICD-10-CM | POA: Diagnosis not present

## 2023-05-23 DIAGNOSIS — G501 Atypical facial pain: Secondary | ICD-10-CM | POA: Diagnosis not present

## 2023-05-26 ENCOUNTER — Ambulatory Visit: Payer: Self-pay

## 2023-05-26 NOTE — Telephone Encounter (Signed)
Message from [place] T sent at 05/26/2023  1:05 PM EDT  Summary: possible sinus infection   Patient called stated she is experiencing face pain and fever. She thinks she has a sinus infection and request something be called in for her         Chief Complaint: facial pain Symptoms: fever, sore throat, fever to 100.3 (baseline is 97.5), sinus cavity and ears  making popping sounds, 2 teeth sore to right mouth, shooting pain to top of head, upper cheek pain  Frequency: 1 week  Pertinent Negatives: Patient denies cough, SOB Disposition: [] ED /[] Urgent Care (no appt availability in office) / [x] Appointment(In office/virtual)/ []  [facility]/ [] Home Care/ [] Refused Recommended Disposition /[] [facility]/ []  Follow-up with PCP Additional Notes: pt with h/o trigeminal neuralgia - she restarted Gabapentin offered relief tx 3 days then stopped helping   Reason for Disposition  [1] Sinus congestion (pressure, fullness) AND [2] present > 10 days  Answer Assessment - Initial Assessment Questions 1. LOCATION: "Where does it hurt?"      2  teeth sore right side  eye socket  shooting pain to top of head upper cheek, temple lower jaw 2. ONSET: "When did the sinus pain start?"  (e.g., hours, days)      1 week ago  3. SEVERITY: "How bad is the pain?"   (Scale 1-10; mild, moderate or severe)   - MILD (1-3): doesn't interfere with normal activities    - MODERATE (4-7): interferes with normal activities (e.g., work or school) or awakens from sleep   - SEVERE (8-10): excruciating pain and patient unable to do any normal activities        Severe  4. RECURRENT SYMPTOM: "Have you ever had sinus problems before?" If Yes, ask: "When was the last time?" and "What happened that time?"      yes 5. NASAL CONGESTION: "Is the nose blocked?" If Yes, ask: "Can you open it or must you breathe through your mouth?"     No- saline rinses 6. NASAL DISCHARGE: "Do you have discharge from your nose?" If so  ask, "What color?"     No  7. FEVER: "Do you have a fever?" If Yes, ask: "What is it, how was it measured, and when did it start?"      fever 8. OTHER SYMPTOMS: "Do you have any other symptoms?" (e.g., sore throat, cough, earache, difficulty breathing)     Face pain/ sore throat, fever  97.5 this 100.3, clicking in sinus cavity and ears popping , teeth sore right side  eye socket  shooting pain to top of head upper cheek, temple lower jaw, started Gabapentin last Monday helped forst 3 night now back  9. PREGNANCY: "Is there any chance you are pregnant?" "When was your last menstrual period?"     N/a  Protocols used: Sinus Pain or Congestion-A-AH

## 2023-05-27 ENCOUNTER — Encounter: Payer: Self-pay | Admitting: Family Medicine

## 2023-05-27 ENCOUNTER — Telehealth: Payer: BC Managed Care – PPO | Admitting: Family Medicine

## 2023-05-27 DIAGNOSIS — G500 Trigeminal neuralgia: Secondary | ICD-10-CM

## 2023-05-27 DIAGNOSIS — J0140 Acute pansinusitis, unspecified: Secondary | ICD-10-CM | POA: Diagnosis not present

## 2023-05-27 MED ORDER — FLUCONAZOLE 150 MG PO TABS: 150.0000 mg | ORAL_TABLET | Freq: Once | ORAL | 0 refills | Status: AC

## 2023-05-27 MED ORDER — AMOXICILLIN-POT CLAVULANATE 875-125 MG PO TABS: 1.0000 | ORAL_TABLET | Freq: Two times a day (BID) | ORAL | 0 refills | Status: AC

## 2023-05-27 NOTE — Progress Notes (Signed)
MyChart Video Visit    Virtual Visit via Video Note   This format is felt to be most appropriate for this patient at this time. Physical exam was limited by quality of the video and audio technology used for the visit.    Patient location: home Provider location: [facility] Persons involved in the visit: patient, provider   I discussed the limitations of evaluation and management by telemedicine and the availability of in person appointments. The patient expressed understanding and agreed to proceed.  Patient: Veronica Butler   DOB: Nov 10, 1960   62 y.o. Female  MRN: [id number] Visit Date: 05/27/2023  Today's healthcare provider: Shirlee Latch, MD   No chief complaint on file.  Subjective    HPI  Discussed the use of AI scribe software for clinical note transcription with the patient, who gave verbal consent to proceed.  History of Present Illness   The patient, with a history of trigeminal neuralgia, presents with a recent exacerbation of facial pain, fever, and symptoms suggestive of a sinus infection. The patient reports having had a period of relief from the facial pain for about six weeks after the last visit to the neurologist. However, the pain returned with increased intensity last week. The patient also reports experiencing dental pain and a sensation of tightness and throbbing in the upper teeth. The patient had a dental X-ray done in mid-June which was normal. The patient also reports a fever of 100.3 and 99.5 on consecutive days, which is higher than their normal body temperature. The patient also reports experiencing crackling sounds in the sinus cavities and a popping sensation in the right ear. The patient also reports a sore throat, localized towards the roof of the mouth. The patient also reports a twitching nerve beside the nostril, pain in the temple, upper cheek, lower jaw, and right eye socket, and a sharp pain on the top of the head.         Medications: Outpatient Medications Prior to Visit  Medication Sig   ALPHA LIPOIC ACID PO Take by mouth.   Blood Glucose Monitoring Suppl (ONE TOUCH ULTRA SYSTEM KIT) W/DEVICE KIT GLUCOMETER TEST STRIPS - Historical Medication  Check blood sugar daily ---- ULTRA ONE TOUCH  Started 11-Jul-2009 Active Comments: DX: 790.29   carbamazepine (TEGRETOL XR) 100 MG 12 hr tablet Take 1 tablet (100 mg total) by mouth daily.   Cranberry 500 MG CAPS Take 1 capsule by mouth daily.   DIGESTIVE ENZYMES PO Take 1 capsule by mouth daily.   hydrocortisone 2.5 % cream Apply to rash on left arm twice daily until improved.   levothyroxine (SYNTHROID) 25 MCG tablet TAKE 1 TABLET BY MOUTH DAILY BEFORE BREAKFAST AND 2 TABLETS BY MOUTH ON THE WEEKENDS   LUTEIN PO Take by mouth.   Magnesium 500 MG CAPS Take 2 tablets by mouth daily.    Misc Natural Products (ADRENAL) 200 MG CAPS Take 1 capsule by mouth 2 (two) times daily.    MULTIPLE VITAMIN PO Take 3 tablets by mouth daily.    Multiple Vitamins-Minerals (ZINC PO) Take by mouth.   OVER THE COUNTER MEDICATION Take 2 tablets by mouth daily in the afternoon. EMMA   Semaglutide,0.25 or 0.5MG /DOS, (OZEMPIC, 0.25 OR 0.5 MG/DOSE,) 2 MG/3ML SOPN Inject 0.5 mg into the skin once a week.   No facility-administered medications prior to visit.    Review of Systems      Objective    LMP 11/05/2000  Physical Exam Constitutional:      General: She is not in acute distress.    Appearance: Normal appearance.  HENT:     Head: Normocephalic.  Pulmonary:     Effort: Pulmonary effort is normal. No respiratory distress.  Neurological:     Mental Status: She is alert and oriented to person, place, and time. Mental status is at baseline.        Assessment & Plan     Problem List Items Addressed This Visit       Nervous and Auditory   Trigeminal neuralgia   Other Visit Diagnoses     Acute non-recurrent pansinusitis    -  Primary   Relevant  Medications   amoxicillin-clavulanate (AUGMENTIN) 875-125 MG tablet   fluconazole (DIFLUCAN) 150 MG tablet            Trigeminal Neuralgia Recent exacerbation of facial pain, previously managed with carbamazepine. Neurologist consultation confirmed diagnosis and suggested increased carbamazepine or addition of gabapentin as needed. Recent use of gabapentin, additional carbamazepine, and over-the-counter analgesics for severe pain. -Continue current regimen of carbamazepine and gabapentin as needed.  Suspected Sinus Infection New onset of fever, facial pain, tooth discomfort, and sinus symptoms suggestive of sinusitis. Recent use of oral steroids for trigeminal neuralgia pain. -Start Augmentin twice daily for 7 days. -Provide Fluconazole as prophylaxis for potential yeast infection secondary to antibiotic use.  General Health Maintenance -Address insurance issue with Ozempic prescription as needed.      Meds ordered this encounter  Medications   amoxicillin-clavulanate (AUGMENTIN) 875-125 MG tablet    Sig: Take 1 tablet by mouth 2 (two) times daily for 7 days.    Dispense:  14 tablet    Refill:  0   fluconazole (DIFLUCAN) 150 MG tablet    Sig: Take 1 tablet (150 mg total) by mouth once for 1 dose.    Dispense:  1 tablet    Refill:  0     Return if symptoms worsen or fail to improve.     I discussed the assessment and treatment plan with the patient. The patient was provided an opportunity to ask questions and all were answered. The patient agreed with the plan and demonstrated an understanding of the instructions.   The patient was advised to call back or seek an in-person evaluation if the symptoms worsen or if the condition fails to improve as anticipated.   Shirlee Latch, MD [facility] [phone] (phone) [phone] (fax)  [facility]

## 2023-06-04 ENCOUNTER — Encounter: Payer: Self-pay | Admitting: Family Medicine

## 2023-06-17 ENCOUNTER — Telehealth: Payer: Self-pay | Admitting: Family Medicine

## 2023-06-17 NOTE — Telephone Encounter (Signed)
Called [facility] and was given the following key number to start PA  RUE:[id number]

## 2023-06-17 NOTE — Telephone Encounter (Signed)
Recieved a fax from covermymeds to complete the Prior Authorization started for patient   key: [id number]

## 2023-06-20 ENCOUNTER — Telehealth: Payer: Self-pay | Admitting: Family Medicine

## 2023-06-20 NOTE — Telephone Encounter (Signed)
PA send as urgent

## 2023-06-20 NOTE — Telephone Encounter (Signed)
Recieved a fax from covermymeds for Ozempic (0.25 or 0.5MG /Dose) 2 MG/3ML Pen-injectors/ A prior Authorization has been started   key:[id number]

## 2023-06-24 ENCOUNTER — Ambulatory Visit (INDEPENDENT_AMBULATORY_CARE_PROVIDER_SITE_OTHER): Payer: BC Managed Care – PPO | Admitting: Family Medicine

## 2023-06-24 DIAGNOSIS — Z23 Encounter for immunization: Secondary | ICD-10-CM | POA: Diagnosis not present

## 2023-06-24 NOTE — Progress Notes (Signed)
Vaccine administration only. No E&M service today.   

## 2023-06-25 NOTE — Telephone Encounter (Signed)
Message from [employer] Your PA request has been denied. Additional information will be provided in the denial communication.

## 2023-06-25 NOTE — Telephone Encounter (Signed)
Please see other CRM's about this.

## 2023-06-27 ENCOUNTER — Telehealth: Payer: Self-pay | Admitting: Family Medicine

## 2023-06-27 NOTE — Telephone Encounter (Signed)
This received a denial. Today duplicate

## 2023-06-27 NOTE — Telephone Encounter (Signed)
Recieved a fax from covermymeds for Ozempic (0.25 or 0.5 MG/Dose) 2MG /3ML Pen injectors Has started.   key: [id number]

## 2023-07-06 ENCOUNTER — Other Ambulatory Visit: Payer: Self-pay | Admitting: Family Medicine

## 2023-08-01 ENCOUNTER — Ambulatory Visit: Payer: BC Managed Care – PPO | Admitting: Family Medicine

## 2023-08-01 ENCOUNTER — Ambulatory Visit: Payer: Self-pay

## 2023-08-01 VITALS — BP 147/89 | HR 88 | Ht 67.0 in | Wt 205.0 lb

## 2023-08-01 DIAGNOSIS — R300 Dysuria: Secondary | ICD-10-CM | POA: Diagnosis not present

## 2023-08-01 LAB — POCT URINALYSIS DIPSTICK
Bilirubin, UA: NEGATIVE
Blood, UA: NEGATIVE
Glucose, UA: NEGATIVE
Ketones, UA: NEGATIVE
Nitrite, UA: NEGATIVE
Protein, UA: NEGATIVE
Spec Grav, UA: 1.015 (ref 1.010–1.025)
Urobilinogen, UA: NEGATIVE U/dL — AB
pH, UA: 8 (ref 5.0–8.0)

## 2023-08-01 MED ORDER — FLUCONAZOLE 150 MG PO TABS: 150.0000 mg | ORAL_TABLET | Freq: Once | ORAL | 0 refills | Status: AC

## 2023-08-01 MED ORDER — AMOXICILLIN 500 MG PO CAPS: 500.0000 mg | ORAL_CAPSULE | Freq: Three times a day (TID) | ORAL | 0 refills | Status: AC

## 2023-08-01 NOTE — Telephone Encounter (Signed)
  Chief Complaint: Frequent urination, back/kidney pain Symptoms: above Frequency: yesterday Pertinent Negatives: Patient denies fever Disposition: [] ED /[] Urgent Care (no appt availability in office) / [x] Appointment(In office/virtual)/ []  [facility]/ [] Home Care/ [] Refused Recommended Disposition /[] [place] [facility]/ []  Follow-up with PCP Additional Notes: Pt has hx of UTIs and kidney stones. Pt thinks this is a UTI.   Summary: Question UTI   Day two of bladder pain, frequent  urination. Pain in kidney area.     Reason for Disposition  Side (flank) or lower back pain present  Answer Assessment - Initial Assessment Questions 1. SYMPTOM: "What's the main symptom you're concerned about?" (e.g., frequency, incontinence)     Frequency, back pain 2. ONSET: "When did the  s/s  start?"     yesterday 3. PAIN: "Is there any pain?" If Yes, ask: "How bad is it?" (Scale: 1-10; mild, moderate, severe)    Back/kidney pain 4. CAUSE: "What do you think is causing the symptoms?"     UTI 5. OTHER SYMPTOMS: "Do you have any other symptoms?" (e.g., blood in urine, fever, flank pain, pain with urination)     none  Protocols used: Urinary Symptoms-A-AH

## 2023-08-03 LAB — URINE CULTURE

## 2023-08-03 LAB — SPECIMEN STATUS REPORT

## 2023-08-05 NOTE — Progress Notes (Signed)
Established patient visit   Patient: Veronica Butler   DOB: 1961-07-26   62 y.o. Female  MRN: [id number] Visit Date: 08/01/2023  Today's healthcare provider: Mila Merry, MD   No chief complaint on file.  Subjective    Discussed the use of AI scribe software for clinical note transcription with the patient, who gave verbal consent to proceed.  History of Present Illness   Veronica Butler, a patient with a history of urinary tract infections (UTIs) and kidney stones, presented with recent onset abdominal pain. The pain began in the left kidney area and later spread to the bladder region, near the navel. The pain was constant and persisted overnight. The patient reported no similar pain in the past, and the current pain was not as severe as previous kidney stone episodes.  The patient reported increased urinary frequency, waking up three times during the night to urinate. However, there was no burning sensation during urination, which is typical for the patient during UTIs. There were no changes in bowel movements, no blood in the stool, and no fever or chills.  The patient has not had a UTI for approximately two years. She has a known history of white coat syndrome, which often results in higher blood pressure readings at the doctor's office compared to home measurements. The patient has been monitoring her blood pressure at home for a few months, using a cuff that goes around the upper arm.       Medications: Outpatient Medications Prior to Visit  Medication Sig   ALPHA LIPOIC ACID PO Take by mouth.   Blood Glucose Monitoring Suppl (ONE TOUCH ULTRA SYSTEM KIT) W/DEVICE KIT GLUCOMETER TEST STRIPS - Historical Medication  Check blood sugar daily ---- ULTRA ONE TOUCH  Started 11-Jul-2009 Active Comments: DX: 790.29   Cranberry 500 MG CAPS Take 1 capsule by mouth daily.   DIGESTIVE ENZYMES PO Take 1 capsule by mouth daily.   hydrocortisone 2.5 % cream Apply to rash on left arm twice daily  until improved.   levothyroxine (SYNTHROID) 25 MCG tablet TAKE 1 TABLET BY MOUTH DAILY BEFORE BREAKFAST AND 2 TABLETS BY MOUTH ON THE WEEKENDS   Semaglutide,0.25 or 0.5MG /DOS, (OZEMPIC, 0.25 OR 0.5 MG/DOSE,) 2 MG/3ML SOPN Inject 0.5 mg into the skin once a week.   [DISCONTINUED] carbamazepine (TEGRETOL XR) 100 MG 12 hr tablet Take 1 tablet (100 mg total) by mouth daily.   [DISCONTINUED] LUTEIN PO Take by mouth.   [DISCONTINUED] Magnesium 500 MG CAPS Take 2 tablets by mouth daily.    [DISCONTINUED] Misc Natural Products (ADRENAL) 200 MG CAPS Take 1 capsule by mouth 2 (two) times daily.    [DISCONTINUED] MULTIPLE VITAMIN PO Take 3 tablets by mouth daily.    [DISCONTINUED] Multiple Vitamins-Minerals (ZINC PO) Take by mouth.   [DISCONTINUED] OVER THE COUNTER MEDICATION Take 2 tablets by mouth daily in the afternoon. EMMA   No facility-administered medications prior to visit.   Review of Systems  Respiratory: Negative.  Negative for cough, shortness of breath and wheezing.   Cardiovascular:  Negative for chest pain, palpitations and leg swelling.  Neurological:  Negative for weakness and headaches.       Objective    BP (!) 147/89 (BP Location: Left Arm, Patient Position: Sitting, Cuff Size: Normal)   Pulse 88   Ht 5\' 7"  (1.702 m)   Wt 205 lb (93 kg)   LMP 11/05/2000   SpO2 97%   BMI 32.11 kg/m   Physical Exam  General appearance: Mildly obese female, cooperative and in no acute distress Head: Normocephalic, without obvious abnormality, atraumatic Respiratory: Respirations even and unlabored, normal respiratory rate Extremities: All extremities are intact.  Skin: Skin color, texture, turgor normal. No rashes seen  Psych: Appropriate mood and affect. Neurologic: Mental status: Alert, oriented to person, place, and time, thought content appropriate.     Results for orders placed or performed in visit on 08/01/23  Urine Culture   Specimen: Urine   Urine  Result Value Ref Range    Urine Culture, Routine Final report    Organism ID, Bacteria Comment   Specimen status report  Result Value Ref Range   specimen status report Comment   POCT urinalysis dipstick  Result Value Ref Range   Color, UA yellow    Clarity, UA clear    Glucose, UA Negative Negative   Bilirubin, UA neg    Ketones, UA neg    Spec Grav, UA 1.015 1.010 - 1.025   Blood, UA neg    pH, UA 8.0 5.0 - 8.0   Protein, UA Negative Negative   Urobilinogen, UA negative (A) 0.2 or 1.0 E.U./dL   Nitrite, UA neg    Leukocytes, UA Trace (A) Negative   Appearance     Odor      Assessment & Plan        Possible Urinary Tract Infection Reports of left kidney and bladder pain, increased urinary frequency, and history of UTIs. No fever, chills, or dysuria. Urinalysis inconclusive, culture pending. -Start Amoxicillin for 7 days, pending culture results. -If culture negative, discontinue antibiotic.  Hypertension Blood pressure well-controlled at home, reports of "white coat syndrome." -Continue current antihypertensive regimen.    No follow-ups on file.      Mila Merry, MD  [facility] [phone] (phone) [phone] (fax)  [facility]

## 2023-09-18 ENCOUNTER — Ambulatory Visit: Payer: BC Managed Care – PPO | Admitting: Family Medicine

## 2023-09-18 ENCOUNTER — Encounter: Payer: Self-pay | Admitting: Family Medicine

## 2023-09-18 VITALS — BP 139/86 | HR 85 | Ht 67.0 in | Wt 203.6 lb

## 2023-09-18 DIAGNOSIS — Z7985 Long-term (current) use of injectable non-insulin antidiabetic drugs: Secondary | ICD-10-CM

## 2023-09-18 DIAGNOSIS — R809 Proteinuria, unspecified: Secondary | ICD-10-CM

## 2023-09-18 DIAGNOSIS — E1129 Type 2 diabetes mellitus with other diabetic kidney complication: Secondary | ICD-10-CM

## 2023-09-18 DIAGNOSIS — K7581 Nonalcoholic steatohepatitis (NASH): Secondary | ICD-10-CM

## 2023-09-18 DIAGNOSIS — E785 Hyperlipidemia, unspecified: Secondary | ICD-10-CM | POA: Diagnosis not present

## 2023-09-18 DIAGNOSIS — E1142 Type 2 diabetes mellitus with diabetic polyneuropathy: Secondary | ICD-10-CM | POA: Diagnosis not present

## 2023-09-18 DIAGNOSIS — E1159 Type 2 diabetes mellitus with other circulatory complications: Secondary | ICD-10-CM

## 2023-09-18 DIAGNOSIS — E1169 Type 2 diabetes mellitus with other specified complication: Secondary | ICD-10-CM

## 2023-09-18 DIAGNOSIS — E039 Hypothyroidism, unspecified: Secondary | ICD-10-CM

## 2023-09-18 DIAGNOSIS — G500 Trigeminal neuralgia: Secondary | ICD-10-CM

## 2023-09-18 DIAGNOSIS — I152 Hypertension secondary to endocrine disorders: Secondary | ICD-10-CM

## 2023-09-18 NOTE — Assessment & Plan Note (Signed)
 On Synthroid  25 mcg daily with an 2 pills daily on weekends. Reports feeling better on the higher dose. Symptoms include fatigue, muscle pain, and widespread pain. Last thyroid  check was six months ago. Discussed the importance of maintaining a narrow therapeutic window for Synthroid . - Order thyroid  function test - Continue Synthroid  25 mcg daily with increased dose on weekends

## 2023-09-18 NOTE — Assessment & Plan Note (Signed)
Well controlled Continue lifestyle modifications Recheck metabolic panel F/u in 6 months  

## 2023-09-18 NOTE — Assessment & Plan Note (Signed)
 On Ozempic  0.5 mg. Discussed stability of Ozempic  for travel and options for maintaining medication temperature. Reports dry mouth as a side effect. Blood sugar levels are being monitored. Discussed the option to skip a week of Ozempic  during travel without significant impact. - Continue Ozempic  0.5 mg - Discuss travel options for Ozempic  including shelf stability and travel coolers - Suggest sour foods or over-the-counter dry mouth drops for dry mouth - Order A1c test - Order yearly urine test for protein

## 2023-09-18 NOTE — Assessment & Plan Note (Signed)
Declines ACE/ARB in the past Will recheck UACR annually

## 2023-09-18 NOTE — Assessment & Plan Note (Signed)
 Seeing a naturopathic doctor for cholesterol management and is on various supplements. No specific issues discussed during this visit. - Order cholesterol test

## 2023-09-18 NOTE — Progress Notes (Signed)
 Established patient visit   Patient: Veronica Butler   DOB: 03-Sep-1961   63 y.o. Female  MRN: [id number] Visit Date: 09/18/2023  Today's healthcare provider: Jon Eva, MD   Chief Complaint  Patient presents with   Medical Management of Chronic Issues    6 month follow-up   Hypertension    128/74 @ 10:55 am   Hyperlipidemia   Hypothyroidism    Heat and cold intolerance, changes in energy level (low)   Diabetes    Dry mouth nightly after increasing ozempic    Subjective    Hypertension  Hyperlipidemia  Diabetes   HPI     Medical Management of Chronic Issues    Additional comments: 6 month follow-up        Hypertension   Anxiety: Present.  Blurred vision: Absent.  Chest pain: Absent.  Chest pressure/discomfort: Absent.  Dyspnea: Absent.  Headaches: Absent.  Lower extremity edema: Absent.  Orthopnea: Absent.  Palpitations: Absent.  Paroxysmal nocturnal dyspnea: Absent.  Syncope: Absent. Additional comments: 128/74 @ 10:55 am        Hyperlipidemia   Chest pain: Absent.  Chest pressure/discomfort: Absent.  Dyspnea: Absent.  Focal weakness: Absent.  Lower extremity edema: Absent.  Numbness or tingling of extremity: Present.  Orthopnea: Absent.  Palpitations: Absent.  Paroxysmal nocturnal dyspnea: Absent.  Speech difficulty: Absent.  Syncope: Absent.        Hypothyroidism    Additional comments: Heat and cold intolerance, changes in energy level (low)        Diabetes   Blurred vision: Absent.  Chest pain: Absent.  Fatigue: Present.  Foot Ulcerations: Absent.  Nausea: Absent.  Paresthesia of the feet: Absent.  Polydipsia: Absent.  Polyuria: Present.  Visual changes: Absent.  Vomiting: Absent.  Weight loss: Absent.  Episodes of hypoglycemia: Present.  Home glucose fasting ranges  from 110-130 mg/dl.  Eye exam is current.  Patient does not see a podiatrist.  The patient exercises daily. Additional comments: Dry mouth nightly after increasing ozempic        Last edited by Lilian Fitzpatrick, CMA on 09/18/2023  1:14 PM.       Discussed the use of AI scribe software for clinical note transcription with the patient, who gave verbal consent to proceed.  History of Present Illness   The patient, with a history of diabetes and thyroid  disorder, presents with multiple concerns. They express difficulty in traveling with Ozempic , a medication used for their diabetes, due to its refrigeration requirements. They are considering skipping a week of the medication during an upcoming cruise.  The patient also reports recurring face pain, similar to a previous episode that was resolved with antibiotics. They suspect a possible tooth infection and plan to see a dentist for evaluation.  Additionally, the patient experienced symptoms suggestive of a urinary tract infection (UTI), including left kidney pain and discomfort around the belly button and bladder. However, a previous check revealed no UTI. The patient speculates they might have passed a small kidney stone.  The patient also reports widespread muscle pain and weakness, which they attribute to either the Ozempic  or their thyroid  disorder. They express a desire to add strength training to their current exercise regimen of walking 30 minutes five times a week.  The patient has started seeing a naturopathic doctor and has begun taking several new supplements, including amino acids, copper, estrogen, a muscle tension formula, and inositol. They express concern about potential interactions with their current medications and the need for  additional blood work to monitor their response to these supplements.         Medications: Outpatient Medications Prior to Visit  Medication Sig Note   ALPHA LIPOIC ACID PO Take by mouth.    Blood Glucose Monitoring Suppl (ONE TOUCH ULTRA SYSTEM KIT) W/DEVICE KIT GLUCOMETER TEST STRIPS - Historical Medication  Check blood sugar daily ---- ULTRA ONE TOUCH  Started 11-Jul-2009  Active Comments: DX: 790.29    COPPER IU by Intrauterine route.    Cranberry 500 MG CAPS Take 1 capsule by mouth daily.    DIGESTIVE ENZYMES PO Take 1 capsule by mouth daily.    hydrocortisone  2.5 % cream Apply to rash on left arm twice daily until improved.    levothyroxine  (SYNTHROID ) 25 MCG tablet TAKE 1 TABLET BY MOUTH DAILY BEFORE BREAKFAST AND 2 TABLETS BY MOUTH ON THE WEEKENDS    Semaglutide ,0.25 or 0.5MG /DOS, (OZEMPIC , 0.25 OR 0.5 MG/DOSE,) 2 MG/3ML SOPN Inject 0.5 mg into the skin once a week. 09/18/2023: [facility]   No facility-administered medications prior to visit.    Review of Systems     Objective    BP 139/86 (BP Location: Left Arm, Patient Position: Sitting, Cuff Size: Normal)   Pulse 85   Ht 5' 7 (1.702 m)   Wt 203 lb 9.6 oz (92.4 kg)   LMP 11/05/2000   SpO2 100%   BMI 31.89 kg/m    Physical Exam Vitals reviewed.  Constitutional:      General: She is not in acute distress.    Appearance: Normal appearance. She is well-developed. She is not diaphoretic.  HENT:     Head: Normocephalic and atraumatic.  Eyes:     General: No scleral icterus.    Conjunctiva/sclera: Conjunctivae normal.  Neck:     Thyroid : No thyromegaly.  Cardiovascular:     Rate and Rhythm: Normal rate and regular rhythm.     Heart sounds: Normal heart sounds. No murmur heard. Pulmonary:     Effort: Pulmonary effort is normal. No respiratory distress.     Breath sounds: Normal breath sounds. No wheezing, rhonchi or rales.  Musculoskeletal:     Cervical back: Neck supple.     Right lower leg: No edema.     Left lower leg: No edema.  Lymphadenopathy:     Cervical: No cervical adenopathy.  Skin:    General: Skin is warm and dry.     Findings: No rash.  Neurological:     Mental Status: She is alert and oriented to person, place, and time. Mental status is at baseline.  Psychiatric:        Mood and Affect: Mood normal.        Behavior: Behavior normal.      No results  found for any visits on 09/18/23.  Assessment & Plan     Problem List Items Addressed This Visit       Cardiovascular and Mediastinum   Hypertension associated with diabetes (HCC) - Primary   Well controlled Continue lifestyle modifications Recheck metabolic panel F/u in 6 months       Relevant Orders   Comprehensive metabolic panel     Digestive   NASH (nonalcoholic steatohepatitis)   Continue to monitor LFTs        Endocrine   Hyperlipidemia associated with type 2 diabetes mellitus (HCC)   Seeing a naturopathic doctor for cholesterol management and is on various supplements. No specific issues discussed during this visit. - Order cholesterol test  Relevant Orders   Lipid Panel With LDL/HDL Ratio   Diabetes (HCC)   On Ozempic  0.5 mg. Discussed stability of Ozempic  for travel and options for maintaining medication temperature. Reports dry mouth as a side effect. Blood sugar levels are being monitored. Discussed the option to skip a week of Ozempic  during travel without significant impact. - Continue Ozempic  0.5 mg - Discuss travel options for Ozempic  including shelf stability and travel coolers - Suggest sour foods or over-the-counter dry mouth drops for dry mouth - Order A1c test - Order yearly urine test for protein      Relevant Orders   Hemoglobin A1c   Hypothyroidism   On Synthroid  25 mcg daily with an 2 pills daily on weekends. Reports feeling better on the higher dose. Symptoms include fatigue, muscle pain, and widespread pain. Last thyroid  check was six months ago. Discussed the importance of maintaining a narrow therapeutic window for Synthroid . - Order thyroid  function test - Continue Synthroid  25 mcg daily with increased dose on weekends      Relevant Orders   TSH   Microalbuminuria due to type 2 diabetes mellitus (HCC)   Declines ACE/ARB in the past Will recheck UACR annually      Relevant Orders   Urine microalbumin-creatinine with uACR      Nervous and Auditory   Trigeminal neuralgia        Muscle Cramps Reports severe leg cramps, possibly related to Ozempic . Currently taking a muscle tension formula with magnesium and a separate magnesium supplement. Discussed the potential role of magnesium in alleviating cramps. - Continue current muscle tension formula and magnesium supplement  Trigeminal Neuralgia Reports recurrence of face pain, possibly related to a tooth infection. Scheduled to see a dentist next Friday. No issues with trigeminal neuralgia since discontinuing carbamazepine . - Follow up with dentist for possible tooth infection  Kidney Stones Reports symptoms suggestive of passing a small kidney stone recently. Symptoms have resolved. - Monitor for recurrence of symptoms  General Health Maintenance Due for a pneumonia shot update. Discussed the timing of the new pneumonia shot and the shingles vaccine. Engaging in regular physical activity and has increased walking duration. Discussed the benefits of strength training for postmenopausal women. - Administer new pneumonia shot after turning 63 - Consider shingles vaccine - Encourage strength training exercises  Follow-up - Schedule physical in six months - Send to lab for blood tests and urine sample.       Return in about 6 months (around 03/17/2024) for CPE.       Jon Eva, MD  [facility] [phone] (phone) [phone] (fax)  [facility]

## 2023-09-18 NOTE — Assessment & Plan Note (Signed)
Continue to monitor LFTs 

## 2023-09-19 ENCOUNTER — Encounter: Payer: Self-pay | Admitting: Family Medicine

## 2023-09-19 LAB — TSH: TSH: 1.24 u[IU]/mL (ref 0.450–4.500)

## 2023-09-19 LAB — LIPID PANEL WITH LDL/HDL RATIO
Cholesterol, Total: 219 mg/dL — ABNORMAL HIGH (ref 100–199)
HDL: 35 mg/dL — ABNORMAL LOW (ref >39)
LDL Chol Calc (NIH): 124 mg/dL — ABNORMAL HIGH (ref 0–99)
LDL/HDL Ratio: 3.5 {ratio} — ABNORMAL HIGH (ref 0.0–3.2)
Triglycerides: 341 mg/dL — ABNORMAL HIGH (ref 0–149)
VLDL Cholesterol Cal: 60 mg/dL — ABNORMAL HIGH (ref 5–40)

## 2023-09-19 LAB — COMPREHENSIVE METABOLIC PANEL
ALT: 30 [IU]/L (ref 0–32)
AST: 28 [IU]/L (ref 0–40)
Albumin: 4.7 g/dL (ref 3.9–4.9)
Alkaline Phosphatase: 90 [IU]/L (ref 44–121)
BUN/Creatinine Ratio: 21 (ref 12–28)
BUN: 15 mg/dL (ref 8–27)
Bilirubin Total: 0.2 mg/dL (ref 0.0–1.2)
CO2: 22 mmol/L (ref 20–29)
Calcium: 9.3 mg/dL (ref 8.7–10.3)
Chloride: 104 mmol/L (ref 96–106)
Creatinine, Ser: 0.71 mg/dL (ref 0.57–1.00)
Globulin, Total: 1.9 g/dL (ref 1.5–4.5)
Glucose: 110 mg/dL — ABNORMAL HIGH (ref 70–99)
Potassium: 4.9 mmol/L (ref 3.5–5.2)
Sodium: 144 mmol/L (ref 134–144)
Total Protein: 6.6 g/dL (ref 6.0–8.5)
eGFR: 95 mL/min/{1.73_m2} (ref >59)

## 2023-09-19 LAB — MICROALBUMIN / CREATININE URINE RATIO
Creatinine, Urine: 57.2 mg/dL
Microalb/Creat Ratio: <5 mg/g{creat} (ref 0–29)
Microalbumin, Urine: <3 ug/mL

## 2023-09-19 LAB — HEMOGLOBIN A1C
Est. average glucose Bld gHb Est-mCnc: 117 mg/dL
Hgb A1c MFr Bld: 5.7 % — ABNORMAL HIGH (ref 4.8–5.6)

## 2023-09-23 ENCOUNTER — Other Ambulatory Visit: Payer: Self-pay | Admitting: Family Medicine

## 2023-09-25 ENCOUNTER — Telehealth: Payer: Self-pay | Admitting: Family Medicine

## 2023-09-25 NOTE — Telephone Encounter (Signed)
 [facility] is requesting prescription refill Semaglutide,0.25 or 0.5MG /DOS, (OZEMPIC, 0.25 OR 0.5 MG/DOSE,) 2 MG/3ML SOPN   Please advise

## 2023-09-25 NOTE — Telephone Encounter (Signed)
 Requested Prescriptions  Pending Prescriptions Disp Refills   OZEMPIC , 0.25 OR 0.5 MG/DOSE, 2 MG/3ML SOPN [Pharmacy Med Name: OZEMPIC  0.25-0.5MG  DOSE PEN(2MG /3ML)[R^]] 9 mL 1    Sig: INJECT 0.5MG  UNDER THE SKIN EVERY WEEK     Endocrinology:  Diabetes - GLP-1 Receptor Agonists - semaglutide  Failed - 09/25/2023  4:57 PM      Failed - HBA1C in normal range and within 180 days    Hgb A1c MFr Bld  Date Value Ref Range Status  09/18/2023 5.7 (H) 4.8 - 5.6 % Final    Comment:             Prediabetes: 5.7 - 6.4          Diabetes: >6.4          Glycemic control for adults with diabetes: <7.0          Passed - Cr in normal range and within 360 days    Creat  Date Value Ref Range Status  08/04/2017 0.71 0.50 - 1.05 mg/dL Final    Comment:    For patients >25 years of age, the reference limit for Creatinine is approximately 13% higher for people identified as African-American. .    Creatinine, Ser  Date Value Ref Range Status  09/18/2023 0.71 0.57 - 1.00 mg/dL Final   Creatinine, POC  Date Value Ref Range Status  08/03/2018 NA mg/dL Final         Passed - Valid encounter within last 6 months    Recent Outpatient Visits           1 week ago Hypertension associated with diabetes [facility])   [place] [facility] [place], Jon HERO, MD   1 month ago Dysuria   [facility] [place] Nancyann BRAVO, MD   4 months ago Acute non-recurrent pansinusitis   [facility] [place], Jon HERO, MD   6 months ago Encounter for annual physical exam   [facility] [place], Jon HERO, MD   7 months ago Palpitations   [facility] [place], Jon HERO, MD       Future Appointments             In 2 weeks Jackquline Sawyer, MD [facility]   In 6 months Bacigalupo, Jon HERO, MD [facility]

## 2023-09-26 MED ORDER — OZEMPIC (0.25 OR 0.5 MG/DOSE) 2 MG/3ML ~~LOC~~ SOPN: 0.5000 mg | PEN_INJECTOR | SUBCUTANEOUS | 1 refills | Status: DC

## 2023-10-01 DIAGNOSIS — M7541 Impingement syndrome of right shoulder: Secondary | ICD-10-CM | POA: Diagnosis not present

## 2023-10-14 ENCOUNTER — Ambulatory Visit: Payer: BC Managed Care – PPO | Admitting: Dermatology

## 2023-10-14 DIAGNOSIS — L988 Other specified disorders of the skin and subcutaneous tissue: Secondary | ICD-10-CM

## 2023-10-14 DIAGNOSIS — D224 Melanocytic nevi of scalp and neck: Secondary | ICD-10-CM | POA: Diagnosis not present

## 2023-10-14 DIAGNOSIS — Z86018 Personal history of other benign neoplasm: Secondary | ICD-10-CM

## 2023-10-14 DIAGNOSIS — L820 Inflamed seborrheic keratosis: Secondary | ICD-10-CM | POA: Diagnosis not present

## 2023-10-14 DIAGNOSIS — L578 Other skin changes due to chronic exposure to nonionizing radiation: Secondary | ICD-10-CM

## 2023-10-14 DIAGNOSIS — D2272 Melanocytic nevi of left lower limb, including hip: Secondary | ICD-10-CM

## 2023-10-14 DIAGNOSIS — L720 Epidermal cyst: Secondary | ICD-10-CM

## 2023-10-14 DIAGNOSIS — R238 Other skin changes: Secondary | ICD-10-CM

## 2023-10-14 DIAGNOSIS — L814 Other melanin hyperpigmentation: Secondary | ICD-10-CM

## 2023-10-14 DIAGNOSIS — W908XXA Exposure to other nonionizing radiation, initial encounter: Secondary | ICD-10-CM | POA: Diagnosis not present

## 2023-10-14 DIAGNOSIS — D229 Melanocytic nevi, unspecified: Secondary | ICD-10-CM

## 2023-10-14 DIAGNOSIS — D225 Melanocytic nevi of trunk: Secondary | ICD-10-CM

## 2023-10-14 DIAGNOSIS — D2261 Melanocytic nevi of right upper limb, including shoulder: Secondary | ICD-10-CM

## 2023-10-14 DIAGNOSIS — L821 Other seborrheic keratosis: Secondary | ICD-10-CM

## 2023-10-14 DIAGNOSIS — D2262 Melanocytic nevi of left upper limb, including shoulder: Secondary | ICD-10-CM

## 2023-10-14 MED ORDER — TRETINOIN 0.05 % EX CREA: TOPICAL_CREAM | Freq: Every day | CUTANEOUS | 2 refills | Status: DC

## 2023-10-14 NOTE — Patient Instructions (Addendum)
Recommend daily broad spectrum sunscreen SPF 30+ to sun-exposed areas, reapply every 2 hours as needed. Call for new or changing lesions.  Staying in the shade or wearing long sleeves, sun glasses (UVA+UVB protection) and wide brim hats (4-inch brim around the entire circumference of the hat) are also recommended for sun protection.      Topical retinoid medications like tretinoin/Retin-A, adapalene/Differin, tazarotene/Fabior, and Epiduo/Epiduo Forte can cause dryness and irritation when first started. Only apply a pea-sized amount to the entire affected area. Avoid applying it around the eyes, edges of mouth and creases at the nose. If you experience irritation, use a good moisturizer first and/or apply the medicine less often. If you are doing well with the medicine, you can increase how often you use it until you are applying every night. Be careful with sun protection while using this medication as it can make you sensitive to the sun. This medicine should not be used by pregnant women.       Cryotherapy Aftercare  Wash gently with soap and water everyday.   Apply Vaseline and Band-Aid daily until healed.     Due to recent changes in healthcare laws, you may see results of your pathology and/or laboratory studies on MyChart before the doctors have had a chance to review them. We understand that in some cases there may be results that are confusing or concerning to you. Please understand that not all results are received at the same time and often the doctors may need to interpret multiple results in order to provide you with the best plan of care or course of treatment. Therefore, we ask that you please give [place] 2 business days to thoroughly review all your results before contacting the office for clarification. Should we see a critical lab result, you will be contacted sooner.   If You Need Anything After Your Visit  If you have any questions or concerns for your doctor, please call our  main line at [phone] and press option 4 to reach your doctor's medical assistant. If no one answers, please leave a voicemail as directed and we will return your call as soon as possible. Messages left after 4 pm will be answered the following business day.   You may also send [place] a message via MyChart. We typically respond to MyChart messages within 1-2 business days.  For prescription refills, please ask your pharmacy to contact our office. Our fax number is [phone].  If you have an urgent issue when the clinic is closed that cannot wait until the next business day, you can page your doctor at the number below.    Please note that while we do our best to be available for urgent issues outside of office hours, we are not available 24/7.   If you have an urgent issue and are unable to reach [place], you may choose to seek medical care at your doctor's office, retail clinic, urgent care center, or emergency room.  If you have a medical emergency, please immediately call 911 or go to the emergency department.  Pager Numbers  - Dr. Gwen Pounds: [phone]  - Dr. Roseanne Reno: [phone]  - Dr. Katrinka Blazing: [phone]   In the event of inclement weather, please call our main line at [phone] for an update on the status of any delays or closures.  Dermatology Medication Tips: Please keep the boxes that topical medications come in in order to help keep track of the instructions about where and how to use these. Pharmacies typically  print the medication instructions only on the boxes and not directly on the medication tubes.   If your medication is too expensive, please contact our office at [phone] option 4 or send [place] a message through MyChart.   We are unable to tell what your co-pay for medications will be in advance as this is different depending on your insurance coverage. However, we may be able to find a substitute medication at lower cost or fill out paperwork to get insurance to  cover a needed medication.   If a prior authorization is required to get your medication covered by your insurance company, please allow [place] 1-2 business days to complete this process.  Drug prices often vary depending on where the prescription is filled and some pharmacies may offer cheaper prices.  The website www.goodrx.com contains coupons for medications through different pharmacies. The prices here do not account for what the cost may be with help from insurance (it may be cheaper with your insurance), but the website can give you the price if you did not use any insurance.  - You can print the associated coupon and take it with your prescription to the pharmacy.  - You may also stop by our office during regular business hours and pick up a GoodRx coupon card.  - If you need your prescription sent electronically to a different pharmacy, notify our office through [facility] or by phone at [phone] option 4.     Si Usted Necesita Algo Despus de Su Visita  Tambin puede enviarnos un mensaje a travs de Clinical cytogeneticist. Por lo general respondemos a los mensajes de MyChart en el transcurso de 1 a 2 das hbiles.  Para renovar recetas, por favor pida a su farmacia que se ponga en contacto con nuestra oficina. Annie Sable de fax es [place] [phone].  Si tiene un asunto urgente cuando la clnica est cerrada y que no puede esperar hasta el siguiente da hbil, puede llamar/localizar a su doctor(a) al nmero que aparece a continuacin.   Por favor, tenga en cuenta que aunque hacemos todo lo posible para estar disponibles para asuntos urgentes fuera del horario de [place], no estamos disponibles las 24 horas del da, los 7 [address]  [address] de la [employer].   Si tiene un problema urgente y no puede comunicarse con nosotros, puede optar por buscar atencin mdica  en el consultorio de su doctor(a), en una clnica privada, en un centro de atencin urgente o en una sala de emergencias.  Si tiene Wellsite geologist, por favor llame inmediatamente al 911 o vaya a la sala de emergencias.  Nmeros de bper  - Dr. Gwen Pounds: [phone]  - Dra. Roseanne Reno: [phone]  - Dr. Katrinka Blazing: [phone]   En caso de inclemencias del tiempo, por favor llame a Lacy Duverney principal al [phone] para una actualizacin sobre el [place] de cualquier retraso o cierre.  Consejos para la medicacin en dermatologa: Por favor, guarde las cajas en las que vienen los medicamentos de uso tpico para ayudarle a seguir las instrucciones sobre dnde y cmo usarlos. Las farmacias generalmente imprimen las instrucciones del medicamento slo en las cajas y no directamente en los tubos del [place].   Si su medicamento es muy caro, por favor, pngase en contacto con Rolm Gala llamando al [phone] y presione la opcin 4 o envenos un mensaje a travs de Clinical cytogeneticist.   No podemos decirle cul ser su copago por los medicamentos por adelantado ya que esto es diferente dependiendo de la [place] de  su seguro. Sin embargo, es posible que podamos encontrar un medicamento sustituto a Audiological scientist un formulario para que el seguro cubra el medicamento que se considera necesario.   Si se requiere una autorizacin previa para que su compaa de seguros [place] su medicamento, por favor permtanos de 1 a 2 das hbiles para completar [address]  Los precios de los medicamentos varan con frecuencia dependiendo del Environmental consultant de dnde se surte la receta y alguna farmacias pueden ofrecer precios ms baratos.  El sitio web www.goodrx.com tiene cupones para medicamentos de Health and safety inspector. Los precios aqu no tienen en cuenta lo que podra costar con la ayuda del seguro (puede ser ms barato con su seguro), pero el sitio web puede darle el precio si no utiliz Tourist information centre manager.  - Puede imprimir el cupn correspondiente y llevarlo con su receta a la farmacia.  - Tambin puede pasar por nuestra oficina durante el  horario de atencin regular y Education officer, museum una tarjeta de cupones de GoodRx.  - Si necesita que su receta se enve electrnicamente a una farmacia diferente, informe a nuestra oficina a travs de MyChart de [place] o por telfono llamando al [phone] y presione la opcin 4.

## 2023-10-14 NOTE — Progress Notes (Signed)
Follow-Up Visit   Subjective  Veronica Butler is a 63 y.o. female who presents for the following: 6 months f/u  recheck marked nevi, patient report no known changes. Patient c/o irritated spots on her neck she would like removed today. Patient would like a rx for fine line and wrinkles on her face    The following portions of the chart were reviewed this encounter and updated as appropriate: medications, allergies, medical history  Review of Systems:  No other skin or systemic complaints except as noted in HPI or Assessment and Plan.  Objective  Well appearing patient in no apparent distress; mood and affect are within normal limits.   examination was performed on scalp, chest, back,hands,neck,ears  Relevant physical exam findings are noted in the Assessment and Plan.  left frontal scalp x 1, neck x 16 (17) Stuck-on, waxy, tan-brown papules --Discussed benign etiology and prognosis.   Assessment & Plan    ACTINIC DAMAGE - Chronic condition, secondary to cumulative UV/sun exposure - diffuse scaly erythematous macules with underlying dyspigmentation - Recommend daily broad spectrum sunscreen SPF 30+ to sun-exposed areas, reapply every 2 hours as needed.  - Staying in the shade or wearing long sleeves, sun glasses (UVA+UVB protection) and wide brim hats (4-inch brim around the entire circumference of the hat) are also recommended for sun protection.  - Call for new or changing lesions.   MELANOCYTIC NEVI - Tan-brown and/or pink-flesh-colored symmetric macules and papules, new photos of back today   - 4 mm medium dark brown macule slight irregular border. Left lateral breast. Photo compared, no change   - 3 mm brown macule. Left foot dorsum, photo compared (03/14/21) no change   - 5 mm gray brown macule, slightly waxy. Left dorsal hand. Blue nevus vs SK, Stable   - 5 x 4 mm brown macule, darker center. Left spinal upper back, photo compared (07/24/20), no change   - 2 mm med  dark brown macule, right lateral breast.   - 4 x 3 mm med dark brown macule L med clavicle   - 4 mm flesh papule with a tan center at the right hand dorsal    - 4 mm med brown macule R postauricular   - Benign appearing on exam today - Observation - Call clinic for new or changing moles - Recommend daily use of broad spectrum spf 30+ sunscreen to sun-exposed areas.    SK vs Lentigo vs nevus - Stable Exam:  Right Ear Concha  4.0 mm speckled brown macule with irregular border- pt states present for years no changes, photo compared from previous visit, some lightening noted   Treatment Plan:   Benign-appearing.  Observation.  Call clinic for new or changing lesions.  Recommend daily use of broad spectrum spf 30+ sunscreen to sun-exposed areas.     Milia vrs other cyst - 2 mm flesh-white papule. Left lower eyelid  - type of cyst - benign - may be extracted if symptomatic - observe   History of Dysplastic Nevi, multiple sites Light brown macule within scar of the left top of shoulder (mild dysplastic nevus). - No evidence of recurrence today - Recommend regular full body skin exams - Recommend daily broad spectrum sunscreen SPF 30+ to sun-exposed areas, reapply every 2 hours as needed.  - Call if any new or changing lesions are noted between office visits   INFLAMED SEBORRHEIC KERATOSIS (17) left frontal scalp x 1, neck x 16 (17) Symptomatic, irritating, patient would like treated.  Destruction of lesion - left frontal scalp x 1, neck x 16 (17)  Destruction method: cryotherapy   Informed consent: discussed and consent obtained   Lesion destroyed using liquid nitrogen: Yes   Region frozen until ice ball extended beyond lesion: Yes   Outcome: patient tolerated procedure well with no complications   Post-procedure details: wound care instructions given   Additional details:  Prior to procedure, discussed risks of blister formation, small wound, skin dyspigmentation, or rare  scar following cryotherapy. Recommend Vaseline ointment to treated areas while healing.   FACIAL ELASTOSIS Exam: Rhytides and volume loss.  Treatment Plan: Start Tretinoin 0.05% cream apply to face at bedtime   Topical retinoid medications like tretinoin/Retin-A, adapalene/Differin, tazarotene/Fabior, and Epiduo/Epiduo Forte can cause dryness and irritation when first started. Only apply a pea-sized amount to the entire affected area. Avoid applying it around the eyes, edges of mouth and creases at the nose. If you experience irritation, use a good moisturizer first and/or apply the medicine less often. If you are doing well with the medicine, you can increase how often you use it until you are applying every night. Be careful with sun protection while using this medication as it can make you sensitive to the sun. This medicine should not be used by pregnant women.    Recommend daily broad spectrum sunscreen SPF 30+ to sun-exposed areas, reapply every 2 hours as needed. Call for new or changing lesions.  Staying in the shade or wearing long sleeves, sun glasses (UVA+UVB protection) and wide brim hats (4-inch brim around the entire circumference of the hat) are also recommended for sun protection.    Discussed Elastin restorative eye treatment qd-bid   Return in about 6 months (around 04/12/2024) for hx of Dyplastic nevus , TBSE.  I, Angelique Holm, CMA, am acting as scribe for Willeen Niece, MD .   Documentation: I have reviewed the above documentation for accuracy and completeness, and I agree with the above.  Willeen Niece, MD

## 2023-10-15 DIAGNOSIS — M25511 Pain in right shoulder: Secondary | ICD-10-CM | POA: Diagnosis not present

## 2023-10-18 ENCOUNTER — Encounter: Payer: Self-pay | Admitting: Family Medicine

## 2023-10-21 DIAGNOSIS — G4733 Obstructive sleep apnea (adult) (pediatric): Secondary | ICD-10-CM | POA: Diagnosis not present

## 2023-10-21 DIAGNOSIS — R252 Cramp and spasm: Secondary | ICD-10-CM | POA: Diagnosis not present

## 2023-10-21 DIAGNOSIS — G500 Trigeminal neuralgia: Secondary | ICD-10-CM | POA: Diagnosis not present

## 2023-10-24 DIAGNOSIS — R252 Cramp and spasm: Secondary | ICD-10-CM | POA: Diagnosis not present

## 2023-10-27 ENCOUNTER — Ambulatory Visit: Payer: Self-pay | Admitting: *Deleted

## 2023-10-27 NOTE — Telephone Encounter (Signed)
 Message from Veronica Butler sent at 10/27/2023 12:27 PM EST  Summary: dizzness, fatigue   Patient states that she has been feeling dizziness and fatigue for 3 days.          Call History  Contact Date/Time Type Contact Phone/Fax By  10/27/2023 12:27 PM EST Phone (Incoming) Hydi, Batch (Self) [phone] Marvina Slough) Mabe, Danielle E   Reason for Disposition  Taking a medicine that could cause dizziness (e.g., blood pressure medications, diuretics)  Answer Assessment - Initial Assessment Questions 1. DESCRIPTION: "Describe your dizziness."     I'm having dizziness and fatigue.    I've been in touch with Dr. Geraldene Kleine via MyChart.   I'm having severe leg cramps nightly.   She had me reduce my Ozempic  to 0.25 mg on Thur. I had a yearly follow up with my neurologist.    He is checking for heavy metals in my urine.   My blood test AG ratio is low    IFG is off.   It indicated dehydration.   I feel dehydrated.    I had to void 16 times during the urine test for 24 hours.    I started feeling shaky on Friday and weak.  I got some Gator Aid.   I'm feeling light headed and shaky.   My BP is ok.   I've not checked my glucose but once since reducing my Ozempic  to 0.25 mg.     On Fri fasting  it  was 111.    2. LIGHTHEADED: "Do you feel lightheaded?" (e.g., somewhat faint, woozy, weak upon standing)     Yes   I'm feeling shaky and dizzy now.    I had her check her glucose while on the phone.     It was 178 now.    I just had a smoothie and velveeta biscuits and Gator Aid during the last hour.    3. VERTIGO: "Do you feel like either you or the room is spinning or tilting?" (i.e. vertigo)     Not asked 4. SEVERITY: "How bad is it?"  "Do you feel like you are going to faint?" "Can you stand and walk?"   - MILD: Feels slightly dizzy, but walking normally.   - MODERATE: Feels unsteady when walking, but not falling; interferes with normal activities (e.g., school, work).   - SEVERE: Unable to walk without falling, or  requires assistance to walk without falling; feels like passing out now.      No fever.    I'm feeling dizzy and lightheaded all time.    5. ONSET:  "When did the dizziness begin?"     This has been for a few days.    6. AGGRAVATING FACTORS: "Does anything make it worse?" (e.g., standing, change in head position)     It's not getting better.    My legs have been weak for a while.    My arms are feeling weak and my hands and feet are feeling so cold during the last week.     7. HEART RATE: "Can you tell me your heart rate?" "How many beats in 15 seconds?"  (Note: not all patients can do this)       Not asked 8. CAUSE: "What do you think is causing the dizziness?"     I've been on Ozempic  for several months now.  The leg cramps are better since I started the [employer].    No vomiting or diarrhea or recent illnesses.  I was having constipation from the Ozempic .   I'm using Miralax.   9. RECURRENT SYMPTOM: "Have you had dizziness before?" If Yes, ask: "When was the last time?" "What happened that time?"     Not asked 10. OTHER SYMPTOMS: "Do you have any other symptoms?" (e.g., fever, chest pain, vomiting, diarrhea, bleeding)       Weak, leg cramps,  11. PREGNANCY: "Is there any chance you are pregnant?" "When was your last menstrual period?"       N/A due age  Protocols used: Dizziness - Lightheadedness-A-AH

## 2023-10-27 NOTE — Telephone Encounter (Signed)
  Chief Complaint: dizziness and fatigue.   She recently reduced her Ozempic  dose to 0.25 mg per Dr. Phillis Breaker instructions due to severe leg cramps. Symptoms: Dizzy and lightheaded for several days now.   Frequency: All the time Pertinent Negatives: Patient denies fever or recent illnesses.    Disposition: [] ED /[] Urgent Care (no appt availability in office) / [x] Appointment(In office/virtual)/ []  [facility]/ [] Home Care/ [] Refused Recommended Disposition /[] [facility]/ [x]  Follow-up with PCP Additional Notes: High priority message sent to Dr. David Escort to see if she could be worked in.  Next appt with Dr. David Escort isn't until Feb. 18.   Pt. Agreeable to someone calling her back regarding being worked in to see Dr. David Escort.   Prefers to see Dr. David Escort since she knows what is going on with me.   We are dealing with several issues.

## 2023-10-27 NOTE — Telephone Encounter (Signed)
 There are openings on Thursday that can be used.  I think the 840 hold can be released also.

## 2023-10-28 NOTE — Telephone Encounter (Signed)
Left vm for pt to call back to schedule an appt.  Okay for [facility] to schedule in Dr. Senaida Lange same day appointment slots per Dr. Leonard Schwartz.

## 2023-10-30 ENCOUNTER — Ambulatory Visit: Payer: BC Managed Care – PPO | Admitting: Family Medicine

## 2023-10-30 ENCOUNTER — Encounter: Payer: Self-pay | Admitting: Family Medicine

## 2023-10-30 VITALS — BP 118/75 | HR 94 | Ht 66.0 in | Wt 192.8 lb

## 2023-10-30 DIAGNOSIS — E039 Hypothyroidism, unspecified: Secondary | ICD-10-CM | POA: Diagnosis not present

## 2023-10-30 DIAGNOSIS — Z7985 Long-term (current) use of injectable non-insulin antidiabetic drugs: Secondary | ICD-10-CM

## 2023-10-30 DIAGNOSIS — E1159 Type 2 diabetes mellitus with other circulatory complications: Secondary | ICD-10-CM | POA: Diagnosis not present

## 2023-10-30 DIAGNOSIS — E1142 Type 2 diabetes mellitus with diabetic polyneuropathy: Secondary | ICD-10-CM | POA: Diagnosis not present

## 2023-10-30 DIAGNOSIS — R42 Dizziness and giddiness: Secondary | ICD-10-CM | POA: Diagnosis not present

## 2023-10-30 DIAGNOSIS — I152 Hypertension secondary to endocrine disorders: Secondary | ICD-10-CM

## 2023-10-30 DIAGNOSIS — R682 Dry mouth, unspecified: Secondary | ICD-10-CM

## 2023-10-30 NOTE — Progress Notes (Signed)
Acute visit   Patient: Veronica Butler   DOB: [birth year]   63 y.o. Female  MRN: [id number]  Chief Complaint  Patient presents with   Dizziness    Dizziness X 6 days. Reports mainly in the mornings and around lunch starts feeling better and sometimes returns at night. Associated with fatigue X few weeks.Reports it varies in how extreme her lightheadedness is. Husband checked oxygen and pulse as well as BP and it was 110/ 60 but did not check blood sugar after last episode when getting out of the shower.    Immunizations    Pneumonia Vaccine - Pt reports she was advised previously to receive in July Shingles - Declined   Subjective    Discussed the use of AI scribe software for clinical note transcription with the patient, who gave verbal consent to proceed.  History of Present Illness   The patient, with a history of diabetes, presents with a chief complaint of lightheadedness for about six days. She describes the sensation as feeling like she is going to faint, with episodes of visual disturbances described as a "black curtain" closing in. The patient also reports leg weakness and imbalance, often bumping into walls at home. She describes severe leg cramps that disrupt her sleep and contribute to her overall fatigue.  The patient has been monitoring her blood pressure and blood sugar at home. She reports some readings on the lower side, with blood pressure as low as 110/60 and blood sugar readings ranging from 88 to 178. She has not experienced any fever or other signs of infection.  The patient has been trying to manage her symptoms with diet and hydration. She reports a dry mouth and frequent urination, which she believes might be signs of dehydration. She has been drinking 4-5 large containers of water daily and has tried rehydration with Gatorade. Despite these efforts, she reports a persistent dry mouth and dark urine in the mornings and at night.  The patient has lost some weight  and has been trying to manage her diabetes with diet and hydration. She reports a reduced appetite, possibly due to her medication, Ozempic. She has been trying to maintain a healthy diet with salads, smoothies, apples, and pistachios, but she acknowledges that her protein intake might be insufficient.  The patient has been walking daily for 20 minutes but had to reduce the duration from 30 minutes due to shin splints. She reports that her symptoms of lightheadedness and leg weakness have been manageable on some days but have been severe on others, requiring her to lie down.       Review of Systems    Objective    BP 118/75 Comment: 1:00 pm home reading  Pulse 94   Ht 5\' 6"  (1.676 m)   Wt 192 lb 12.8 oz (87.5 kg)   LMP 11/05/2000   SpO2 100%   BMI 31.12 kg/m    Orthostatic Vitals for the past 48 hrs (Last 6 readings):  Patient Position BP Pulse BP Location Cuff Size Patient Position (if appropriate) BP- Standing at 0 minutes Pulse- Standing at 0 minutes BP- Sitting Pulse- Sitting BP- Lying Pulse- Lying  10/30/23 1401 Sitting (!) 137/96 94 Left Arm Large -- -- -- -- -- -- --  10/30/23 1403 -- 130/76 -- -- -- -- -- -- -- -- -- --  10/30/23 1404 -- 118/75 -- -- -- -- -- -- -- -- -- --  10/30/23 1449 -- -- -- -- --  Orthostatic Vitals (!) 129/92 90 (!) 136/91 79 135/87 75    Physical Exam Vitals reviewed.  Constitutional:      General: She is not in acute distress.    Appearance: Normal appearance. She is well-developed. She is not diaphoretic.  HENT:     Head: Normocephalic and atraumatic.  Eyes:     General: No scleral icterus.    Conjunctiva/sclera: Conjunctivae normal.  Neck:     Thyroid: No thyromegaly.  Cardiovascular:     Rate and Rhythm: Normal rate and regular rhythm.     Heart sounds: Normal heart sounds. No murmur heard. Pulmonary:     Effort: Pulmonary effort is normal. No respiratory distress.     Breath sounds: Normal breath sounds. No wheezing, rhonchi or rales.   Musculoskeletal:     Cervical back: Neck supple.     Right lower leg: No edema.     Left lower leg: No edema.  Lymphadenopathy:     Cervical: No cervical adenopathy.  Skin:    General: Skin is warm and dry.     Findings: No rash.  Neurological:     Mental Status: She is alert and oriented to person, place, and time. Mental status is at baseline.  Psychiatric:        Mood and Affect: Mood normal.        Behavior: Behavior normal.       No results found for any visits on 10/30/23.  Assessment & Plan     Problem List Items Addressed This Visit       Cardiovascular and Mediastinum   Hypertension associated with diabetes (HCC)   Relevant Orders   Amb ref to Medical Nutrition Therapy-MNT     Endocrine   Diabetes (HCC)   Relevant Orders   Amb ref to Medical Nutrition Therapy-MNT   Hypothyroidism   Other Visit Diagnoses       Lightheadedness    -  Primary     Dry mouth               Lightheadedness and Weakness Lightheadedness and weakness for six days, with episodes of near-syncope, particularly post-shower. Blood pressure mostly in the 110-120s, occasional spikes to 130s, one episode of 148. Blood sugar levels: 178 postprandial, 104, 118, and 88 fasting. Symptoms may be related to dehydration, malnutrition, or blood pressure fluctuations. Differential includes orthostatic hypotension and hypoglycemia. Discussed risks of continuing Ozempic, including symptom exacerbation and inadequate caloric intake. Discontinuing Ozempic may alleviate symptoms and improve nutritional status. Discussed alternative diabetes management strategies and importance of balanced nutrition and hydration. - Check orthostatic blood pressures (laying, sitting, standing) - Discontinue Ozempic - Refer to dietitian for meal planning and nutritional support - Increase protein and caloric intake - Hydrate well with water  Leg Cramps and Weakness Severe leg cramps and weakness, possibly related to  dehydration, malnutrition, or medication side effects. Neurologist has scheduled a nerve conduction study and 24-hour urinalysis to check for neuropathy and heavy metals. Awaiting results. Discussed impact of nutritional deficiencies and dehydration on muscle function. - Await results of nerve conduction study and 24-hour urinalysis - Increase hydration and nutritional intake  Diabetes Mellitus Diabetes managed with Ozempic, currently on 0.25 mg. Blood sugar levels within normal range but experiencing symptoms potentially related to low caloric intake and dehydration. A1c at 5.7. Considering discontinuation of Ozempic due to adverse effects and exploring alternative management strategies. Discussed potential benefits of discontinuing Ozempic, including improved nutritional intake and reduced adverse effects. Alternative medications,  such as Mounjaro, may be considered if needed. - Discontinue Ozempic - Refer to dietitian for diabetes management and nutritional support - Monitor blood sugar levels - Follow up in 1 month to reassess diabetes management  Hypothyroidism Hypothyroidism managed with levothyroxine. Recent TSH levels have decreased since January. Switched to generic levothyroxine due to insurance. No significant difference expected between brand and generic. Current symptoms not attributed to thyroid function. Discussed importance of consistent medication timing and potential interactions with other medications. - Continue levothyroxine - Monitor thyroid function regularly  Dry Mouth Persistent dry mouth, previously discussed in January. Symptoms improved with increased hydration using rapid rehydration Gatorade. Considering dehydration as a contributing factor. Discussed benefits of adequate water intake and risks of excessive electrolyte intake from Gatorade. - Increase water intake - Discontinue Gatorade if adequate hydration is achieved with water  General Health Maintenance Making  lifestyle changes including daily walki ng and considering strength training. Discussed importance of balanced nutrition and hydration. Emphasized need for adequate protein and caloric intake to support overall health and prevent symptoms. - Continue daily walking - Sign up for strength training at [facility] - Maintain balanced diet with adequate protein and calories  Follow-up - Follow up in 1 month to reassess symptoms and management - Nurse to check orthostatic blood pressures today.       No orders of the defined types were placed in this encounter.    Return in about 4 weeks (around 11/27/2023).      Total time spent on today's visit was greater than 40 minutes, including both face-to-face time and nonface-to-face time personally spent on review of chart (labs and imaging), discussing further work-up, treatment options, referrals to specialist, reviewing outside records, answering patient's questions, and coordinating care.   Shirlee Latch, MD  [facility] [phone] (phone) [phone] (fax)  [facility]

## 2023-11-04 DIAGNOSIS — M25511 Pain in right shoulder: Secondary | ICD-10-CM | POA: Diagnosis not present

## 2023-11-07 DIAGNOSIS — M5416 Radiculopathy, lumbar region: Secondary | ICD-10-CM | POA: Diagnosis not present

## 2023-11-17 DIAGNOSIS — H5203 Hypermetropia, bilateral: Secondary | ICD-10-CM | POA: Diagnosis not present

## 2023-11-17 DIAGNOSIS — H25043 Posterior subcapsular polar age-related cataract, bilateral: Secondary | ICD-10-CM | POA: Diagnosis not present

## 2023-11-17 DIAGNOSIS — H52223 Regular astigmatism, bilateral: Secondary | ICD-10-CM | POA: Diagnosis not present

## 2023-11-17 DIAGNOSIS — H524 Presbyopia: Secondary | ICD-10-CM | POA: Diagnosis not present

## 2023-11-17 DIAGNOSIS — E119 Type 2 diabetes mellitus without complications: Secondary | ICD-10-CM | POA: Diagnosis not present

## 2023-11-17 LAB — HM DIABETES EYE EXAM

## 2023-11-18 DIAGNOSIS — M5451 Vertebrogenic low back pain: Secondary | ICD-10-CM | POA: Diagnosis not present

## 2023-11-19 DIAGNOSIS — R200 Anesthesia of skin: Secondary | ICD-10-CM | POA: Diagnosis not present

## 2023-11-19 DIAGNOSIS — R531 Weakness: Secondary | ICD-10-CM | POA: Diagnosis not present

## 2023-11-25 DIAGNOSIS — M5451 Vertebrogenic low back pain: Secondary | ICD-10-CM | POA: Diagnosis not present

## 2023-11-27 ENCOUNTER — Encounter: Payer: Self-pay | Admitting: Family Medicine

## 2023-11-27 ENCOUNTER — Ambulatory Visit: Payer: BC Managed Care – PPO | Admitting: Family Medicine

## 2023-11-27 VITALS — BP 116/86 | HR 87 | Ht 67.0 in | Wt 190.2 lb

## 2023-11-27 DIAGNOSIS — R399 Unspecified symptoms and signs involving the genitourinary system: Secondary | ICD-10-CM | POA: Diagnosis not present

## 2023-11-27 DIAGNOSIS — E1142 Type 2 diabetes mellitus with diabetic polyneuropathy: Secondary | ICD-10-CM

## 2023-11-27 DIAGNOSIS — E039 Hypothyroidism, unspecified: Secondary | ICD-10-CM | POA: Diagnosis not present

## 2023-11-27 DIAGNOSIS — J301 Allergic rhinitis due to pollen: Secondary | ICD-10-CM

## 2023-11-27 DIAGNOSIS — R42 Dizziness and giddiness: Secondary | ICD-10-CM

## 2023-11-27 MED ORDER — FLUCONAZOLE 150 MG PO TABS: 150.0000 mg | ORAL_TABLET | Freq: Once | ORAL | 0 refills | Status: AC

## 2023-11-27 NOTE — Progress Notes (Signed)
 Established patient visit   Patient: Veronica Butler   DOB: 1961/05/20   63 y.o. Female  MRN: [id number] Visit Date: 11/27/2023  Today's healthcare provider: Shirlee Latch, MD   Chief Complaint  Patient presents with   Medical Management of Chronic Issues   Diabetes    Patient last administered injection on 10/30/23. Fasting blood sugars on average 112-125. Patient reports she does not ever feel episodes of hyperglycemia. Reports appetite is coming back and feeling good. Appt with dietician on 3/26   Subjective    Diabetes   HPI     Diabetes    Additional comments: Patient last administered injection on 10/30/23. Fasting blood sugars on average 112-125. Patient reports she does not ever feel episodes of hyperglycemia. Reports appetite is coming back and feeling good. Appt with dietician on 3/26      Last edited by Acey Lav, CMA on 11/27/2023  1:12 PM.       Discussed the use of AI scribe software for clinical note transcription with the patient, who gave verbal consent to proceed.  History of Present Illness   The patient, with a history of malnourishment, hypertension, and leg cramps, reports feeling more nourished since her last visit. She has been consuming more protein and fruits and drinking Boost. The patient suspects she has a UTI and has started taking antibiotics. She has a history of yeast infections and requests a prescription for Diflucan to prevent a yeast infection from the antibiotics. The patient's leg cramps have been diagnosed as spondylolisthesis and neuropathy, and she is currently undergoing physical therapy. The patient also discusses her blood pressure and thyroid levels, and she is scheduled to have these checked in April.         Medications: Outpatient Medications Prior to Visit  Medication Sig   ALPHA LIPOIC ACID PO Take by mouth.   amoxicillin (AMOXIL) 500 MG capsule Take 500 mg by mouth 2 (two) times daily.   Berberine Chloride  (BERBERINE HCI PO)    Blood Glucose Monitoring Suppl (ONE TOUCH ULTRA SYSTEM KIT) W/DEVICE KIT GLUCOMETER TEST STRIPS - Historical Medication  Check blood sugar daily ---- ULTRA ONE TOUCH  Started 11-Jul-2009 Active Comments: DX: 790.29   Boswellia Serrata (BOSWELLIA PO) Take by mouth.   COD LIVER OIL PO Take by mouth.   Cranberry 500 MG CAPS Take 1 capsule by mouth daily.   DIGESTIVE ENZYMES PO Take 1 capsule by mouth daily.   hydrocortisone 2.5 % cream Apply to rash on left arm twice daily until improved.   levothyroxine (SYNTHROID) 25 MCG tablet TAKE 1 TABLET BY MOUTH DAILY BEFORE BREAKFAST AND 2 TABLETS BY MOUTH ON THE WEEKENDS   MAGNESIUM PO Take by mouth.   Milk Thistle-Turmeric (SILYMARIN PO) Take by mouth.   MULTIPLE VITAMIN PO Take by mouth.   Omega-3 Fatty Acids (FISH OIL PO) Take by mouth.   Probiotic Product (CULTURELLE PROBIOTICS PO) Take by mouth.   tretinoin (RETIN-A) 0.05 % cream Apply topically at bedtime.   VITAMIN D PO Take by mouth.   Semaglutide,0.25 or 0.5MG /DOS, (OZEMPIC, 0.25 OR 0.5 MG/DOSE,) 2 MG/3ML SOPN Inject 0.5 mg into the skin once a week. (Patient not taking: Reported on 11/27/2023)   No facility-administered medications prior to visit.    Review of Systems     Objective    BP 116/86 Comment: home reading  Pulse 87   Ht 5\' 7"  (1.702 m)   Wt 190 lb 3.2 oz (86.3 kg)  LMP 11/05/2000   SpO2 98%   BMI 29.79 kg/m    Physical Exam Vitals reviewed.  Constitutional:      General: She is not in acute distress.    Appearance: Normal appearance. She is well-developed. She is not diaphoretic.  HENT:     Head: Normocephalic and atraumatic.  Eyes:     General: No scleral icterus.    Conjunctiva/sclera: Conjunctivae normal.  Neck:     Thyroid: No thyromegaly.  Cardiovascular:     Rate and Rhythm: Normal rate and regular rhythm.     Heart sounds: Normal heart sounds. No murmur heard. Pulmonary:     Effort: Pulmonary effort is normal. No respiratory  distress.     Breath sounds: Normal breath sounds. No wheezing, rhonchi or rales.  Musculoskeletal:     Cervical back: Neck supple.     Right lower leg: No edema.     Left lower leg: No edema.  Lymphadenopathy:     Cervical: No cervical adenopathy.  Skin:    General: Skin is warm and dry.     Findings: No rash.  Neurological:     Mental Status: She is alert and oriented to person, place, and time. Mental status is at baseline.  Psychiatric:        Mood and Affect: Mood normal.        Behavior: Behavior normal.      No results found for any visits on 11/27/23.  Assessment & Plan     Problem List Items Addressed This Visit       Respiratory   Allergic rhinitis     Endocrine   Diabetes (HCC)   Relevant Orders   Hemoglobin A1c   Hypothyroidism   Relevant Orders   TSH   Other Visit Diagnoses       Lightheadedness    -  Primary     UTI symptoms               Urinary Tract Infection (UTI) Self-diagnosed UTI with an over-the-counter test showing positive for nitrates or leukocytes. Symptoms include a raw and burning sensation around the urethra, improved with amoxicillin. A 5-day course of antibiotics is typically sufficient. - Increase amoxicillin dosage to three times a day for a total of 5-7 days (already has Rx) - Return for further testing if symptoms do not improve - will not test today as she is already on treatment)  Yeast Infection Frequent yeast infections post-antibiotic use. Previous Diflucan was ineffective. New prescription for Diflucan to prevent post-antibiotic yeast infection. - Prescribe Diflucan post-antibiotic course  Spondylolisthesis with Sciatica Spondylolisthesis likely causing sciatic nerve compression. Temporary relief from a steroid pack, but leg cramps persist. Nerve conduction study indicated neuropathy and radiculopathy. Engaged in physical therapy and home exercises. - Continue physical therapy for six weeks  Hypertension Improved  blood pressure likely due to discontinuation of Ozempic and increased nutritional intake. Plans to discuss management with a dietician, considering family history. No more lightheadedness episoes  Diabetes Mellitus Previously on Ozempic, now discontinued. A1c levels to be checked in April. Scheduled dietician consultation for dietary management. - Order A1c test in April - Attend dietician appointment on March 26  Thyroid Function TSH levels previously low-normal. Re-evaluation planned for April, especially with recent dietary changes. - Order TSH test in April  Allergic Rhinitis Increased allergy symptoms. Currently on Claritin, considering switch to Zyrtec due to potential tolerance. Advised to try Zyrtec and consider alternatives if ineffective. - Switch from Claritin to  Zyrtec - Consider alternative antihistamines if Zyrtec is not effective  General Health Maintenance Improving nutrition and physical activity. Scheduled dietician visit and increased daily walking to 45 minutes in three sessions. - Attend dietician appointment on March 26 - Continue walking 45 minutes daily, divided into three sessions  Follow-up Scheduled follow-up in July. Will review lab results and address issues as needed. - Review lab results in April - Follow-up appointment in July       Return in about 4 months (around 03/28/2024) for as scheduled.       Shirlee Latch, MD  [facility] [phone] (phone) [phone] (fax)  [facility]

## 2023-12-04 DIAGNOSIS — M5451 Vertebrogenic low back pain: Secondary | ICD-10-CM | POA: Diagnosis not present

## 2023-12-10 ENCOUNTER — Encounter: Payer: Self-pay | Admitting: Dietician

## 2023-12-10 ENCOUNTER — Encounter: Payer: BC Managed Care – PPO | Attending: Family Medicine | Admitting: Dietician

## 2023-12-10 DIAGNOSIS — Z713 Dietary counseling and surveillance: Secondary | ICD-10-CM | POA: Insufficient documentation

## 2023-12-10 DIAGNOSIS — E1142 Type 2 diabetes mellitus with diabetic polyneuropathy: Secondary | ICD-10-CM | POA: Insufficient documentation

## 2023-12-10 NOTE — Patient Instructions (Addendum)
 Check your blood sugar each morning before eating or drinking (fasting). Look for numbers between 70-100 mg/dL Check your blood sugar 2 hours after you begin eating a meal. Look for numbers under 150 mg/dL at all times. Please bring your glucose meter to our next visit!!  Begin to build your meals using the proportions of the Balanced Plate. First, select your carb choice(s) for the meal, and determine how much you should have to equal 30-45 g of carbs. Make this 25% of your meal. Next, select your source of protein to pair with your carb choice(s). Make this another 25% of your meal. Finally, complete your meal with a variety of non-starchy vegetables. Make this the remaining 50% of your meal.  Try taking 15 minute walks about an hour after your meals, and keep up the great work incorporating resistance exercise 2-3 times per week!!

## 2023-12-10 NOTE — Progress Notes (Signed)
 Diabetes Self-Management Education  Visit Type: First/Initial  Appt. Start Time: 1400 Appt. End Time: 1520  12/10/2023  [name], identified by name and date of birth, is a 63 y.o. female with a diagnosis of Diabetes: Type 2.   ASSESSMENT Pt reports goal of staying off of DM medications, states they were on Ozempic for 28 months prior to PCP visit (11/27/2023), states they became malnourished/dehydrated due to decline appetite from Ozempic and suffered severe bout of weakness before d/c taking it. Pt reports currently taking natural supplements for glucose control (Glucotrol, Berberine) Pt reports sciatica in R side, currently doing PT for this. Pt reports leg cramps at night that are improving, but still resurface at times. Pt reports walking 45 minutes per day now and is doing resistance training 2x a week.  Pt reports having a weakness for breads and baked goods, has been doing well avoiding them at most times, states they will have strong cravings if they eat any so they try to avoid them all together. Pt reports logging foods in their calendar, has interest in using app/log to count calories and carbs.   Diabetes Self-Management Education - 12/10/23 1416       Visit Information   Visit Type First/Initial      Initial Visit   Diabetes Type Type 2    Are you currently following a meal plan? No    Are you taking your medications as prescribed? Not on Medications      Health Coping   How would you rate your overall health? Good      Psychosocial Assessment   Patient Belief/Attitude about Diabetes Motivated to manage diabetes    What is the hardest part about your diabetes right now, causing you the most concern, or is the most worrisome to you about your diabetes?   Making healty food and beverage choices;Getting support / problem solving    Self-management support Doctor's office;Church;Family    Other persons present Patient    Patient Concerns Healthy Lifestyle;Glycemic  Control;Nutrition/Meal planning    Special Needs None    Preferred Learning Style No preference indicated    Learning Readiness Ready    How often do you need to have someone help you when you read instructions, pamphlets, or other written materials from your doctor or pharmacy? 1 - Never    What is the last grade level you completed in school? 2 years of college      Pre-Education Assessment   Patient understands the diabetes disease and treatment process. Needs Review    Patient understands incorporating nutritional management into lifestyle. Needs Review    Patient undertands incorporating physical activity into lifestyle. Needs Review    Patient understands using medications safely. N/A (comment)   Stopped medication earlier this month   Patient understands monitoring blood glucose, interpreting and using results Comprehends key points    Patient understands prevention, detection, and treatment of acute complications. Needs Review    Patient understands prevention, detection, and treatment of chronic complications. Needs Review    Patient understands how to develop strategies to address psychosocial issues. Needs Review    Patient understands how to develop strategies to promote health/change behavior. Needs Review      Complications   Last HgB A1C per patient/outside source 5.7 %   09/18/2023   How often do you check your blood sugar? 1-2 times/day    Fasting Blood glucose range (mg/dL) 13-086    Postprandial Blood glucose range (mg/dL) 578-469  Number of hypoglycemic episodes per month 0    Have you had a dilated eye exam in the past 12 months? Yes    Have you had a dental exam in the past 12 months? Yes    Are you checking your feet? Yes    How many days per week are you checking your feet? 7      Dietary Intake   Breakfast Scrambled eggs, hasbrown, bacon, fruit, 1/4 waffle, OJ, water (Usually a Smoothie w/ blueberries, plant protein, collagen, spinach),    Lunch BLT, water     Snack (afternoon) Peanuts    Dinner Peach Activia    Snack (evening) mixed fruit, chips    Beverage(s) Water      Activity / Exercise   Activity / Exercise Type ADL's    How many days per week do you exercise? 7    How many minutes per day do you exercise? 30    Total minutes per week of exercise 210      Patient Education   Previous Diabetes Education Yes (please comment)    Disease Pathophysiology Explored patient's options for treatment of their diabetes    Healthy Eating Plate Method;Reviewed blood glucose goals for pre and post meals and how to evaluate the patients' food intake on their blood glucose level.;Carbohydrate counting    Being Active Role of exercise on diabetes management, blood pressure control and cardiac health.;Helped patient identify appropriate exercises in relation to his/her diabetes, diabetes complications and other health issue.    Monitoring Identified appropriate SMBG and/or A1C goals.    Acute complications Taught prevention, symptoms, and  treatment of hypoglycemia - the 15 rule.    Chronic complications Relationship between chronic complications and blood glucose control      Individualized Goals (developed by patient)   Nutrition Follow meal plan discussed    Physical Activity Exercise 5-7 days per week    Medications Not Applicable    Monitoring  Test my blood glucose as discussed;Send in my blood glucose log as discussed      Post-Education Assessment   Patient understands the diabetes disease and treatment process. Demonstrates understanding / competency    Patient understands incorporating nutritional management into lifestyle. Demonstrates understanding / competency    Patient undertands incorporating physical activity into lifestyle. Demonstrates understanding / competency    Patient understands using medications safely. N/A    Patient understands monitoring blood glucose, interpreting and using results Demonstrates understanding / competency     Patient understands prevention, detection, and treatment of acute complications. Demonstrates understanding / competency    Patient understands prevention, detection, and treatment of chronic complications. Demonstrates understanding / competency    Patient understands how to develop strategies to address psychosocial issues. Demonstrates understanding / competency    Patient understands how to develop strategies to promote health/change behavior. Demonstrates understanding / competency      Outcomes   Expected Outcomes Demonstrated interest in learning. Expect positive outcomes    Future DMSE 2 months    Program Status Not Completed             Individualized Plan for Diabetes Self-Management Training:   Learning Objective:  Patient will have a greater understanding of diabetes self-management. Patient education plan is to attend individual and/or group sessions per assessed needs and concerns.   Plan:   Patient Instructions  Check your blood sugar each morning before eating or drinking (fasting). Look for numbers between 70-100 mg/dL Check your blood sugar 2 hours  after you begin eating a meal. Look for numbers under 150 mg/dL at all times. Please bring your glucose meter to our next visit!!  Begin to build your meals using the proportions of the Balanced Plate. First, select your carb choice(s) for the meal, and determine how much you should have to equal 30-45 g of carbs. Make this 25% of your meal. Next, select your source of protein to pair with your carb choice(s). Make this another 25% of your meal. Finally, complete your meal with a variety of non-starchy vegetables. Make this the remaining 50% of your meal.  Try taking 15 minute walks about an hour after your meals, and keep up the great work incorporating resistance exercise 2-3 times per week!!   Expected Outcomes:  Demonstrated interest in learning. Expect positive outcomes  Education material provided: My Plate  and Carbohydrate counting sheet, Food log  If problems or questions, patient to contact team via:  Phone and Email  Future DSME appointment: 2 months

## 2023-12-11 DIAGNOSIS — M5451 Vertebrogenic low back pain: Secondary | ICD-10-CM | POA: Diagnosis not present

## 2023-12-18 DIAGNOSIS — M5451 Vertebrogenic low back pain: Secondary | ICD-10-CM | POA: Diagnosis not present

## 2023-12-19 DIAGNOSIS — E039 Hypothyroidism, unspecified: Secondary | ICD-10-CM | POA: Diagnosis not present

## 2023-12-19 DIAGNOSIS — E1142 Type 2 diabetes mellitus with diabetic polyneuropathy: Secondary | ICD-10-CM | POA: Diagnosis not present

## 2023-12-20 LAB — TSH: TSH: 1.93 u[IU]/mL (ref 0.450–4.500)

## 2023-12-20 LAB — HEMOGLOBIN A1C
Est. average glucose Bld gHb Est-mCnc: 123 mg/dL
Hgb A1c MFr Bld: 5.9 % — ABNORMAL HIGH (ref 4.8–5.6)

## 2023-12-22 ENCOUNTER — Encounter: Payer: Self-pay | Admitting: Family Medicine

## 2023-12-25 DIAGNOSIS — M5451 Vertebrogenic low back pain: Secondary | ICD-10-CM | POA: Diagnosis not present

## 2024-01-04 ENCOUNTER — Other Ambulatory Visit: Payer: Self-pay | Admitting: Family Medicine

## 2024-01-09 ENCOUNTER — Other Ambulatory Visit: Payer: Self-pay | Admitting: Obstetrics and Gynecology

## 2024-01-09 DIAGNOSIS — L280 Lichen simplex chronicus: Secondary | ICD-10-CM | POA: Diagnosis not present

## 2024-01-09 DIAGNOSIS — Z01419 Encounter for gynecological examination (general) (routine) without abnormal findings: Secondary | ICD-10-CM | POA: Diagnosis not present

## 2024-01-09 DIAGNOSIS — Z1231 Encounter for screening mammogram for malignant neoplasm of breast: Secondary | ICD-10-CM

## 2024-01-12 ENCOUNTER — Other Ambulatory Visit: Payer: Self-pay | Admitting: Obstetrics and Gynecology

## 2024-01-12 DIAGNOSIS — Z1231 Encounter for screening mammogram for malignant neoplasm of breast: Secondary | ICD-10-CM

## 2024-01-12 DIAGNOSIS — M5451 Vertebrogenic low back pain: Secondary | ICD-10-CM | POA: Diagnosis not present

## 2024-02-05 ENCOUNTER — Ambulatory Visit
Admission: RE | Admit: 2024-02-05 | Discharge: 2024-02-05 | Disposition: A | Discharge status: Home / Self Care | Source: Ambulatory Visit | Attending: Obstetrics and Gynecology | Admitting: Obstetrics and Gynecology

## 2024-02-05 DIAGNOSIS — Z1231 Encounter for screening mammogram for malignant neoplasm of breast: Secondary | ICD-10-CM | POA: Insufficient documentation

## 2024-02-12 ENCOUNTER — Encounter: Attending: Family Medicine | Admitting: Dietician

## 2024-02-12 DIAGNOSIS — E1142 Type 2 diabetes mellitus with diabetic polyneuropathy: Secondary | ICD-10-CM | POA: Insufficient documentation

## 2024-02-12 NOTE — Progress Notes (Unsigned)
 Pt reports slight increase in A1c, up from 5.7% to 5.9%, states they took a few trips and ate outside of their diet a bit more over the past couple of months. Pt reports continuing to take Glucotrol, Berberine, and started a cinnamon/chromium supplement. Pt reports logging foods and blood sugar for the past 2 months  Pt reports walking for 15 minutes after meals, states it is helping to keep glucose lower. Pt reports continuing sciatica pain that is leading to elevated FBG values when in pain the prior night.  B - spinach, frozen berries, plant protein and collagen powder L -  Cereal w/ skim milk D - 1 chicken tender, green beans, mashed potatoes, mixed fruit   Diabetes Self-Management Education - 12/10/23 1416       Visit Information   Visit Type First/Initial      Initial Visit   Diabetes Type Type 2    Are you currently following a meal plan? No    Are you taking your medications as prescribed? Not on Medications      Health Coping   How would you rate your overall health? Good      Psychosocial Assessment   Patient Belief/Attitude about Diabetes Motivated to manage diabetes    What is the hardest part about your diabetes right now, causing you the most concern, or is the most worrisome to you about your diabetes?   Making healty food and beverage choices;Getting support / problem solving    Self-management support Doctor's office;Church;Family    Other persons present Patient    Patient Concerns Healthy Lifestyle;Glycemic Control;Nutrition/Meal planning    Special Needs None    Preferred Learning Style No preference indicated    Learning Readiness Ready    How often do you need to have someone help you when you read instructions, pamphlets, or other written materials from your doctor or pharmacy? 1 - Never    What is the last grade level you completed in school? 2 years of college      Pre-Education Assessment   Patient understands the diabetes disease and treatment  process. Needs Review    Patient understands incorporating nutritional management into lifestyle. Needs Review    Patient undertands incorporating physical activity into lifestyle. Needs Review    Patient understands using medications safely. N/A (comment)   Stopped medication earlier this month   Patient understands monitoring blood glucose, interpreting and using results Comprehends key points    Patient understands prevention, detection, and treatment of acute complications. Needs Review    Patient understands prevention, detection, and treatment of chronic complications. Needs Review    Patient understands how to develop strategies to address psychosocial issues. Needs Review    Patient understands how to develop strategies to promote health/change behavior. Needs Review      Complications   Last HgB A1C per patient/outside source 5.7 %   09/18/2023   How often do you check your blood sugar? 1-2 times/day    Fasting Blood glucose range (mg/dL) 04-540    Postprandial Blood glucose range (mg/dL) 981-191    Number of hypoglycemic episodes per month 0    Have you had a dilated eye exam in the past 12 months? Yes    Have you had a dental exam in the past 12 months? Yes    Are you checking your feet? Yes    How many days per week are you checking your feet? 7      Dietary Intake  Breakfast Scrambled eggs, hasbrown, bacon, fruit, 1/4 waffle, OJ, water  (Usually a Smoothie w/ blueberries, plant protein, collagen, spinach),    Lunch BLT, water     Snack (afternoon) Peanuts    Dinner Peach Activia    Snack (evening) mixed fruit, chips    Beverage(s) Water       Activity / Exercise   Activity / Exercise Type ADL's    How many days per week do you exercise? 7    How many minutes per day do you exercise? 30    Total minutes per week of exercise 210      Patient Education   Previous Diabetes Education Yes (please comment)    Disease Pathophysiology Explored patient's options for treatment of  their diabetes    Healthy Eating Plate Method;Reviewed blood glucose goals for pre and post meals and how to evaluate the patients' food intake on their blood glucose level.;Carbohydrate counting    Being Active Role of exercise on diabetes management, blood pressure control and cardiac health.;Helped patient identify appropriate exercises in relation to his/her diabetes, diabetes complications and other health issue.    Monitoring Identified appropriate SMBG and/or A1C goals.    Acute complications Taught prevention, symptoms, and  treatment of hypoglycemia - the 15 rule.    Chronic complications Relationship between chronic complications and blood glucose control      Individualized Goals (developed by patient)   Nutrition Follow meal plan discussed    Physical Activity Exercise 5-7 days per week    Medications Not Applicable    Monitoring  Test my blood glucose as discussed;Send in my blood glucose log as discussed      Post-Education Assessment   Patient understands the diabetes disease and treatment process. Demonstrates understanding / competency    Patient understands incorporating nutritional management into lifestyle. Demonstrates understanding / competency    Patient undertands incorporating physical activity into lifestyle. Demonstrates understanding / competency    Patient understands using medications safely. N/A    Patient understands monitoring blood glucose, interpreting and using results Demonstrates understanding / competency    Patient understands prevention, detection, and treatment of acute complications. Demonstrates understanding / competency    Patient understands prevention, detection, and treatment of chronic complications. Demonstrates understanding / competency    Patient understands how to develop strategies to address psychosocial issues. Demonstrates understanding / competency    Patient understands how to develop strategies to promote health/change behavior.  Demonstrates understanding / competency      Outcomes   Expected Outcomes Demonstrated interest in learning. Expect positive outcomes    Future DMSE 2 months    Program Status Not Completed             Individualized Plan for Diabetes Self-Management Training:   Learning Objective:  Patient will have a greater understanding of diabetes self-management. Patient education plan is to attend individual and/or group sessions per assessed needs and concerns.   Plan:   Patient Instructions  Check your blood sugar each morning before eating or drinking (fasting). Look for numbers between 70-100 mg/dL Check your blood sugar 2 hours after you begin eating a meal. Look for numbers under 150 mg/dL at all times. Please bring your glucose meter to our next visit!!  Begin to build your meals using the proportions of the Balanced Plate. First, select your carb choice(s) for the meal, and determine how much you should have to equal 30-45 g of carbs. Make this 25% of your meal. Next, select your source of protein  to pair with your carb choice(s). Make this another 25% of your meal. Finally, complete your meal with a variety of non-starchy vegetables. Make this the remaining 50% of your meal.  Try taking 15 minute walks about an hour after your meals, and keep up the great work incorporating resistance exercise 2-3 times per week!!   Expected Outcomes:  Demonstrated interest in learning. Expect positive outcomes  Education material provided: My Plate and Carbohydrate counting sheet, Food log  If problems or questions, patient to contact team via:  Phone and Email  Future DSME appointment: 2 months

## 2024-02-12 NOTE — Patient Instructions (Addendum)
 Continue to check your blood sugar each morning fasting and after meals that you are unfamiliar with. Your goal for fasting is to get towards 100 or below, and keeping it under 150 after meals!  Monitor your fat intake and pay attention to how it affects your glucose after a meal!!  Keep up the good work!!

## 2024-02-13 ENCOUNTER — Encounter: Payer: Self-pay | Admitting: Dietician

## 2024-03-10 ENCOUNTER — Telehealth: Payer: Self-pay

## 2024-03-10 ENCOUNTER — Encounter

## 2024-03-10 ENCOUNTER — Ambulatory Visit: Payer: Self-pay

## 2024-03-10 NOTE — Telephone Encounter (Signed)
 Pt advised Dr.B is not in office today and with her leaving tomorrow we wouldn't be able to get her into office but she can be seen with [facility] as it assists patients with being seen until 8 pm with the first available provider. She verbalized understanding

## 2024-03-10 NOTE — Telephone Encounter (Signed)
 Copied from CRM [id number]. Topic: Clinical - Red Word Triage >> Mar 10, 2024  2:16 PM Emylou G wrote: [facility] that prompted transfer to Nurse Triage: anxiety and panicky/shakey Reason for Disposition . General information question, no triage required and triager able to answer question  Answer Assessment - Initial Assessment Questions 1. REASON FOR CALL or QUESTION: What is your reason for calling today? or How can I best help you? or What question do you have that I can help answer?     New Medicare Card - [id number]  Protocols used: Information Only Call - No Triage-A-AH

## 2024-03-10 NOTE — Telephone Encounter (Signed)
 Copied from CRM [id number]. Topic: Clinical - Medication Question >> Mar 10, 2024 12:19 PM Cristopher B wrote: Reason for CRM: pt  called in because she is going on a trip tomorrow and was wondering if Dr.B can call something for being anxious and also for motion sickness . She has over the counter meds to bring with her but if she can get something prescribed then she will take it if she needs it.

## 2024-03-29 ENCOUNTER — Encounter: Payer: Self-pay | Admitting: Family Medicine

## 2024-03-29 ENCOUNTER — Ambulatory Visit: Payer: Self-pay | Admitting: Family Medicine

## 2024-03-29 VITALS — BP 120/86 | HR 79 | Resp 16 | Ht 67.0 in | Wt 188.9 lb

## 2024-03-29 DIAGNOSIS — Z780 Asymptomatic menopausal state: Secondary | ICD-10-CM

## 2024-03-29 DIAGNOSIS — Z23 Encounter for immunization: Secondary | ICD-10-CM | POA: Diagnosis not present

## 2024-03-29 DIAGNOSIS — Z0001 Encounter for general adult medical examination with abnormal findings: Secondary | ICD-10-CM

## 2024-03-29 DIAGNOSIS — Z0000 Encounter for general adult medical examination without abnormal findings: Secondary | ICD-10-CM

## 2024-03-29 NOTE — Progress Notes (Unsigned)
 Medicare Initial Preventative Physical Exam    Patient: Veronica Butler, Female    DOB: Jan 28, 1961, 63 y.o.   MRN: [id number] Visit Date: 03/29/2024  Today's Provider: Jon Eva, MD   Chief Complaint  Patient presents with   Medicare Wellness   Subjective    Medicare Initial Preventative Physical Exam Veronica Butler is a 63 y.o. female who presents today for her Initial Preventative Physical Exam.   Discussed the use of AI scribe software for clinical note transcription with the patient, who gave verbal consent to proceed.  History of Present Illness   Veronica Butler is a 63 year old female who presents for a Welcome to Medicare visit.  She recently returned from a cruise where she contracted COVID-19, experiencing congestion and cough. She is currently in the 'thick mucus stage' and likens it to a bad cold. She is taking an NAC amino acid supplement for lung health and plans to discontinue it in a week.  She experiences severe nightly leg cramps and hand cramps several times a day, which are painful and require her to pry her hands open. She started taking calcium supplements twice daily, in addition to the calcium in her multivitamin, and has noticed improvement with no leg cramps for the past two nights and no hand cramps yesterday. She separates calcium intake from her thyroid  medication by at least five hours.  She is seeing a dietitian and finds it helpful. She had a dietary lapse during a recent cruise but is now back on track. Her last A1c was checked in April. She had a mammogram in May and is up to date on her colonoscopy until 2029. No falls, memory, and depression screenings were good.         Social History   Socioeconomic History   Marital status: Married    Spouse Butler: Not on file   Number of children: Not on file   Years of education: Not on file   Highest education level: Associate degree: occupational, Scientist, product/process development, or vocational program  Occupational  History   Not on file  Tobacco Use   Smoking status: Never   Smokeless tobacco: Never  Substance and Sexual Activity   Alcohol use: No   Drug use: No   Sexual activity: Not on file  Other Topics Concern   Not on file  Social History Narrative   Not on file   Social Drivers of Health   Financial Resource Strain: Low Risk  (03/22/2024)   Overall Financial Resource Strain (CARDIA)    Difficulty of Paying Living Expenses: Not hard at all  Food Insecurity: No Food Insecurity (03/22/2024)   Hunger Vital Sign    Worried About Running Out of Food in the Last Year: Never true    Ran Out of Food in the Last Year: Never true  Transportation Needs: No Transportation Needs (03/22/2024)   PRAPARE - Administrator, Civil Service (Medical): No    Lack of Transportation (Non-Medical): No  Physical Activity: Sufficiently Active (03/22/2024)   Exercise Vital Sign    Days of Exercise per Week: 7 days    Minutes of Exercise per Session: 40 min  Stress: No Stress Concern Present (03/22/2024)   [employer] of Occupational Health - Occupational Stress Questionnaire    Feeling of Stress: Not at all  Social Connections: Moderately Integrated (03/22/2024)   Social Connection and Isolation Panel    Frequency of Communication with Friends and Family: Three times a  week    Frequency of Social Gatherings with Friends and Family: Once a week    Attends Religious Services: More than 4 times per year    Active Member of [employer] or Organizations: No    Attends Engineer, structural: Not on file    Marital Status: Married  Catering manager Violence: Not on file    Past Medical History:  Diagnosis Date   Allergy    Seasonal   Cancer (HCC) 2002   Cervical CA with hysterectomy.   Cataract    Minor. Born with   Diabetes (HCC) 08/22/2015   Dysplastic nevus 11/02/2019   Left spinal mid back. Moderate atypia, peripheral margin involved   Dysplastic nevus 11/02/2019   Right spinal mid back.  Severe atypia, peripheral margin involved. Excised 11/29/2019, margins free.   Dysplastic nevus 05/01/2010   Right medial lower calf. Mild to moderate atypia, extends to one edge.   Dysplastic nevus 03/26/2022   Left Top of Shoulder, mild atypia   Heart murmur    Hx of dysplastic nevus 2007   multiple sites    Hx of dysplastic nevus 2007   multiple sites    Hypertension associated with diabetes (HCC) 04/05/2021   Migraines    PONV (postoperative nausea and vomiting)    Sleep apnea    CPAP, recently stopped using due to 50lb weight loss   Thyroid  disease    Nodules   Trigeminal neuralgia      Patient Active Problem List   Diagnosis Date Noted   Polyp of colon 02/21/2023   Gastric erythema 02/21/2023   Drug-induced constipation 08/29/2022   Microalbuminuria due to type 2 diabetes mellitus (HCC) 08/29/2022   Hypertension associated with diabetes (HCC) 04/05/2021   Multinodular thyroid  05/19/2020   Bilateral leg cramps 03/17/2019   Obesity 08/03/2018   Internal hemorrhoids    Diverticulosis of large intestine without diverticulitis    Hypothyroidism 08/01/2017   Trigeminal neuralgia 08/01/2017   Diabetes (HCC) 08/22/2015   NASH (nonalcoholic steatohepatitis) 02/22/2015   CA cervix (HCC) 01/06/2015   Headache, migraine 01/06/2015   Neuropathy 01/06/2015   Apnea, sleep 01/06/2015   Chronic venous insufficiency 01/06/2015   Chronic urinary tract infection 02/25/2009   Allergic rhinitis 01/06/2009   Hyperlipidemia associated with type 2 diabetes mellitus (HCC) 02/19/2008   B12 deficiency 08/23/2007   Acid reflux 08/23/2007   Cannot sleep 08/23/2007   Adaptive colitis 08/23/2007   Cyst of ovary 08/23/2007    Past Surgical History:  Procedure Laterality Date   ABDOMINAL HYSTERECTOMY  2002   still has ovaries   CHOLECYSTECTOMY  1998   COLONOSCOPY WITH PROPOFOL  N/A 10/28/2017   Procedure: COLONOSCOPY WITH PROPOFOL ;  Surgeon: Janalyn Keene NOVAK, MD;  [facility];  Service: Endoscopy;  Laterality: N/A;   COLONOSCOPY WITH PROPOFOL  N/A 02/21/2023   Procedure: COLONOSCOPY WITH PROPOFOL ;  Surgeon: Unk Corinn Skiff, MD;  Location: [facility] END[facility];  Service: Gastroenterology;  Laterality: N/A;   ESOPHAGOGASTRODUODENOSCOPY (EGD) WITH PROPOFOL  N/A 02/21/2023   Procedure: ESOPHAGOGASTRODUODENOSCOPY (EGD) WITH PROPOFOL ;  Surgeon: Unk Corinn Skiff, MD;  Location: [facility] ENDOSCOPY;  Service: Gastroenterology;  Laterality: N/A;   HERNIA REPAIR     along with gallbladder surgery   navel cyst  1980'   removed   TONSILLECTOMY  at age 52    Her family history includes Alcohol abuse in her father; Basal cell carcinoma in her mother; Cancer in her father and maternal grandmother; Dementia in her maternal grandmother; Diabetes in her maternal  aunt and paternal uncle; Hypertension in her mother; Sarcoidosis in her sister.   Current Outpatient Medications:    ALPHA LIPOIC ACID PO, Take by mouth., Disp: , Rfl:    Berberine Chloride (BERBERINE HCI PO), , Disp: , Rfl:    Blood Glucose Monitoring Suppl (ONE TOUCH ULTRA SYSTEM KIT) W/DEVICE KIT, GLUCOMETER TEST STRIPS - Historical Medication  Check blood sugar daily ---- ULTRA ONE TOUCH  Started 11-Jul-2009 Active Comments: DX: 790.29, Disp: , Rfl:    Calcium Acetate, Phos Binder, (CALCIUM ACETATE PO), Take 300 mg by mouth in the morning and at bedtime., Disp: , Rfl:    COD LIVER OIL PO, Take by mouth., Disp: , Rfl:    Cranberry 500 MG CAPS, Take 1 capsule by mouth daily., Disp: , Rfl:    DIGESTIVE ENZYMES PO, Take 1 capsule by mouth daily., Disp: , Rfl:    hydrocortisone  2.5 % cream, Apply to rash on left arm twice daily until improved., Disp: 30 g, Rfl: 0   levothyroxine  (SYNTHROID ) 25 MCG tablet, TAKE 1 TABLET BY MOUTH DAILY BEFORE BREAKFAST AND 2 TABLETS BY MOUTH ON THE WEEKENDS, Disp: 117 tablet, Rfl: 1   MAGNESIUM PO, Take by mouth., Disp: , Rfl:    Milk Thistle-Turmeric (SILYMARIN PO), Take by mouth., Disp:  , Rfl:    MULTIPLE VITAMIN PO, Take by mouth., Disp: , Rfl:    Omega-3 Fatty Acids (FISH OIL PO), Take by mouth., Disp: , Rfl:    Probiotic Product (CULTURELLE PROBIOTICS PO), Take by mouth., Disp: , Rfl:    tretinoin  (RETIN-A ) 0.05 % cream, Apply topically at bedtime., Disp: 20 g, Rfl: 2   VITAMIN D PO, Take by mouth., Disp: , Rfl:    amoxicillin  (AMOXIL ) 500 MG capsule, Take 500 mg by mouth 2 (two) times daily., Disp: , Rfl:    Boswellia Serrata (BOSWELLIA PO), Take by mouth., Disp: , Rfl:    Semaglutide ,0.25 or 0.5MG /DOS, (OZEMPIC , 0.25 OR 0.5 MG/DOSE,) 2 MG/3ML SOPN, Inject 0.5 mg into the skin once a week. (Patient not taking: Reported on 11/27/2023), Disp: 9 mL, Rfl: 1   Patient Care Team: Myrla Jon HERO, MD as PCP - General ([facility], [facility]  Review of Systems     Objective    Vitals: BP 120/86 (BP Location: Left Arm, Patient Position: Sitting, Cuff Size: Normal)   Pulse 79   Resp 16   Ht 5' 7 (1.702 m)   Wt 188 lb 14.4 oz (85.7 kg)   LMP 11/05/2000   SpO2 97%   BMI 29.59 kg/m  No results found. Physical Exam Vitals reviewed.  Constitutional:      General: She is not in acute distress.    Appearance: Normal appearance. She is well-developed. She is not diaphoretic.  HENT:     Head: Normocephalic and atraumatic.     Right Ear: Tympanic membrane, ear canal and external ear normal.     Left Ear: Tympanic membrane, ear canal and external ear normal.     Nose: Nose normal.     Mouth/Throat:     Mouth: Mucous membranes are moist.     Pharynx: Oropharynx is clear. No oropharyngeal exudate.  Eyes:     General: No scleral icterus.    Conjunctiva/sclera: Conjunctivae normal.     Pupils: Pupils are equal, round, and reactive to light.  Neck:     Thyroid : No thyromegaly.  Cardiovascular:     Rate and Rhythm: Normal rate and regular rhythm.  Heart sounds: Normal heart sounds. No murmur heard. Pulmonary:     Effort: Pulmonary effort is  normal. No respiratory distress.     Breath sounds: Normal breath sounds. No wheezing or rales.  Abdominal:     General: There is no distension.     Palpations: Abdomen is soft.     Tenderness: There is no abdominal tenderness.  Musculoskeletal:        General: No deformity.     Cervical back: Neck supple.     Right lower leg: No edema.     Left lower leg: No edema.  Lymphadenopathy:     Cervical: No cervical adenopathy.  Skin:    General: Skin is warm and dry.     Findings: No rash.  Neurological:     Mental Status: She is alert and oriented to person, place, and time. Mental status is at baseline.     Gait: Gait normal.  Psychiatric:        Mood and Affect: Mood normal.        Behavior: Behavior normal.        Thought Content: Thought content normal.      Activities of Daily Living    03/29/2024    1:51 PM  In your present state of health, do you have any difficulty performing the following activities:  Hearing? 0  Vision? 0  Difficulty concentrating or making decisions? 0  Walking or climbing stairs? 0  Dressing or bathing? 0  Doing errands, shopping? 0  Preparing Food and eating ? N  Using the Toilet? N  In the past six months, have you accidently leaked urine? Y  Do you have problems with loss of bowel control? N  Managing your Medications? N  Managing your Finances? N  Housekeeping or managing your Housekeeping? N    Fall Risk Assessment    03/29/2024    2:05 PM 03/29/2024    1:54 PM 12/10/2023    2:04 PM 11/27/2023    1:13 PM 10/30/2023    2:02 PM  Fall Risk   Falls in the past year? 0 0 0 0 0  Number falls in past yr: 0 0  0 0  Injury with Fall? 0 0  0 0  Risk for fall due to : No Fall Risks No Fall Risks  No Fall Risks No Fall Risks  Follow up    Falls evaluation completed Falls evaluation completed     Depression Screen    03/29/2024    2:05 PM 12/10/2023    2:04 PM 11/27/2023    1:13 PM 10/30/2023    2:02 PM  PHQ 2/9 Scores  PHQ - 2 Score 0 0 0  2  PHQ- 9 Score 1  1 10        03/29/2024    1:53 PM  6CIT Screen  What Year? 0 points  What month? 0 points  What time? 0 points  Count back from 20 0 points  Months in reverse 0 points  Repeat phrase 0 points  Total Score 0 points    EKG: NSR, unchanged from prior  No results found.   No results found for any visits on 03/29/24.  Assessment & Plan      Initial Preventative Physical Exam  Reviewed patient's Family Medical History Reviewed and updated list of patient's medical providers Assessment of cognitive impairment was done Assessed patient's functional ability Established a written schedule for health screening services Health Risk Assessent Completed and Reviewed  Exercise  Activities and Dietary recommendations  Goals   None     Immunization History  Administered Date(s) Administered   Influenza Inj Mdck Quad Pf 06/10/2022   Influenza, Seasonal, Injecte, Preservative Fre 06/24/2023   Influenza,inj,Quad PF,6+ Mos 06/01/2015, 08/01/2017, 07/17/2018, 05/19/2020, 07/10/2021   Influenza,inj,quad, With Preservative 06/16/2020   PFIZER(Purple Top)SARS-COV-2 Vaccination 12/13/2019, 01/05/2020, 09/13/2020   PNEUMOCOCCAL CONJUGATE-20 03/29/2024   Pfizer Covid-19 Vaccine Bivalent Booster 48yrs & up 08/03/2021   Pneumococcal Polysaccharide-23 08/01/2017   Td 01/30/2018   Tdap 08/19/2007   Unspecified SARS-COV-2 Vaccination 12/16/2019, 01/15/2020, 08/16/2020    Health Maintenance  Topic Date Due   Zoster Vaccines- Shingrix (1 of 2) Never done   COVID-19 Vaccine (8 - 2024-25 season) 05/18/2023   OPHTHALMOLOGY EXAM  02/03/2024   DEXA SCAN  Never done   INFLUENZA VACCINE  04/16/2024   HEMOGLOBIN A1C  06/19/2024   Diabetic kidney evaluation - eGFR measurement  09/17/2024   Diabetic kidney evaluation - Urine ACR  09/17/2024   FOOT EXAM  09/17/2024   MAMMOGRAM  02/04/2025   Medicare Annual Wellness (AWV)  03/29/2025   DTaP/Tdap/Td (3 - Td or Tdap) 01/31/2028    Colonoscopy  02/21/2028   Pneumococcal Vaccine: 50+ Years  Completed   Hepatitis C Screening  Completed   HIV Screening  Completed   Hepatitis B Vaccines  Aged Out   HPV VACCINES  Aged Out   Meningococcal B Vaccine  Aged Out     Discussed health benefits of physical activity, and encouraged her to engage in regular exercise appropriate for her age and condition.   Problem List Items Addressed This Visit   None Visit Diagnoses       Welcome to Medicare preventive visit    -  Primary   Relevant Orders   EKG 12-Lead     Postmenopausal       Relevant Orders   DG Bone Density     Immunization due       Relevant Orders   Pneumococcal conjugate vaccine 20-valent (Prevnar 20) (Completed)           Adult Wellness Visit Routine adult wellness visit with normal EKG, memory, and depression screens. No falls reported. Mammogram up to date. Colonoscopy not due until 2029. Eye exam pending update. Bone density screening needed at age 17. - Order bone density scan - Ensure eye exam results are received  COVID-19 infection Recent COVID-19 infection contracted during a cruise with symptoms of congestion and thick mucus, resembling a bad cold. No severe symptoms reported.  Leg and hand cramps Persistent severe nightly leg cramps and hand cramps occurring several times a day. Recent calcium supplementation has improved symptoms. - Check calcium levels during next lab workup  General Health Maintenance Pneumonia vaccine recommended due to age and previous vaccination history. Shingles vaccine discussed, she remains undecided. No significant side effects expected from the pneumonia vaccine. She agreed to receive the pneumonia vaccine today. - Administer 20-valent pneumonia vaccine - Discuss shingles vaccine further at future visits        Return in about 3 months (around 06/29/2024) for chronic disease f/u.     Jon Eva, MD  [facility] [phone] (phone) [phone] (fax)  [facility]

## 2024-04-13 ENCOUNTER — Encounter: Payer: Self-pay | Admitting: Dermatology

## 2024-04-13 ENCOUNTER — Ambulatory Visit (INDEPENDENT_AMBULATORY_CARE_PROVIDER_SITE_OTHER): Payer: BC Managed Care – PPO | Admitting: Dermatology

## 2024-04-13 DIAGNOSIS — L988 Other specified disorders of the skin and subcutaneous tissue: Secondary | ICD-10-CM | POA: Diagnosis not present

## 2024-04-13 DIAGNOSIS — D2362 Other benign neoplasm of skin of left upper limb, including shoulder: Secondary | ICD-10-CM

## 2024-04-13 DIAGNOSIS — L821 Other seborrheic keratosis: Secondary | ICD-10-CM

## 2024-04-13 DIAGNOSIS — D1801 Hemangioma of skin and subcutaneous tissue: Secondary | ICD-10-CM

## 2024-04-13 DIAGNOSIS — L814 Other melanin hyperpigmentation: Secondary | ICD-10-CM

## 2024-04-13 DIAGNOSIS — L720 Epidermal cyst: Secondary | ICD-10-CM

## 2024-04-13 DIAGNOSIS — L578 Other skin changes due to chronic exposure to nonionizing radiation: Secondary | ICD-10-CM | POA: Diagnosis not present

## 2024-04-13 DIAGNOSIS — D239 Other benign neoplasm of skin, unspecified: Secondary | ICD-10-CM

## 2024-04-13 DIAGNOSIS — Z1283 Encounter for screening for malignant neoplasm of skin: Secondary | ICD-10-CM

## 2024-04-13 DIAGNOSIS — D229 Melanocytic nevi, unspecified: Secondary | ICD-10-CM

## 2024-04-13 MED ORDER — TRETINOIN 0.025 % EX CREA: TOPICAL_CREAM | CUTANEOUS | 6 refills | Status: AC

## 2024-04-13 NOTE — Progress Notes (Deleted)
 Follow-Up Visit   Subjective  Veronica Butler is a 63 y.o. female who presents for the following: 6 months TBSE  recheck marked nevi. Hx of multiple dysplastic nevi.   Patient here for full body skin exam and skin cancer screening.   The patient has spots, moles and lesions to be evaluated, some may be new or changing and the patient may have concern these could be cancer.    The following portions of the chart were reviewed this encounter and updated as appropriate: medications, allergies, medical history  Review of Systems:  No other skin or systemic complaints except as noted in HPI or Assessment and Plan.  Objective  Well appearing patient in no apparent distress; mood and affect are within normal limits.  A full examination was performed including scalp, head, eyes, ears, nose, lips, neck, chest, axillae, abdomen, back, buttocks, bilateral upper extremities, bilateral lower extremities, hands, feet, fingers, toes, fingernails, and toenails. All findings within normal limits unless otherwise noted below.   Relevant physical exam findings are noted in the Assessment and Plan.    Assessment & Plan    ACTINIC DAMAGE - Chronic condition, secondary to cumulative UV/sun exposure - diffuse scaly erythematous macules with underlying dyspigmentation - Recommend daily broad spectrum sunscreen SPF 30+ to sun-exposed areas, reapply every 2 hours as needed.  - Staying in the shade or wearing long sleeves, sun glasses (UVA+UVB protection) and wide brim hats (4-inch brim around the entire circumference of the hat) are also recommended for sun protection.  - Call for new or changing lesions.   MELANOCYTIC NEVI - Tan-brown and/or pink-flesh-colored symmetric macules and papules, new photos of back today   - 4 mm medium dark brown macule slight irregular border. Left lateral breast. Photo compared, no change   - 3 mm brown macule. Left foot dorsum, photo compared (03/14/21) no change   -  5 mm gray brown macule, slightly waxy. Left dorsal hand. Blue nevus vs SK, Stable   - 5 x 4 mm brown macule, darker center. Left spinal upper back, photo compared (07/24/20), no change   - 2 mm med dark brown macule, right lateral breast.   - 4 x 3 mm med dark brown macule L med clavicle   - 4 mm flesh papule with a tan center at the right hand dorsal    - 4 mm med brown macule R postauricular   - Benign appearing on exam today - Observation - Call clinic for new or changing moles - Recommend daily use of broad spectrum spf 30+ sunscreen to sun-exposed areas.    SK vs Lentigo vs nevus - Stable Exam:  Right Ear Concha  4.0 mm speckled brown macule with irregular border- pt states present for years no changes, photo compared from previous visit, some lightening noted   Treatment Plan:   Benign-appearing.  Observation.  Call clinic for new or changing lesions.  Recommend daily use of broad spectrum spf 30+ sunscreen to sun-exposed areas.     Milia vrs other cyst - 2 mm flesh-white papule. Left lower eyelid  - type of cyst - benign - may be extracted if symptomatic - observe   History of Dysplastic Nevi, multiple sites Light brown macule within scar of the left top of shoulder (mild dysplastic nevus). - No evidence of recurrence today - Recommend regular full body skin exams - Recommend daily broad spectrum sunscreen SPF 30+ to sun-exposed areas, reapply every 2 hours as needed.  - Call if  any new or changing lesions are noted between office visits     FACIAL ELASTOSIS Exam: Rhytides and volume loss.  Treatment Plan: Continue Tretinoin  0.05% cream apply to face at bedtime   Topical retinoid medications like tretinoin /Retin-A , adapalene/Differin, tazarotene/Fabior, and Epiduo/Epiduo Forte can cause dryness and irritation when first started. Only apply a pea-sized amount to the entire affected area. Avoid applying it around the eyes, edges of mouth and creases at the nose. If  you experience irritation, use a good moisturizer first and/or apply the medicine less often. If you are doing well with the medicine, you can increase how often you use it until you are applying every night. Be careful with sun protection while using this medication as it can make you sensitive to the sun. This medicine should not be used by pregnant women.    Recommend daily broad spectrum sunscreen SPF 30+ to sun-exposed areas, reapply every 2 hours as needed. Call for new or changing lesions.  Staying in the shade or wearing long sleeves, sun glasses (UVA+UVB protection) and wide brim hats (4-inch brim around the entire circumference of the hat) are also recommended for sun protection.    Discussed Elastin restorative eye treatment qd-bid   No follow-ups on file.  I, Sharday Michl, CMA, am acting as scribe for Rexene Rattler, MD.   Documentation: I have reviewed the above documentation for accuracy and completeness, and I agree with the above.  Rexene Rattler, MD

## 2024-04-13 NOTE — Progress Notes (Signed)
 Follow-Up Visit   Subjective  Veronica Butler is a 63 y.o. female who presents for the following: Skin Cancer Screening and Full Body Skin Exam. Hx of dysplastic nevi.  Recheck face. Patient states tretinoin  0.05% cream is drying and irritating. Could not use it very often so she D/C.  The patient presents for Total-Body Skin Exam (TBSE) for skin cancer screening and mole check. The patient has spots, moles and lesions to be evaluated, some may be new or changing and the patient may have concern these could be cancer.    The following portions of the chart were reviewed this encounter and updated as appropriate: medications, allergies, medical history  Review of Systems:  No other skin or systemic complaints except as noted in HPI or Assessment and Plan.  Objective  Well appearing patient in no apparent distress; mood and affect are within normal limits.  A full examination was performed including scalp, head, eyes, ears, nose, lips, neck, chest, axillae, abdomen, back, buttocks, bilateral upper extremities, bilateral lower extremities, hands, feet, fingers, toes, fingernails, and toenails. All findings within normal limits unless otherwise noted below.   Relevant physical exam findings are noted in the Assessment and Plan.    Assessment & Plan   SKIN CANCER SCREENING PERFORMED TODAY.  ACTINIC DAMAGE - Chronic condition, secondary to cumulative UV/sun exposure - diffuse scaly erythematous macules with underlying dyspigmentation - Recommend daily broad spectrum sunscreen SPF 30+ to sun-exposed areas, reapply every 2 hours as needed.  - Staying in the shade or wearing long sleeves, sun glasses (UVA+UVB protection) and wide brim hats (4-inch brim around the entire circumference of the hat) are also recommended for sun protection.  - Call for new or changing lesions.  LENTIGINES, SEBORRHEIC KERATOSES, HEMANGIOMAS - Benign normal skin lesions - Benign-appearing - Call for any  changes   MELANOCYTIC NEVI - Tan-brown and/or pink-flesh-colored symmetric macules and papules, new photos of back today  - 4 mm medium dark brown macule slight irregular border. Left lateral breast. Photo compared, no change  - 3 mm brown macule. Left foot dorsum, photo compared (03/14/21) no change  - 5 mm gray brown macule, slightly waxy. Left dorsal hand. Blue nevus , Stable  - 5 x 4 mm brown macule, darker center. Left spinal upper back, photo compared (07/24/20), no change  - 2 mm med dark brown macule, right lateral breast.  - 4 x 3 mm med dark brown macule L med clavicle  - 4 mm flesh papule with a tan center at the right hand dorsal   - 4 mm med brown macule R postauricular - 4 x 2 mm brown macule, appears as 2 adjacent, at central left lower abdomen - Benign appearing on exam today - Observation - Call clinic for new or changing moles - Recommend daily use of broad spectrum spf 30+ sunscreen to sun-exposed areas.    SK vs Lentigo vs nevus - Stable Exam:  Right Ear Concha  4.0 mm speckled light brown macule with irregular border- pt states present for years no changes, photo compared from previous visit,  much lighter when compared to baseline photo, new photo today   Treatment Plan:   Benign-appearing.  Observation.  Call clinic for new or changing lesions.  Recommend daily use of broad spectrum spf 30+ sunscreen to sun-exposed areas.     Milia vrs Meibomian cyst - 2 mm flesh-white papule. Left lower eyelid  - type of cyst - benign-appearing - observe   History of  Dysplastic Nevi, multiple sites Light brown macule within scar of the left top of shoulder (mild dysplastic nevus). - No evidence of recurrence today - Recommend regular full body skin exams - Recommend daily broad spectrum sunscreen SPF 30+ to sun-exposed areas, reapply every 2 hours as needed.  - Call if any new or changing lesions are noted between office visits  FACIAL ELASTOSIS Exam: Rhytides and volume  loss.   Treatment Plan: Start Tretinoin  0.025% cream apply to face at bedtime    Topical retinoid medications like tretinoin /Retin-A , adapalene/Differin, tazarotene/Fabior, and Epiduo/Epiduo Forte can cause dryness and irritation when first started. Only apply a pea-sized amount to the entire affected area. Avoid applying it around the eyes, edges of mouth and creases at the nose. If you experience irritation, use a good moisturizer first and/or apply the medicine less often. If you are doing well with the medicine, you can increase how often you use it until you are applying every night. Be careful with sun protection while using this medication as it can make you sensitive to the sun. This medicine should not be used by pregnant women.     Recommend daily broad spectrum sunscreen SPF 30+ to sun-exposed areas, reapply every 2 hours as needed. Call for new or changing lesions.  Staying in the shade or wearing long sleeves, sun glasses (UVA+UVB protection) and wide brim hats (4-inch brim around the entire circumference of the hat) are also recommended for sun protection.        Return in about 1 year (around 04/13/2025) for TBSE, Hx of dysplastic nevi.  I, Jill Parcell, CMA, am acting as scribe for Rexene Rattler, MD.   Documentation: I have reviewed the above documentation for accuracy and completeness, and I agree with the above.  Rexene Rattler, MD

## 2024-04-13 NOTE — Patient Instructions (Addendum)
 Start Tretinoin  0.025% cream apply to face at bedtime    Topical retinoid medications like tretinoin /Retin-A , adapalene/Differin, tazarotene/Fabior, and Epiduo/Epiduo Forte can cause dryness and irritation when first started. Only apply a pea-sized amount to the entire affected area. Avoid applying it around the eyes, edges of mouth and creases at the nose. If you experience irritation, use a good moisturizer first and/or apply the medicine less often. If you are doing well with the medicine, you can increase how often you use it until you are applying every night. Be careful with sun protection while using this medication as it can make you sensitive to the sun. This medicine should not be used by pregnant women.     Recommend daily broad spectrum sunscreen SPF 30+ to sun-exposed areas, reapply every 2 hours as needed. Call for new or changing lesions.  Staying in the shade or wearing long sleeves, sun glasses (UVA+UVB protection) and wide brim hats (4-inch brim around the entire circumference of the hat) are also recommended for sun protection.       Recommend daily broad spectrum sunscreen SPF 30+ to sun-exposed areas, reapply every 2 hours as needed. Call for new or changing lesions.  Staying in the shade or wearing long sleeves, sun glasses (UVA+UVB protection) and wide brim hats (4-inch brim around the entire circumference of the hat) are also recommended for sun protection.      Melanoma ABCDEs  Melanoma is the most dangerous type of skin cancer, and is the leading cause of death from skin disease.  You are more likely to develop melanoma if you: Have light-colored skin, light-colored eyes, or red or blond hair Spend a lot of time in the sun Tan regularly, either outdoors or in a tanning bed Have had blistering sunburns, especially during childhood Have a close family member who has had a melanoma Have atypical moles or large birthmarks  Early detection of melanoma is key since  treatment is typically straightforward and cure rates are extremely high if we catch it early.   The first sign of melanoma is often a change in a mole or a new dark spot.  The ABCDE system is a way of remembering the signs of melanoma.  A for asymmetry:  The two halves do not match. B for border:  The edges of the growth are irregular. C for color:  A mixture of colors are present instead of an even brown color. D for diameter:  Melanomas are usually (but not always) greater than 6mm - the size of a pencil eraser. E for evolution:  The spot keeps changing in size, shape, and color.  Please check your skin once per month between visits. You can use a small mirror in front and a large mirror behind you to keep an eye on the back side or your body.   If you see any new or changing lesions before your next follow-up, please call to schedule a visit.  Please continue daily skin protection including broad spectrum sunscreen SPF 30+ to sun-exposed areas, reapplying every 2 hours as needed when you're outdoors.   Staying in the shade or wearing long sleeves, sun glasses (UVA+UVB protection) and wide brim hats (4-inch brim around the entire circumference of the hat) are also recommended for sun protection.      Due to recent changes in healthcare laws, you may see results of your pathology and/or laboratory studies on MyChart before the doctors have had a chance to review them. We understand that in  some cases there may be results that are confusing or concerning to you. Please understand that not all results are received at the same time and often the doctors may need to interpret multiple results in order to provide you with the best plan of care or course of treatment. Therefore, we ask that you please give us  2 business days to thoroughly review all your results before contacting the office for clarification. Should we see a critical lab result, you will be contacted sooner.   If You Need Anything  After Your Visit  If you have any questions or concerns for your doctor, please call our main line at [phone] and press option 4 to reach your doctor's medical assistant. If no one answers, please leave a voicemail as directed and we will return your call as soon as possible. Messages left after 4 pm will be answered the following business day.   You may also send us  a message via MyChart. We typically respond to MyChart messages within 1-2 business days.  For prescription refills, please ask your pharmacy to contact our office. Our fax number is [phone].  If you have an urgent issue when the clinic is closed that cannot wait until the next business day, you can page your doctor at the number below.    Please note that while we do our best to be available for urgent issues outside of office hours, we are not available 24/7.   If you have an urgent issue and are unable to reach us , you may choose to seek medical care at your doctor's office, retail clinic, urgent care center, or emergency room.  If you have a medical emergency, please immediately call 911 or go to the emergency department.  Pager Numbers  - Dr. Hester: [phone]  - Dr. Jackquline: [phone]  - Dr. Claudene: [phone]   In the event of inclement weather, please call our main line at [phone] for an update on the status of any delays or closures.  Dermatology Medication Tips: Please keep the boxes that topical medications come in in order to help keep track of the instructions about where and how to use these. Pharmacies typically print the medication instructions only on the boxes and not directly on the medication tubes.   If your medication is too expensive, please contact our office at [phone] option 4 or send us  a message through MyChart.   We are unable to tell what your co-pay for medications will be in advance as this is different depending on your insurance coverage. However, we may be  able to find a substitute medication at lower cost or fill out paperwork to get insurance to cover a needed medication.   If a prior authorization is required to get your medication covered by your insurance company, please allow us  1-2 business days to complete this process.  Drug prices often vary depending on where the prescription is filled and some pharmacies may offer cheaper prices.  The website www.goodrx.com contains coupons for medications through different pharmacies. The prices here do not account for what the cost may be with help from insurance (it may be cheaper with your insurance), but the website can give you the price if you did not use any insurance.  - You can print the associated coupon and take it with your prescription to the pharmacy.  - You may also stop by our office during regular business hours and pick up a GoodRx coupon card.  - If you need your  prescription sent electronically to a different pharmacy, notify our office through [facility] or by phone at [phone] option 4.     Si Usted Necesita Algo Despus de Su Visita  Tambin puede enviarnos un mensaje a travs de Clinical cytogeneticist. Por lo general respondemos a los mensajes de MyChart en el transcurso de 1 a 2 das hbiles.  Para renovar recetas, por favor pida a su farmacia que se ponga en contacto con nuestra oficina. Randi lakes de fax es [place] [phone].  Si tiene un asunto urgente cuando la clnica est cerrada y que no puede esperar hasta el siguiente da hbil, puede llamar/localizar a su doctor(a) al nmero que aparece a continuacin.   Por favor, tenga en cuenta que aunque hacemos todo lo posible para estar disponibles para asuntos urgentes fuera del horario de [place], no estamos disponibles las 24 horas del da, los 7 [address]  [address] de la [place].   Si tiene un problema urgente y no puede comunicarse con nosotros, puede optar por buscar atencin mdica  en el consultorio de su doctor(a), en una clnica  privada, en un centro de atencin urgente o en una sala de emergencias.  Si tiene Engineer, drilling, por favor llame inmediatamente al 911 o vaya a la sala de emergencias.  Nmeros de bper  - Dr. Hester: [phone]  - Dra. Jackquline: [phone]  - Dr. Claudene: [phone]   En caso de inclemencias del tiempo, por favor llame a landry capes principal al [phone] para una actualizacin sobre el [place] de cualquier retraso o cierre.  Consejos para la medicacin en dermatologa: Por favor, guarde las cajas en las que vienen los medicamentos de uso tpico para ayudarle a seguir las instrucciones sobre dnde y cmo usarlos. Las farmacias generalmente imprimen las instrucciones del medicamento slo en las cajas y no directamente en los tubos del [place].   Si su medicamento es muy caro, por favor, pngase en contacto con landry rieger llamando al [phone] y presione la opcin 4 o envenos un mensaje a travs de Clinical cytogeneticist.   No podemos decirle cul ser su copago por los medicamentos por adelantado ya que esto es diferente dependiendo de la cobertura de su seguro. Sin embargo, es posible que podamos encontrar un medicamento sustituto a Audiological scientist un formulario para que el seguro cubra el medicamento que se considera necesario.   Si se requiere una autorizacin previa para que su compaa de seguros malta su medicamento, por favor permtanos de 1 a 2 das hbiles para completar este proceso.  Los precios de los medicamentos varan con frecuencia dependiendo del Environmental consultant de dnde se surte la receta y alguna farmacias pueden ofrecer precios ms baratos.  El sitio web www.goodrx.com tiene cupones para medicamentos de Health and safety inspector. Los precios aqu no tienen en cuenta lo que podra costar con la ayuda del seguro (puede ser ms barato con su seguro), pero el sitio web puede darle el precio si no utiliz Tourist information centre manager.  - Puede imprimir el cupn correspondiente y  llevarlo con su receta a la farmacia.  - Tambin puede pasar por nuestra oficina durante el horario de atencin regular y Education officer, museum una tarjeta de cupones de GoodRx.  - Si necesita que su receta se enve electrnicamente a una farmacia diferente, informe a nuestra oficina a travs de MyChart de [place] o por telfono llamando al [phone] y presione la opcin 4.

## 2024-04-27 ENCOUNTER — Encounter: Payer: Self-pay | Attending: Family Medicine | Admitting: Dietician

## 2024-04-27 ENCOUNTER — Encounter: Payer: Self-pay | Admitting: Dietician

## 2024-04-27 DIAGNOSIS — E1142 Type 2 diabetes mellitus with diabetic polyneuropathy: Secondary | ICD-10-CM | POA: Diagnosis present

## 2024-04-27 NOTE — Progress Notes (Signed)
 Diabetes Self-Management Education  Visit Type: Follow-up  Appt. Start Time: 1400 Appt. End Time: 1440  04/27/2024  [name], identified by name and date of birth, is a 63 y.o. female with a diagnosis of Diabetes:  .   ASSESSMENT Pt reports going on a cruise and eating a lot of desserts and has found increased cravings for sweets since then, reports buying and preparing fruits for sweets during the day, usually has 2 servings daily. Pt reports goal of continuing to lose weight, states they are weighing daily, lost weight prior to cruise but gained it back, states they have continued to be successful with long term weight loss. Pt reports checking glucose randomly throughout the day, FBG 110-120 mg/dL, PPBG - 859-819 mg/dL Pt reports walking 30 minutes after eating most meal, walks for 30-45 minutes total, reports starting back at the gym 2 days a week 45 minutes. Pt reports starting a 1200 mg daily calcium supplement last month for leg cramps, reports no leg cramps since starting.  24 hr recall B- Boost, Belvita crackers L- Small bowl of pasta, sauteed spinach D- Baked potato, squash, green beans Bev- Water    Diabetes Self-Management Education - 04/27/24 1500       Visit Information   Visit Type Follow-up      Pre-Education Assessment   Patient understands the diabetes disease and treatment process. Comprehends key points    Patient understands incorporating nutritional management into lifestyle. Comprehends key points    Patient undertands incorporating physical activity into lifestyle. Comprehends key points    Patient understands using medications safely. N/A (comment)    Patient understands monitoring blood glucose, interpreting and using results Comprehends key points    Patient understands prevention, detection, and treatment of acute complications. Needs Review    Patient understands prevention, detection, and treatment of chronic complications. Compreheands key  points    Patient understands how to develop strategies to address psychosocial issues. Comprehends key points    Patient understands how to develop strategies to promote health/change behavior. Comprehends key points      Complications   How often do you check your blood sugar? 1-2 times/day    Fasting Blood glucose range (mg/dL) 29-870    Postprandial Blood glucose range (mg/dL) 869-820      Activity / Exercise   Activity / Exercise Type ADL's;Moderate (swimming / aerobic walking)    How many days per week do you exercise? 5    How many minutes per day do you exercise? 45    Total minutes per week of exercise 225      Patient Education   Disease Pathophysiology Explored patient's options for treatment of their diabetes    Healthy Eating Food label reading, portion sizes and measuring food.    Being Active Helped patient identify appropriate exercises in relation to his/her diabetes, diabetes complications and other health issue.    Monitoring Identified appropriate SMBG and/or A1C goals.    Diabetes Stress and Support Travel strategies      Individualized Goals (developed by patient)   Nutrition Follow meal plan discussed    Physical Activity Exercise 5-7 days per week    Medications Not Applicable    Monitoring  Test my blood glucose as discussed    Reducing Risk examine blood glucose patterns      Patient Self-Evaluation of Goals - Patient rates self as meeting previously set goals (% of time)   Nutrition 50 - 75 % (half of the time)  Physical Activity >75% (most of the time)    Medications Not Applicable    Monitoring >75% (most of the time)    Problem Solving and behavior change strategies  50 - 75 % (half of the time)    Reducing Risk (treating acute and chronic complications) 50 - 75 % (half of the time)    Health Coping 50 - 75 % (half of the time)      Post-Education Assessment   Patient understands the diabetes disease and treatment process. Demonstrates  understanding / competency    Patient understands incorporating nutritional management into lifestyle. Demonstrates understanding / competency    Patient undertands incorporating physical activity into lifestyle. Demonstrates understanding / competency    Patient understands using medications safely. N/A    Patient understands monitoring blood glucose, interpreting and using results Demonstrates understanding / competency    Patient understands prevention, detection, and treatment of acute complications. Comprehends key points    Patient understands prevention, detection, and treatment of chronic complications. Demonstrates understanding / competency    Patient understands how to develop strategies to address psychosocial issues. Demonstrates understanding / competency    Patient understands how to develop strategies to promote health/change behavior. Demonstrates understanding / competency      Outcomes   Expected Outcomes Demonstrated interest in learning. Expect positive outcomes    Future DMSE 2 months    Program Status Not Completed      Subsequent Visit   Since your last visit have you continued or begun to take your medications as prescribed? Not on Medications    Since your last visit have you had your blood pressure checked? Yes    Is your most recent blood pressure lower, unchanged, or higher since your last visit? Lower    Since your last visit have you experienced any weight changes? Loss    Since your last visit, are you checking your blood glucose at least once a day? Yes          Individualized Plan for Diabetes Self-Management Training:   Learning Objective:  Patient will have a greater understanding of diabetes self-management. Patient education plan is to attend individual and/or group sessions per assessed needs and concerns.   Plan:   Patient Instructions  Focus on having a source of lean protein at every meal.  Choose Pure Protein, or Premiere Protein for high  protein, low carb options!  Read your nutrition labels and work on choosing low fat (<5g total fat, <2g saturated fat) options!  Remember the difference between your baseline activity level, and exercise! Find ways to increase your general activity level during the day!  Weight yourself once a week on Saturday morning!    Expected Outcomes:  Demonstrated interest in learning. Expect positive outcomes  Education material provided: Heart Healthy Label reading tips  If problems or questions, patient to contact team via:  Phone and Email  Future DSME appointment: 2 months

## 2024-04-27 NOTE — Patient Instructions (Addendum)
 Focus on having a source of lean protein at every meal.  Choose Pure Protein, or Premiere Protein for high protein, low carb options!  Read your nutrition labels and work on choosing low fat (<5g total fat, <2g saturated fat) options!  Remember the difference between your baseline activity level, and exercise! Find ways to increase your general activity level during the day!  Weight yourself once a week on Saturday morning!

## 2024-05-06 ENCOUNTER — Ambulatory Visit
Admission: RE | Admit: 2024-05-06 | Discharge: 2024-05-06 | Disposition: A | Discharge status: Home / Self Care | Source: Ambulatory Visit | Attending: Family Medicine | Admitting: Family Medicine

## 2024-05-06 DIAGNOSIS — Z780 Asymptomatic menopausal state: Secondary | ICD-10-CM | POA: Diagnosis present

## 2024-05-10 ENCOUNTER — Ambulatory Visit: Payer: Self-pay | Admitting: Family Medicine

## 2024-06-04 ENCOUNTER — Telehealth: Admitting: Physician Assistant

## 2024-06-04 DIAGNOSIS — R3989 Other symptoms and signs involving the genitourinary system: Secondary | ICD-10-CM

## 2024-06-04 MED ORDER — FLUCONAZOLE 150 MG PO TABS: 150.0000 mg | ORAL_TABLET | ORAL | 0 refills | Status: AC

## 2024-06-04 MED ORDER — CEPHALEXIN 500 MG PO CAPS
500.0000 mg | ORAL_CAPSULE | Freq: Two times a day (BID) | ORAL | 0 refills | Status: AC
Start: 2024-06-04 — End: 2024-06-11

## 2024-06-04 NOTE — Progress Notes (Signed)
 Virtual Visit Consent   Veronica Butler, you are scheduled for a virtual visit with a [facility] provider today. Just as with appointments in the office, your consent must be obtained to participate. Your consent will be active for this visit and any virtual visit you may have with one of our providers in the next 365 days. If you have a MyChart account, a copy of this consent can be sent to you electronically.  As this is a virtual visit, video technology does not allow for your provider to perform a traditional examination. This may limit your provider's ability to fully assess your condition. If your provider identifies any concerns that need to be evaluated in person or the need to arrange testing (such as labs, EKG, etc.), we will make arrangements to do so. Although advances in technology are sophisticated, we cannot ensure that it will always work on either your end or our end. If the connection with a video visit is poor, the visit may have to be switched to a telephone visit. With either a video or telephone visit, we are not always able to ensure that we have a secure connection.  By engaging in this virtual visit, you consent to the provision of healthcare and authorize for your insurance to be billed (if applicable) for the services provided during this visit. Depending on your insurance coverage, you may receive a charge related to this service.  I need to obtain your verbal consent now. Are you willing to proceed with your visit today? Veronica Butler has provided verbal consent on 06/04/2024 for a virtual visit (video or telephone). Veronica Butler, [place]  Date: 06/04/2024 11:01 AM   Virtual Visit via Video Note   I, Veronica Butler, connected with  Veronica Butler  ([id number], 1960-11-27) on 06/04/24 at 11:00 AM EDT by a video-enabled telemedicine application and verified that I am speaking with the correct person using two identifiers.  Location: Patient: Virtual Visit Location Patient:  Home Provider: Virtual Visit Location Provider: Home Office   I discussed the limitations of evaluation and management by telemedicine and the availability of in person appointments. The patient expressed understanding and agreed to proceed.    History of Present Illness: Veronica Butler is a 63 y.o. who identifies as a female who was assigned female at birth, and is being seen today for UTI symptoms.  HPI: Urinary Tract Infection  This is a new problem. The current episode started yesterday. The problem occurs every urination. The problem has been unchanged. The pain is mild. There is No history of pyelonephritis. Associated symptoms include frequency and urgency. She has tried nothing for the symptoms. The treatment provided no relief. Her past medical history is significant for recurrent UTIs.    Problems:  Patient Active Problem List   Diagnosis Date Noted   Polyp of colon 02/21/2023   Gastric erythema 02/21/2023   Drug-induced constipation 08/29/2022   Microalbuminuria due to type 2 diabetes mellitus (HCC) 08/29/2022   Hypertension associated with diabetes (HCC) 04/05/2021   Multinodular thyroid  05/19/2020   Bilateral leg cramps 03/17/2019   Obesity 08/03/2018   Internal hemorrhoids    Diverticulosis of large intestine without diverticulitis    Hypothyroidism 08/01/2017   Trigeminal neuralgia 08/01/2017   Diabetes (HCC) 08/22/2015   NASH (nonalcoholic steatohepatitis) 02/22/2015   CA cervix (HCC) 01/06/2015   Headache, migraine 01/06/2015   Neuropathy 01/06/2015   Apnea, sleep 01/06/2015   Chronic venous insufficiency 01/06/2015   Chronic urinary tract  infection 02/25/2009   Allergic rhinitis 01/06/2009   Hyperlipidemia associated with type 2 diabetes mellitus (HCC) 02/19/2008   B12 deficiency 08/23/2007   Acid reflux 08/23/2007   Cannot sleep 08/23/2007   Adaptive colitis 08/23/2007   Cyst of ovary 08/23/2007    Allergies:  Allergies  Allergen Reactions   Cortisone  Other (See Comments)    High blood sugar   Tape Swelling and Dermatitis    Redness and itching.   Ciprofloxacin Rash   Levaquin [Levofloxacin In D5w] Rash   Quinolones Rash    Tendon pain as well as rash   Medications:  Current Outpatient Medications:    ALPHA LIPOIC ACID PO, Take by mouth., Disp: , Rfl:    amoxicillin  (AMOXIL ) 500 MG capsule, Take 500 mg by mouth 2 (two) times daily., Disp: , Rfl:    Berberine Chloride (BERBERINE HCI PO), , Disp: , Rfl:    Blood Glucose Monitoring Suppl (ONE TOUCH ULTRA SYSTEM KIT) W/DEVICE KIT, GLUCOMETER TEST STRIPS - Historical Medication  Check blood sugar daily ---- ULTRA ONE TOUCH  Started 11-Jul-2009 Active Comments: DX: 790.29, Disp: , Rfl:    Boswellia Serrata (BOSWELLIA PO), Take by mouth., Disp: , Rfl:    Calcium Acetate, Phos Binder, (CALCIUM ACETATE PO), Take 300 mg by mouth in the morning and at bedtime., Disp: , Rfl:    COD LIVER OIL PO, Take by mouth., Disp: , Rfl:    Cranberry 500 MG CAPS, Take 1 capsule by mouth daily., Disp: , Rfl:    DIGESTIVE ENZYMES PO, Take 1 capsule by mouth daily., Disp: , Rfl:    hydrocortisone  2.5 % cream, Apply to rash on left arm twice daily until improved., Disp: 30 g, Rfl: 0   levothyroxine  (SYNTHROID ) 25 MCG tablet, TAKE 1 TABLET BY MOUTH DAILY BEFORE BREAKFAST AND 2 TABLETS BY MOUTH ON THE WEEKENDS, Disp: 117 tablet, Rfl: 1   MAGNESIUM PO, Take by mouth., Disp: , Rfl:    Milk Thistle-Turmeric (SILYMARIN PO), Take by mouth., Disp: , Rfl:    MULTIPLE VITAMIN PO, Take by mouth., Disp: , Rfl:    Omega-3 Fatty Acids (FISH OIL PO), Take by mouth., Disp: , Rfl:    Probiotic Product (CULTURELLE PROBIOTICS PO), Take by mouth., Disp: , Rfl:    Semaglutide ,0.25 or 0.5MG /DOS, (OZEMPIC , 0.25 OR 0.5 MG/DOSE,) 2 MG/3ML SOPN, Inject 0.5 mg into the skin once a week. (Patient not taking: Reported on 11/27/2023), Disp: 9 mL, Rfl: 1   tretinoin  (RETIN-A ) 0.025 % cream, Apply pea-sized amount to face at bedtime, wash off in  morning., Disp: 20 g, Rfl: 6   tretinoin  (RETIN-A ) 0.05 % cream, Apply topically at bedtime., Disp: 20 g, Rfl: 2   VITAMIN D PO, Take by mouth., Disp: , Rfl:   Observations/Objective: Patient is well-developed, well-nourished in no acute distress.  Resting comfortably  at home.  Head is normocephalic, atraumatic.  No labored breathing.  Speech is clear and coherent with logical content.  Patient is alert and oriented at baseline.    Assessment and Plan: 1. Suspected UTI (Primary)  Patient presenting with vaginal irritation most likely UTI.   I also considered PID, pregnancy, ectopic pregnancy, BV, endometriosis,  tubovarian abscess, appendicitis, yeast vaginitis,  and pyelonephritis, but this appears less likely considering the data gathered thus far.  Medication prescribed. I have instructed the patient to present to the nearest ER at any time if there are any new or worsening symptoms.  The patient expressed understanding of and agreement with  this plan.  Opportunity was given for questions prior to discharge and all stated questions were answered to the patient's satisfaction.   Follow Up Instructions: I discussed the assessment and treatment plan with the patient. The patient was provided an opportunity to ask questions and all were answered. The patient agreed with the plan and demonstrated an understanding of the instructions.  A copy of instructions were sent to the patient via MyChart unless otherwise noted below.    The patient was advised to call back or seek an in-person evaluation if the symptoms worsen or if the condition fails to improve as anticipated.    Veronica Shuck, PA-C

## 2024-06-04 NOTE — Patient Instructions (Signed)
 Veronica Butler, thank you for joining Teena Shuck, PA-C for today's virtual visit.  While this provider is not your primary care provider (PCP), if your PCP is located in our provider database this encounter information will be shared with them immediately following your visit.   A [facility] MyChart account gives you access to today's visit and all your visits, tests, and labs performed at [facility]  click here if you don't have a [place] MyChart account or go to [email or web address]  Consent: (Patient) Veronica Butler provided verbal consent for this virtual visit at the beginning of the encounter.  Current Medications:  Current Outpatient Medications:    cephALEXin  (KEFLEX ) 500 MG capsule, Take 1 capsule (500 mg total) by mouth 2 (two) times daily for 7 days., Disp: 14 capsule, Rfl: 0   fluconazole  (DIFLUCAN ) 150 MG tablet, Take 1 tablet (150 mg total) by mouth every 3 (three) days for 2 doses., Disp: 2 tablet, Rfl: 0   ALPHA LIPOIC ACID PO, Take by mouth., Disp: , Rfl:    Berberine Chloride (BERBERINE HCI PO), , Disp: , Rfl:    Blood Glucose Monitoring Suppl (ONE TOUCH ULTRA SYSTEM KIT) W/DEVICE KIT, GLUCOMETER TEST STRIPS - Historical Medication  Check blood sugar daily ---- ULTRA ONE TOUCH  Started 11-Jul-2009 Active Comments: DX: 790.29, Disp: , Rfl:    Boswellia Serrata (BOSWELLIA PO), Take by mouth., Disp: , Rfl:    Calcium Acetate, Phos Binder, (CALCIUM ACETATE PO), Take 300 mg by mouth in the morning and at bedtime., Disp: , Rfl:    COD LIVER OIL PO, Take by mouth., Disp: , Rfl:    Cranberry 500 MG CAPS, Take 1 capsule by mouth daily., Disp: , Rfl:    DIGESTIVE ENZYMES PO, Take 1 capsule by mouth daily., Disp: , Rfl:    hydrocortisone  2.5 % cream, Apply to rash on left arm twice daily until improved., Disp: 30 g, Rfl: 0   levothyroxine  (SYNTHROID ) 25 MCG tablet, TAKE 1 TABLET BY MOUTH DAILY BEFORE BREAKFAST AND 2 TABLETS BY MOUTH ON THE WEEKENDS, Disp: 117  tablet, Rfl: 1   MAGNESIUM PO, Take by mouth., Disp: , Rfl:    Milk Thistle-Turmeric (SILYMARIN PO), Take by mouth., Disp: , Rfl:    MULTIPLE VITAMIN PO, Take by mouth., Disp: , Rfl:    Omega-3 Fatty Acids (FISH OIL PO), Take by mouth., Disp: , Rfl:    Probiotic Product (CULTURELLE PROBIOTICS PO), Take by mouth., Disp: , Rfl:    Semaglutide ,0.25 or 0.5MG /DOS, (OZEMPIC , 0.25 OR 0.5 MG/DOSE,) 2 MG/3ML SOPN, Inject 0.5 mg into the skin once a week. (Patient not taking: Reported on 11/27/2023), Disp: 9 mL, Rfl: 1   tretinoin  (RETIN-A ) 0.025 % cream, Apply pea-sized amount to face at bedtime, wash off in morning., Disp: 20 g, Rfl: 6   tretinoin  (RETIN-A ) 0.05 % cream, Apply topically at bedtime., Disp: 20 g, Rfl: 2   VITAMIN D PO, Take by mouth., Disp: , Rfl:    Medications ordered in this encounter:  Meds ordered this encounter  Medications   cephALEXin  (KEFLEX ) 500 MG capsule    Sig: Take 1 capsule (500 mg total) by mouth 2 (two) times daily for 7 days.    Dispense:  14 capsule    Refill:  0    Supervising Provider:   LAMPTEY, PHILIP O [[id number]]   fluconazole  (DIFLUCAN ) 150 MG tablet    Sig: Take 1 tablet (150 mg total) by mouth every 3 (three) days for 2  doses.    Dispense:  2 tablet    Refill:  0    Supervising Provider:   LAMPTEY, PHILIP O [[id number]]     *If you need refills on other medications prior to your next appointment, please contact your pharmacy*  Follow-Up: Call back or seek an in-person evaluation if the symptoms worsen or if the condition fails to improve as anticipated.  [facility] [phone]  Other Instructions Please report to the nearest Emergency room with any worsening symptoms. Follow up with primary care provider (PCP) in 2 -3 days.    If you have been instructed to have an in-person evaluation today at a local Urgent Care facility, please use the link below. It will take you to a list of all of our available [facility],  including address, phone number and hours of operation. Please do not delay care.  [facility]  If you or a family member do not have a primary care provider, use the link below to schedule a visit and establish care. When you choose a [facility] primary care physician or advanced practice provider, you gain a long-term partner in health. Find a Primary Care Provider  Learn more about [facility]'s in-office and virtual care options: [facility] - Get Care Now

## 2024-06-18 ENCOUNTER — Telehealth: Payer: Self-pay | Admitting: Family Medicine

## 2024-06-18 ENCOUNTER — Ambulatory Visit: Payer: Self-pay

## 2024-06-18 NOTE — Telephone Encounter (Signed)
 Copied from CRM [id number]. Topic: Clinical - Pink Word Triage >> Jun 18, 2024  8:11 AM Rosaria E wrote: Reason for Triage: Pt has tested positive for a UTI, is currently travelling. Is requesting a Rx of  Cephalexin  500 mg Oral 2 times daily Amoxicillin  500 mg 2 times daily  Bladder pain   [id number] - [place], [place] - [address] AT [facility] OF [address] [facility] [address] [place] [address] Phone: [phone] Fax: [phone]

## 2024-06-18 NOTE — Telephone Encounter (Signed)
 FYI Only or Action Required?: Action required by provider: medication refill request.  Patient was last seen in primary care on 06/04/2024 by Rolan Berthold, PA-C.  Called Nurse Triage reporting Medication Problem.  Symptoms began yesterday.  Interventions attempted: OTC medications: Meds for UTI symptoms.  Symptoms are: gradually worsening.  Triage Disposition: See Physician Within 24 Hours  Patient/caregiver understands and will follow disposition?: No, wishes to speak with PCP     Copied from CRM [id number]. Topic: Clinical - Pink Word Triage >> Jun 18, 2024  8:11 AM Rosaria E wrote: Reason for Triage: Pt has tested positive for a UTI, is currently travelling. Is requesting a Rx of  Cephalexin  500 mg Oral 2 times daily Amoxicillin  500 mg 2 times daily   Bladder pain    [id number] - [place], [place] - [address] AT [facility] OF [address] [facility] [address] [place] [address] Phone: [phone] Fax: [phone] Reason for Disposition  Age > 50 years  Answer Assessment - Initial Assessment Questions Patient states she recently was on meds for a UTI and she believes it did not clear up symptoms. She took an at home test yesterday and tested positive for a UTI. She states the test showed she has leukocytes . Patient currently out of town and is requesting medication to be sent in to the pharmacy and requesting a call back regarding her request. Preferred pharmacy below.   [id number] - [place], [place] - [phone] AT [facility] OF [address] & [address] [phone] [place] [address] Phone: [phone] Fax: [phone]     1. SEVERITY: How bad is the pain?  (e.g., Scale 1-10; mild, moderate, or severe)     5/10 2. PATTERN: Is pain present every time you urinate or just sometimes?      Every time  3. ONSET: When did the painful urination start?      Yesterday  4.  FEVER: Do you have a fever? If Yes, ask: What is your temperature, how was it measured, and when did it start?     Last night felt feverish and had chills  5. PAST UTI: Have you had a urine infection before? If Yes, ask: When was the last time? and What happened that time?      Yes  6. CAUSE: What do you think is causing the painful urination?  (e.g., UTI, scratch, Herpes sore)     UTI  7. OTHER SYMPTOMS: Do you have any other symptoms? (e.g., blood in urine, flank pain, genital sores, urgency, vaginal discharge)     Lower back pain and urinary frequency  Protocols used: Urination Pain - Female-A-AH

## 2024-06-18 NOTE — Telephone Encounter (Signed)
 Just saw the other encounter, please disregard. Sorry!

## 2024-06-29 ENCOUNTER — Ambulatory Visit: Admitting: Family Medicine

## 2024-06-29 ENCOUNTER — Encounter: Payer: Self-pay | Admitting: Family Medicine

## 2024-06-29 VITALS — BP 127/85 | HR 93 | Wt 192.7 lb

## 2024-06-29 DIAGNOSIS — Z6830 Body mass index (BMI) 30.0-30.9, adult: Secondary | ICD-10-CM

## 2024-06-29 DIAGNOSIS — G629 Polyneuropathy, unspecified: Secondary | ICD-10-CM

## 2024-06-29 DIAGNOSIS — K7581 Nonalcoholic steatohepatitis (NASH): Secondary | ICD-10-CM | POA: Diagnosis not present

## 2024-06-29 DIAGNOSIS — I152 Hypertension secondary to endocrine disorders: Secondary | ICD-10-CM

## 2024-06-29 DIAGNOSIS — E1169 Type 2 diabetes mellitus with other specified complication: Secondary | ICD-10-CM

## 2024-06-29 DIAGNOSIS — R3989 Other symptoms and signs involving the genitourinary system: Secondary | ICD-10-CM

## 2024-06-29 DIAGNOSIS — E1129 Type 2 diabetes mellitus with other diabetic kidney complication: Secondary | ICD-10-CM

## 2024-06-29 DIAGNOSIS — E039 Hypothyroidism, unspecified: Secondary | ICD-10-CM

## 2024-06-29 DIAGNOSIS — E559 Vitamin D deficiency, unspecified: Secondary | ICD-10-CM

## 2024-06-29 DIAGNOSIS — Z23 Encounter for immunization: Secondary | ICD-10-CM | POA: Diagnosis not present

## 2024-06-29 DIAGNOSIS — E1142 Type 2 diabetes mellitus with diabetic polyneuropathy: Secondary | ICD-10-CM

## 2024-06-29 DIAGNOSIS — E1159 Type 2 diabetes mellitus with other circulatory complications: Secondary | ICD-10-CM | POA: Diagnosis not present

## 2024-06-29 DIAGNOSIS — E66811 Obesity, class 1: Secondary | ICD-10-CM

## 2024-06-29 NOTE — Progress Notes (Unsigned)
 Established patient visit   Patient: Veronica Butler   DOB: 10/22/1960   63 y.o. Female  MRN: [id number] Visit Date: 06/29/2024  Today's healthcare provider: Jon Eva, MD   Chief Complaint  Patient presents with   Medical Management of Chronic Issues   Hypothyroidism    Heat and cold intolerance   Pre-Diabetes    Excessive urinating   Subjective    HPI HPI     Hypothyroidism    Additional comments: Heat and cold intolerance        Pre-Diabetes    Additional comments: Excessive urinating      Last edited by Lilian Fitzpatrick, CMA on 06/29/2024  1:15 PM.       Discussed the use of AI scribe software for clinical note transcription with the patient, who gave verbal consent to proceed.  History of Present Illness   Veronica Butler is a 63 year old female with recurrent urinary tract infections and diabetes who presents for a follow-up visit.  She experiences recurrent urinary tract infections with bladder pain but no dysuria. Recently, she had two UTIs in quick succession, with the last one occurring in [place] . A culture was negative for bacteria but positive for leukocytes, and she was treated with Macrobid. She previously used Macrobid preventively after sexual activity. She suspects bladder pain may be related to irritation from acidic drinks like lemonade.  Her blood sugar levels occasionally exceed 200 mg/dL during episodes of bladder pain but are generally well-controlled, with most readings under 150 mg/dL. She manages her diabetes with dietary measures and regular physical activity, including walking after meals, and consults with a dietitian.  She takes calcium, vitamin D3, and B12 supplements, as well as daily cranberry supplements for UTI prevention. She is on Synthroid  for thyroid  management and reports stable blood pressure, though the diastolic number has been slightly elevated recently.  She experiences sciatica, affecting her sleep, and is  currently having a flare-up. She maintains an active lifestyle, going to the gym once or twice a week and walking daily.         Medications: Outpatient Medications Prior to Visit  Medication Sig   ALPHA LIPOIC ACID PO Take by mouth.   Berberine Chloride (BERBERINE HCI PO)    Blood Glucose Monitoring Suppl (ONE TOUCH ULTRA SYSTEM KIT) W/DEVICE KIT GLUCOMETER TEST STRIPS - Historical Medication  Check blood sugar daily ---- ULTRA ONE TOUCH  Started 11-Jul-2009 Active Comments: DX: 790.29   Calcium Acetate, Phos Binder, (CALCIUM ACETATE PO) Take 300 mg by mouth in the morning and at bedtime.   COD LIVER OIL PO Take by mouth.   Cranberry 500 MG CAPS Take 1 capsule by mouth daily.   DIGESTIVE ENZYMES PO Take 1 capsule by mouth daily.   hydrocortisone  2.5 % cream Apply to rash on left arm twice daily until improved.   levothyroxine  (SYNTHROID ) 25 MCG tablet TAKE 1 TABLET BY MOUTH DAILY BEFORE BREAKFAST AND 2 TABLETS BY MOUTH ON THE WEEKENDS   MAGNESIUM PO Take by mouth.   Milk Thistle-Turmeric (SILYMARIN PO) Take by mouth.   MULTIPLE VITAMIN PO Take by mouth.   Omega-3 Fatty Acids (FISH OIL PO) Take by mouth.   Probiotic Product (CULTURELLE PROBIOTICS PO) Take by mouth.   tretinoin  (RETIN-A ) 0.025 % cream Apply pea-sized amount to face at bedtime, wash off in morning.   VITAMIN D PO Take by mouth.   [DISCONTINUED] Boswellia Serrata (BOSWELLIA PO) Take by mouth.   [DISCONTINUED]  Semaglutide ,0.25 or 0.5MG /DOS, (OZEMPIC , 0.25 OR 0.5 MG/DOSE,) 2 MG/3ML SOPN Inject 0.5 mg into the skin once a week. (Patient not taking: Reported on 11/27/2023)   [DISCONTINUED] tretinoin  (RETIN-A ) 0.05 % cream Apply topically at bedtime.   No facility-administered medications prior to visit.    Review of Systems     Objective    BP 127/85 (BP Location: Left Arm, Patient Position: Sitting, Cuff Size: Normal)   Pulse 93   Wt 192 lb 11.2 oz (87.4 kg)   LMP 11/05/2000   SpO2 100%   BMI 30.18 kg/m     Physical Exam Vitals reviewed.  Constitutional:      General: She is not in acute distress.    Appearance: Normal appearance. She is well-developed. She is not diaphoretic.  HENT:     Head: Normocephalic and atraumatic.  Eyes:     General: No scleral icterus.    Conjunctiva/sclera: Conjunctivae normal.  Neck:     Thyroid : No thyromegaly.  Cardiovascular:     Rate and Rhythm: Normal rate and regular rhythm.     Heart sounds: Normal heart sounds. No murmur heard. Pulmonary:     Effort: Pulmonary effort is normal. No respiratory distress.     Breath sounds: Normal breath sounds. No wheezing, rhonchi or rales.  Musculoskeletal:     Cervical back: Neck supple.     Right lower leg: No edema.     Left lower leg: No edema.  Lymphadenopathy:     Cervical: No cervical adenopathy.  Skin:    General: Skin is warm and dry.     Findings: No rash.  Neurological:     Mental Status: She is alert and oriented to person, place, and time. Mental status is at baseline.  Psychiatric:        Mood and Affect: Mood normal.        Behavior: Behavior normal.      No results found for any visits on 06/29/24.  Assessment & Plan     Problem List Items Addressed This Visit       Cardiovascular and Mediastinum   Hypertension associated with diabetes (HCC) - Primary   Blood pressure is generally well-controlled with occasional diastolic readings creeping above 80 mmHg. Current management includes lifestyle modifications such as regular exercise and dietary sodium awareness.      Relevant Orders   Comprehensive metabolic panel with GFR     Digestive   NASH (nonalcoholic steatohepatitis)   Continue to monitor LFTs        Endocrine   Hyperlipidemia associated with type 2 diabetes mellitus (HCC)   Reviewed last lipid panel Not currently on a statin Recheck FLP and CMP Discussed diet and exercise - given diabetes, lower threshold for statin      Relevant Orders   Comprehensive  metabolic panel with GFR   Lipid panel   Diabetes (HCC)   Blood sugar levels are generally well-controlled with occasional spikes over 200 mg/dL, attributed to recent bladder pain episodes. A1c has been stable and within target range. Continues to manage blood sugar with diet and exercise, including walking 15-20 minutes after meals. - Order A1c test - Order kidney function test - Order liver function test - Order urine test for protein levels      Relevant Orders   Hemoglobin A1c   Hypothyroidism   Hypothyroidism is well-managed on current Synthroid  therapy. Thyroid  levels have been in a good range. Lifelong Synthroid  therapy is anticipated. - Order thyroid  function test  Relevant Orders   TSH   Microalbuminuria due to type 2 diabetes mellitus (HCC)   Will recheck UACR annually        Nervous and Auditory   Neuropathy   Relevant Orders   B12     Other   Obesity   Weight is stable between 188-190 lbs with ongoing efforts in diet and exercise. BMI is improving. Continues to engage in regular physical activity and dietary management      Other Visit Diagnoses       Immunization due       Relevant Orders   Flu vaccine HIGH DOSE PF(Fluzone Trivalent) (Completed)     Avitaminosis D       Relevant Orders   VITAMIN D 25 Hydroxy (Vit-D Deficiency, Fractures)     Bladder pain               Bladder pain/irritative bladder symptoms Recurrent bladder pain episodes with negative urine cultures, suggesting bladder irritation rather than infection. Possible triggers include acidic drinks like lemonade. Cranberry supplements are being taken daily. - Advise use of Azo for bladder pain relief - Encourage hydration to flush bladder - Monitor and avoid potential dietary irritants  General Health Maintenance Received flu shot during this visit. Immunizations are up to date. - Order calcium level test - Order vitamin D level test - Order cholesterol test - Order B12 level  test       Return in about 6 months (around 12/28/2024) for chronic disease f/u.       Jon Eva, MD  [facility] [phone] (phone) [phone] (fax)  [facility]

## 2024-06-30 NOTE — Assessment & Plan Note (Signed)
 Will recheck UACR annually

## 2024-06-30 NOTE — Assessment & Plan Note (Signed)
 Weight is stable between 188-190 lbs with ongoing efforts in diet and exercise. BMI is improving. Continues to engage in regular physical activity and dietary management

## 2024-06-30 NOTE — Assessment & Plan Note (Signed)
 Hypothyroidism is well-managed on current Synthroid  therapy. Thyroid  levels have been in a good range. Lifelong Synthroid  therapy is anticipated. - Order thyroid  function test

## 2024-06-30 NOTE — Assessment & Plan Note (Signed)
Continue to monitor LFTs 

## 2024-06-30 NOTE — Assessment & Plan Note (Signed)
 Blood pressure is generally well-controlled with occasional diastolic readings creeping above 80 mmHg. Current management includes lifestyle modifications such as regular exercise and dietary sodium awareness.

## 2024-06-30 NOTE — Assessment & Plan Note (Signed)
 Reviewed last lipid panel Not currently on a statin Recheck FLP and CMP Discussed diet and exercise - given diabetes, lower threshold for statin

## 2024-06-30 NOTE — Assessment & Plan Note (Signed)
 Blood sugar levels are generally well-controlled with occasional spikes over 200 mg/dL, attributed to recent bladder pain episodes. A1c has been stable and within target range. Continues to manage blood sugar with diet and exercise, including walking 15-20 minutes after meals. - Order A1c test - Order kidney function test - Order liver function test - Order urine test for protein levels

## 2024-07-03 ENCOUNTER — Other Ambulatory Visit: Payer: Self-pay | Admitting: Family Medicine

## 2024-07-03 LAB — TSH: TSH: 1.53 u[IU]/mL (ref 0.450–4.500)

## 2024-07-03 LAB — VITAMIN B12: Vitamin B-12: 816 pg/mL (ref 232–1245)

## 2024-07-03 LAB — COMPREHENSIVE METABOLIC PANEL WITH GFR
ALT: 22 IU/L (ref 0–32)
AST: 27 IU/L (ref 0–40)
Albumin: 4.4 g/dL (ref 3.9–4.9)
Alkaline Phosphatase: 71 IU/L (ref 49–135)
BUN/Creatinine Ratio: 19 (ref 12–28)
BUN: 18 mg/dL (ref 8–27)
Bilirubin Total: 0.6 mg/dL (ref 0.0–1.2)
CO2: 24 mmol/L (ref 20–29)
Calcium: 9.6 mg/dL (ref 8.7–10.3)
Chloride: 102 mmol/L (ref 96–106)
Creatinine, Ser: 0.95 mg/dL (ref 0.57–1.00)
Globulin, Total: 2 g/dL (ref 1.5–4.5)
Glucose: 121 mg/dL — ABNORMAL HIGH (ref 70–99)
Potassium: 4.9 mmol/L (ref 3.5–5.2)
Sodium: 141 mmol/L (ref 134–144)
Total Protein: 6.4 g/dL (ref 6.0–8.5)
eGFR: 66 mL/min/1.73 (ref >59)

## 2024-07-03 LAB — VITAMIN D 25 HYDROXY (VIT D DEFICIENCY, FRACTURES): Vit D, 25-Hydroxy: 46.4 ng/mL (ref 30.0–100.0)

## 2024-07-03 LAB — LIPID PANEL
Chol/HDL Ratio: 4.1 ratio (ref 0.0–4.4)
Cholesterol, Total: 195 mg/dL (ref 100–199)
HDL: 48 mg/dL (ref >39)
LDL Chol Calc (NIH): 124 mg/dL — ABNORMAL HIGH (ref 0–99)
Triglycerides: 128 mg/dL (ref 0–149)
VLDL Cholesterol Cal: 23 mg/dL (ref 5–40)

## 2024-07-03 LAB — HEMOGLOBIN A1C
Est. average glucose Bld gHb Est-mCnc: 120 mg/dL
Hgb A1c MFr Bld: 5.8 % — ABNORMAL HIGH (ref 4.8–5.6)

## 2024-07-05 ENCOUNTER — Ambulatory Visit: Payer: Self-pay | Admitting: Family Medicine

## 2024-07-08 ENCOUNTER — Encounter: Attending: Family Medicine | Admitting: Dietician

## 2024-07-08 ENCOUNTER — Encounter: Payer: Self-pay | Admitting: Dietician

## 2024-07-08 DIAGNOSIS — E1142 Type 2 diabetes mellitus with diabetic polyneuropathy: Secondary | ICD-10-CM | POA: Insufficient documentation

## 2024-07-08 NOTE — Progress Notes (Signed)
 Diabetes Self-Management Education  Visit Type: Follow-up  Appt. Start Time: 1535 Appt. End Time: 1610  07/08/2024  [name], identified by name and date of birth, is a 63 y.o. female with a diagnosis of Diabetes:  .   ASSESSMENT  Pt reports a couple recent bouts of hyperglycemia in the evenings (>200 mg/dL), has dealt with multiple bladder infections and sciatica pain returned, pt reports will be starting PT fairly soon. Pt states they have also traveled multiple times over the past couple of months, states they have been eating restaurant food a lot while away from home but trying to remain conscious of nutrition concerns, Pt reports reading more labels now, working on reducing fat/saturated fats, trying to incorporate lean protein at each meal. Pt states they still feel like they are not eating enough vegetables. Pt reports they are religious about walking after each meal, also exercised in hotel gym while on vacation.   Diabetes Self-Management Education - 07/08/24 1600       Visit Information   Visit Type Follow-up      Pre-Education Assessment   Patient understands the diabetes disease and treatment process. Comprehends key points    Patient understands incorporating nutritional management into lifestyle. Comprehends key points    Patient undertands incorporating physical activity into lifestyle. Demonstrates understanding / competency    Patient understands using medications safely. N/A (comment)    Patient understands monitoring blood glucose, interpreting and using results Comprehends key points    Patient understands prevention, detection, and treatment of acute complications. Comprehends key points    Patient understands prevention, detection, and treatment of chronic complications. Compreheands key points    Patient understands how to develop strategies to address psychosocial issues. Comprehends key points    Patient understands how to develop strategies to promote  health/change behavior. Comprehends key points      Complications   Last HgB A1C per patient/outside source 5.8 %   07/02/2024   How often do you check your blood sugar? 1-2 times/day    Fasting Blood glucose range (mg/dL) 29-870    Postprandial Blood glucose range (mg/dL) 869-820;>799    Number of hyperglycemic episodes ( >200mg /dL): Rare    Can you tell when your blood sugar is high? No      Dietary Intake   Breakfast Smoothie (chocolate plant protein, spirulina, spinach frozen peaches, blueberries, collagen, 2 Belvita bites    Lunch 2 egg whites    Dinner Ham and cheese sandwich on thin sourdough w/ apple slices    Beverage(s) Water       Activity / Exercise   Activity / Exercise Type ADL's;Moderate (swimming / aerobic walking)    How many days per week do you exercise? 7    How many minutes per day do you exercise? 45    Total minutes per week of exercise 315      Patient Education   Healthy Eating Information on hints to eating out and maintain blood glucose control.;Meal options for control of blood glucose level and chronic complications.    Being Active Role of exercise on diabetes management, blood pressure control and cardiac health.    Monitoring Identified appropriate SMBG and/or A1C goals.    Diabetes Stress and Support Travel strategies      Individualized Goals (developed by patient)   Nutrition Follow meal plan discussed    Physical Activity Exercise 5-7 days per week    Medications Not Applicable    Monitoring  Test my blood glucose  as discussed    Reducing Risk examine blood glucose patterns      Patient Self-Evaluation of Goals - Patient rates self as meeting previously set goals (% of time)   Nutrition 50 - 75 % (half of the time)    Physical Activity >75% (most of the time)    Medications Not Applicable    Monitoring >75% (most of the time)    Problem Solving and behavior change strategies  >75% (most of the time)    Reducing Risk (treating acute and  chronic complications) 25 - 50% (sometimes)   Hyperglycemia   Health Coping >75% (most of the time)      Post-Education Assessment   Patient understands the diabetes disease and treatment process. Demonstrates understanding / competency    Patient understands incorporating nutritional management into lifestyle. Demonstrates understanding / competency    Patient undertands incorporating physical activity into lifestyle. Demonstrates understanding / competency    Patient understands using medications safely. N/A    Patient understands monitoring blood glucose, interpreting and using results Comprehends key points    Patient understands prevention, detection, and treatment of acute complications. Comprehends key points    Patient understands prevention, detection, and treatment of chronic complications. Demonstrates understanding / competency    Patient understands how to develop strategies to address psychosocial issues. Demonstrates understanding / competency    Patient understands how to develop strategies to promote health/change behavior. Demonstrates understanding / competency      Outcomes   Expected Outcomes Demonstrated interest in learning. Expect positive outcomes    Future DMSE 3-4 months    Program Status Not Completed      Subsequent Visit   Since your last visit have you continued or begun to take your medications as prescribed? Not on Medications    Since your last visit have you had your blood pressure checked? Yes    Is your most recent blood pressure lower, unchanged, or higher since your last visit? Higher    Since your last visit have you experienced any weight changes? Loss    Since your last visit, are you checking your blood glucose at least once a day? Yes          Individualized Plan for Diabetes Self-Management Training:   Learning Objective:  Patient will have a greater understanding of diabetes self-management. Patient education plan is to attend individual  and/or group sessions per assessed needs and concerns.   Plan:   Patient Instructions  Be sure to try [employer] in [employer]!  Continue to great work being active by walking after meals!!  Continue to monitor your saturated fats by limiting creams, cheeses, baked goods, and high fat meats.  Consider working towards adding more vegetables by logging your food choices to see where they may be lacking!    Expected Outcomes:  Demonstrated interest in learning. Expect positive outcomes  If problems or questions, patient to contact team via:  Phone and Email  Future DSME appointment: 3-4 months

## 2024-07-08 NOTE — Patient Instructions (Addendum)
 Be sure to try [employer]!  Continue to great work being active by walking after meals!!  Continue to monitor your saturated fats by limiting creams, cheeses, baked goods, and high fat meats.  Consider working towards adding more vegetables by logging your food choices to see where they may be lacking!

## 2024-09-29 ENCOUNTER — Telehealth: Admitting: Physician Assistant

## 2024-09-29 DIAGNOSIS — R3989 Other symptoms and signs involving the genitourinary system: Secondary | ICD-10-CM

## 2024-09-30 ENCOUNTER — Other Ambulatory Visit: Payer: Self-pay

## 2024-09-30 ENCOUNTER — Ambulatory Visit: Admitting: Internal Medicine

## 2024-09-30 ENCOUNTER — Ambulatory Visit: Payer: Self-pay

## 2024-09-30 VITALS — BP 142/86 | HR 84 | Temp 98.1°F | Resp 16 | Wt 194.3 lb

## 2024-09-30 DIAGNOSIS — T3695XA Adverse effect of unspecified systemic antibiotic, initial encounter: Secondary | ICD-10-CM

## 2024-09-30 DIAGNOSIS — Z87440 Personal history of urinary (tract) infections: Secondary | ICD-10-CM

## 2024-09-30 DIAGNOSIS — B379 Candidiasis, unspecified: Secondary | ICD-10-CM

## 2024-09-30 DIAGNOSIS — M545 Low back pain, unspecified: Secondary | ICD-10-CM

## 2024-09-30 DIAGNOSIS — R102 Pelvic and perineal pain unspecified side: Secondary | ICD-10-CM

## 2024-09-30 LAB — POCT URINALYSIS DIPSTICK
Bilirubin, UA: NEGATIVE
Blood, UA: NEGATIVE
Color, UA: NEGATIVE
Glucose, UA: NEGATIVE
Ketones, UA: NEGATIVE
Nitrite, UA: NEGATIVE
Protein, UA: NEGATIVE
Spec Grav, UA: 1.02
Urobilinogen, UA: 0.2 U/dL
pH, UA: 5

## 2024-09-30 MED ORDER — NITROFURANTOIN MONOHYD MACRO 100 MG PO CAPS: 100.0000 mg | ORAL_CAPSULE | Freq: Two times a day (BID) | ORAL | 0 refills | Status: AC

## 2024-09-30 MED ORDER — FLUCONAZOLE 150 MG PO TABS: 150.0000 mg | ORAL_TABLET | Freq: Once | ORAL | 0 refills | Status: AC

## 2024-09-30 NOTE — Telephone Encounter (Signed)
 FYI Only or Action Required?: FYI only for provider: appointment scheduled on 09/30/24.  Patient was last seen in primary care on 06/29/2024 by Myrla Jon HERO, MD.  Called Nurse Triage reporting Urinary Tract Infection.  Symptoms began several days ago.  Interventions attempted: Nothing.  Symptoms are: unchanged.  Triage Disposition: See Physician Within 24 Hours  Patient/caregiver understands and will follow disposition?: Yes   Copied from CRM #[id number]. Topic: Clinical - Red Word Triage >> Sep 30, 2024  8:40 AM Veronica Butler wrote: Red Word that prompted transfer to Nurse Triage: Patient states she did a at home UTI test and cam back positive. Symptoms include sensitive bladder and lower back pain. Reason for Disposition  Urinating more frequently than usual (i.e., frequency) OR new-onset of the feeling of an urgent need to urinate (i.e., urgency)  Answer Assessment - Initial Assessment Questions Scheduled 09/30/24 Advised call back or ED/911 if symptoms worsen. Patient verbalized understanding.    1. SYMPTOM: What's the main symptom you're concerned about? (e.g., frequency, incontinence)     Sensitive bladdar, there constantly, I can tell something off, no pain 2. ONSET: When did the    start?     yesterday 3. PAIN: Is there any pain? If Yes, ask: How bad is it? (Scale: 1-10; mild, moderate, severe)     0/10 4. CAUSE: What do you think is causing the symptoms?     uti 5. OTHER SYMPTOMS: Do you have any other symptoms? (e.g., blood in urine, fever, flank pain, pain with urination)     Lower back pain  Protocols used: Urinary Symptoms-A-AH

## 2024-09-30 NOTE — Progress Notes (Signed)
 "  Acute Office Visit  Subjective:     Patient ID: Veronica Butler, female    DOB: [birth year], 64 y.o.   MRN: [id number]  Chief Complaint  Patient presents with   Back Pain    Low back pain, at home urine test positive    Back Pain Pertinent negatives include no dysuria or fever.   Patient is in today for low back pain. This is my first time meeting her.   Discussed the use of AI scribe software for clinical note transcription with the patient, who gave verbal consent to proceed.  History of Present Illness Veronica Butler is a 64 year old female with recurrent urinary tract infections who presents with urinary symptoms and lower back pain.  She developed urinary pressure and bilateral lower back pain last night. She denies dysuria or fever, but felt internal shakiness the night before last. The back pain is not localized to one side.  She has recurrent UTIs. The last episode was in October while traveling and was treated successfully with Macrobid . She is allergic to Cipro and is reluctant to take sulfa antibiotics because of her mother's allergy. Macrobid  has resolved her UTIs in the past.  She had four episodes of diarrhea starting at 4 AM the day before yesterday. She currently has no fever, hematuria, or known proteinuria. Today's urine dip showed trace leukocytes and was negative for nitrites.  She has been running errands for her mother in [place] and feels this led to dehydration and her current symptoms. She is not currently sexually active.   Review of Systems  Constitutional:  Negative for chills and fever.  Genitourinary:  Negative for dysuria, flank pain, frequency, hematuria and urgency.  Musculoskeletal:  Positive for back pain.        Objective:    BP (!) 142/86 (Cuff Size: Large)   Pulse 84   Temp 98.1 F (36.7 C) (Oral)   Resp 16   Wt 194 lb 4.8 oz (88.1 kg)   LMP 11/05/2000   SpO2 98%   BMI 30.43 kg/m  BP Readings from Last 3 Encounters:  09/30/24  (!) 142/86  06/29/24 127/85  03/29/24 120/86   Wt Readings from Last 3 Encounters:  09/30/24 194 lb 4.8 oz (88.1 kg)  06/29/24 192 lb 11.2 oz (87.4 kg)  03/29/24 188 lb 14.4 oz (85.7 kg)      Physical Exam Constitutional:      Appearance: Normal appearance.  HENT:     Head: Normocephalic and atraumatic.  Eyes:     Conjunctiva/sclera: Conjunctivae normal.  Cardiovascular:     Rate and Rhythm: Normal rate and regular rhythm.  Pulmonary:     Effort: Pulmonary effort is normal.     Breath sounds: Normal breath sounds.  Abdominal:     Tenderness: There is no right CVA tenderness or left CVA tenderness.  Skin:    General: Skin is warm and dry.  Neurological:     General: No focal deficit present.     Mental Status: She is alert. Mental status is at baseline.  Psychiatric:        Mood and Affect: Mood normal.        Behavior: Behavior normal.     No results found for any visits on 09/30/24.      Assessment & Plan:   Assessment & Plan Recurrent urinary tract infection Urinalysis indicated nonspecific inflammation. Culture results pending. Previous UTIs treated with Macrobid . - Prescribed Macrobid  for 5 days. -  Sent urine sample for culture. - Advised Macrobid  use if symptoms worsen, especially during travel. - Instructed to report severe unilateral back pain, fever, or hematuria. - Discussed preventive measures: hydration, probiotics, breathable undergarments, urination post-intercourse. - Consider vaginal estrogen or urogynecologist consultation for recurrent UTIs.  Antibiotic-induced vulvovaginal candidiasis - Discuss with gynecologist about vaginal creams or estrogen to maintain vaginal health and potentially reduce UTI frequency.  - POCT urinalysis dipstick - Urine Culture - nitrofurantoin , macrocrystal-monohydrate, (MACROBID ) 100 MG capsule; Take 1 capsule (100 mg total) by mouth 2 (two) times daily for 5 days.  Dispense: 10 capsule; Refill: 0 - fluconazole   (DIFLUCAN ) 150 MG tablet; Take 1 tablet (150 mg total) by mouth once for 1 dose.  Dispense: 3 tablet; Refill: 0   Return if symptoms worsen or fail to improve.  Veronica Fischer, DO   "

## 2024-09-30 NOTE — Progress Notes (Signed)
" °  Because of increased risk of antibiotic-resistant UTI in those over 65, the standard of care if for a urinalysis/urine culture be obtained and exam performed before starting antibiotics. As such, I feel your condition warrants further evaluation and I recommend that you be seen in a face-to-face visit.   NOTE: There will be NO CHARGE for this E-Visit   If you are having a true medical emergency, please call 911.     For an urgent face to face visit, [facility] [facility] has multiple urgent care centers for your convenience.  Click the link below for the full list of locations and hours, walk-in wait times, appointment scheduling options and driving directions:  Urgent Care - [place], [place], [place], [place], [place], [place]  [place]     Your MyChart E-visit questionnaire answers were reviewed by a board certified advanced clinical practitioner to complete your personal care plan based on your specific symptoms.    Thank you for using e-Visits.    "

## 2024-10-01 LAB — URINE CULTURE
MICRO NUMBER:: 17474102
Result:: NO GROWTH
SPECIMEN QUALITY:: ADEQUATE

## 2024-10-01 NOTE — Telephone Encounter (Signed)
 Noted

## 2024-10-04 ENCOUNTER — Ambulatory Visit: Payer: Self-pay | Admitting: Internal Medicine

## 2024-10-06 ENCOUNTER — Encounter: Payer: Self-pay | Admitting: Dietician

## 2024-10-06 ENCOUNTER — Encounter: Attending: Family Medicine | Admitting: Dietician

## 2024-10-06 DIAGNOSIS — E1142 Type 2 diabetes mellitus with diabetic polyneuropathy: Secondary | ICD-10-CM | POA: Insufficient documentation

## 2024-10-06 DIAGNOSIS — Z713 Dietary counseling and surveillance: Secondary | ICD-10-CM | POA: Insufficient documentation

## 2024-10-06 DIAGNOSIS — I52 Other heart disorders in diseases classified elsewhere: Secondary | ICD-10-CM | POA: Insufficient documentation

## 2024-10-06 DIAGNOSIS — E1159 Type 2 diabetes mellitus with other circulatory complications: Secondary | ICD-10-CM | POA: Insufficient documentation

## 2024-10-06 NOTE — Progress Notes (Signed)
 Diabetes Self-Management Education  Visit Type: Follow-up  Appt. Start Time: 1520 Appt. End Time: 1550  10/06/2024  Veronica Butler, identified by name and date of birth, is a 64 y.o. female with a diagnosis of Diabetes:  .   ASSESSMENT Dietetic Intern, Zakkiyya Yasin, present for appointment Pt reports doing well overall, states they were able to stick to their diet over Thanksgiving, but ate a little more sweets/baked goods over Christmas. Pt reports reading more labels for saturated fat content, reduced Belvita cookies from 4 to 2 in the mornings, drinking >64 oz of plain water  daily, snacking on fruits, peanuts, and BUILT protein bars. Pt reports mother recently hospitalized, states it has been a high stress period of time and their glucose values have been a little more erratic. Pt reports finishing PT for sciatica, got some relief (80%), still gets woken up with pain at night, reports using [place] gel that reduces nerve pain at night. Pt reports that they are still walking for 15-20 minutes after meals, going to the gym 2 days a week for 45-60 (cardio, full body resistance exercise)   Diabetes Self-Management Education - 10/06/24 1603       Visit Information   Visit Type Follow-up      Psychosocial Assessment   Patient Belief/Attitude about Diabetes Motivated to manage diabetes    Other persons present Other (comment)   Intern   Learning Readiness Change in progress      Pre-Education Assessment   Patient understands the diabetes disease and treatment process. Comprehends key points    Patient understands incorporating nutritional management into lifestyle. Comprehends key points    Patient undertands incorporating physical activity into lifestyle. Comprehends key points    Patient understands using medications safely. N/A (comment)    Patient understands monitoring blood glucose, interpreting and using results Comprehends key points    Patient understands prevention,  detection, and treatment of acute complications. Comprehends key points    Patient understands prevention, detection, and treatment of chronic complications. Compreheands key points    Patient understands how to develop strategies to address psychosocial issues. Comprehends key points    Patient understands how to develop strategies to promote health/change behavior. Comprehends key points      Complications   How often do you check your blood sugar? 3-4 times/day    Fasting Blood glucose range (mg/dL) 29-870    Postprandial Blood glucose range (mg/dL) 869-820;819-799      Dietary Intake   Breakfast oatmeal w/ fruit (berries, orange, grapes)    Lunch 1 slice sourdough toast, pimento cheese, apple    Dinner 2 scrambled eggs, grits, 1/2 english muffin w/ butter (plant based) and blueberry jam    Beverage(s) Water       Activity / Exercise   Activity / Exercise Type ADL's;Light (walking / raking leaves);Moderate (swimming / aerobic walking)   Walks after meals daily   How many days per week do you exercise? 2    How many minutes per day do you exercise? 60    Total minutes per week of exercise 120      Patient Education   Disease Pathophysiology Explored patient's options for treatment of their diabetes    Healthy Eating Role of diet in the treatment of diabetes and the relationship between the three main macronutrients and blood glucose level;Meal options for control of blood glucose level and chronic complications.;Reviewed blood glucose goals for pre and post meals and how to evaluate the patients' food intake on their  blood glucose level.    Being Active Helped patient identify appropriate exercises in relation to his/her diabetes, diabetes complications and other health issue.;Role of exercise on diabetes management, blood pressure control and cardiac health.    Monitoring Identified appropriate SMBG and/or A1C goals.    Chronic complications Assessed and discussed foot care and  prevention of foot problems;Relationship between chronic complications and blood glucose control    Diabetes Stress and Support Role of stress on diabetes      Individualized Goals (developed by patient)   Nutrition General guidelines for healthy choices and portions discussed;Follow meal plan discussed    Physical Activity Exercise 5-7 days per week    Medications Not Applicable    Monitoring  Test my blood glucose as discussed    Reducing Risk examine blood glucose patterns      Patient Self-Evaluation of Goals - Patient rates self as meeting previously set goals (% of time)   Nutrition >75% (most of the time)    Physical Activity >75% (most of the time)    Medications Not Applicable    Monitoring >75% (most of the time)    Problem Solving and behavior change strategies  >75% (most of the time)    Reducing Risk (treating acute and chronic complications) 50 - 75 % (half of the time)    Health Coping 50 - 75 % (half of the time)      Post-Education Assessment   Patient understands the diabetes disease and treatment process. Demonstrates understanding / competency    Patient understands incorporating nutritional management into lifestyle. Demonstrates understanding / competency    Patient undertands incorporating physical activity into lifestyle. Demonstrates understanding / competency    Patient understands using medications safely. N/A    Patient understands monitoring blood glucose, interpreting and using results Demonstrates understanding / competency    Patient understands prevention, detection, and treatment of acute complications. Demonstrates understanding / competency    Patient understands prevention, detection, and treatment of chronic complications. Comprehends key points    Patient understands how to develop strategies to address psychosocial issues. Demonstrates understanding / competency    Patient understands how to develop strategies to promote health/change behavior.  Demonstrates understanding / competency      Outcomes   Expected Outcomes Demonstrated interest in learning. Expect positive outcomes    Future DMSE 3-4 months    Program Status Not Completed      Subsequent Visit   Since your last visit have you continued or begun to take your medications as prescribed? Not on Medications    Since your last visit have you had your blood pressure checked? Yes    Is your most recent blood pressure lower, unchanged, or higher since your last visit? Higher   Stress a/w mother in ED   Since your last visit have you experienced any weight changes? Loss    Weight Loss (lbs) 4    Since your last visit, are you checking your blood glucose at least once a day? Yes          Individualized Plan for Diabetes Self-Management Training:   Learning Objective:  Patient will have a greater understanding of diabetes self-management. Patient education plan is to attend individual and/or group sessions per assessed needs and concerns.   Plan:   Patient Instructions  Keep up the good work exercising and walking after meals!!  Check your blood sugar each morning before eating or drinking (fasting). Look for numbers to become consistently under 100 mg/dL  Check your blood sugar 2 hours after you begin eating a meal. Look for numbers under 180 mg/dL at all times.  Continue reading your labels for saturated fats, look for 1 gram or less per serving.  Havre a palm full of nuts/seeds as a snack!      Expected Outcomes:  Demonstrated interest in learning. Expect positive outcomes  If problems or questions, patient to contact team via:  Phone and Email  Future DSME appointment: 3-4 months

## 2024-10-06 NOTE — Patient Instructions (Addendum)
 Keep up the good work exercising and walking after meals!!  Check your blood sugar each morning before eating or drinking (fasting). Look for numbers to become consistently under 100 mg/dL Check your blood sugar 2 hours after you begin eating a meal. Look for numbers under 180 mg/dL at all times.  Continue reading your labels for saturated fats, look for 1 gram or less per serving.  Havre a palm full of nuts/seeds as a snack!

## 2024-10-08 ENCOUNTER — Other Ambulatory Visit: Payer: Self-pay

## 2024-10-08 ENCOUNTER — Emergency Department
Admission: EM | Admit: 2024-10-08 | Discharge: 2024-10-09 | Disposition: A | Attending: Emergency Medicine | Admitting: Emergency Medicine

## 2024-10-08 ENCOUNTER — Emergency Department

## 2024-10-08 DIAGNOSIS — R0789 Other chest pain: Secondary | ICD-10-CM | POA: Diagnosis present

## 2024-10-08 DIAGNOSIS — M25512 Pain in left shoulder: Secondary | ICD-10-CM | POA: Diagnosis not present

## 2024-10-08 LAB — CBC
HCT: 39.9 % (ref 36.0–46.0)
Hemoglobin: 13.5 g/dL (ref 12.0–15.0)
MCH: 29.9 pg (ref 26.0–34.0)
MCHC: 33.8 g/dL (ref 30.0–36.0)
MCV: 88.3 fL (ref 80.0–100.0)
Platelets: 227 K/uL (ref 150–400)
RBC: 4.52 MIL/uL (ref 3.87–5.11)
RDW: 12.3 % (ref 11.5–15.5)
WBC: 7.9 K/uL (ref 4.0–10.5)
nRBC: 0 % (ref 0.0–0.2)

## 2024-10-08 LAB — BASIC METABOLIC PANEL WITH GFR
Anion gap: 12 (ref 5–15)
BUN: 18 mg/dL (ref 8–23)
CO2: 25 mmol/L (ref 22–32)
Calcium: 9.7 mg/dL (ref 8.9–10.3)
Chloride: 105 mmol/L (ref 98–111)
Creatinine, Ser: 0.76 mg/dL (ref 0.44–1.00)
GFR, Estimated: >60 mL/min
Glucose, Bld: 169 mg/dL — ABNORMAL HIGH (ref 70–99)
Potassium: 3.7 mmol/L (ref 3.5–5.1)
Sodium: 142 mmol/L (ref 135–145)

## 2024-10-08 LAB — TROPONIN T, HIGH SENSITIVITY: Troponin T High Sensitivity: <6 ng/L (ref 0–19)

## 2024-10-08 NOTE — ED Triage Notes (Signed)
 Patient brought in by husband from home for L chest pain radiating to L back and L shoulder since waking at 1000. Denies SoB, denies N/V, diarrhea x1 week. Patient reports recent increase in stress over last week. AxOx4, ambulatory with aid baseline.

## 2024-10-08 NOTE — Discharge Instructions (Signed)
 Your workup in the Emergency Department today was reassuring.  We did not find any specific abnormalities.  We recommend you drink plenty of fluids, take your regular medications and/or any new ones prescribed today, and follow up with the doctor(s) listed in these documents as recommended.  Return to the Emergency Department if you develop new or worsening symptoms that concern you.

## 2024-10-08 NOTE — ED Provider Notes (Signed)
 "  [facility] Provider Note    Event Date/Time   First MD Initiated Contact with Patient 10/08/24 2256     (approximate)   History   Chest Pain   HPI [place] Veronica Butler is a 64 y.o. female whose medical history is most notable For chronic radiculopathy and some other associated nerve pain.  She reports no cardiac or pulmonary history although she does have some family members who have had heart issues.  She presents for evaluation of pain that she first noticed when she woke up this morning (is now nearly midnight) that was primarily in her left shoulder.  She started to develop some pain early on in the day and her left-sided chest wall as well.  It is reproducible when she moves her arms and her body in certain ways.  She went to [employer] to workout and thought she might feel a bit better afterwards but she did not.  She has been exercising and doing some stretching and may have pulled something while doing that although she does not really think so.  She has not had any shortness of breath, nausea, nor vomiting associated with the discomfort which she reports is mild and dull.  She has no pain in her abdomen and no urinary symptoms.  She has had intermittent diarrhea for about a week but she feels that is unrelated to her current issues and started after she ate a sandwich at the hospital after visiting a relative.  She has no unilateral leg pain or swelling and has no history of blood clots in the legs of the lungs.     Physical Exam   Triage Vital Signs: ED Triage Vitals  Encounter Vitals Group     BP 10/08/24 2120 (!) 142/104     Girls Systolic BP Percentile --      Girls Diastolic BP Percentile --      Boys Systolic BP Percentile --      Boys Diastolic BP Percentile --      Pulse Rate 10/08/24 2120 87     Resp 10/08/24 2120 16     Temp 10/08/24 2120 98.8 F (37.1 C)     Temp Source 10/08/24 2120 Oral     SpO2 10/08/24 2120 96 %     Weight 10/08/24  2118 85.3 kg (188 lb)     Height 10/08/24 2118 1.651 m (5' 5)     Head Circumference --      Peak Flow --      Pain Score 10/08/24 2118 3     Pain Loc --      Pain Education --      Exclude from Growth Chart --     Most recent vital signs: Vitals:   10/08/24 2120  BP: (!) 142/104  Pulse: 87  Resp: 16  Temp: 98.8 F (37.1 C)  SpO2: 96%    General: Awake, no distress.  CV:  Good peripheral perfusion.  Resp:  Normal effort. Speaking easily and comfortably, no accessory muscle usage nor intercostal retractions.   Abd:  No distention.  No tenderness to palpation of the abdomen and with no guarding. Other:  Patient does have some reproducible chest wall tenderness to palpation on the left anterior chest as well as some reproducible pain when she ranges her left shoulder.   ED Results / Procedures / Treatments   Labs (all labs ordered are listed, but only abnormal results are displayed) Labs Reviewed  BASIC METABOLIC  PANEL WITH GFR - Abnormal; Notable for the following components:      Result Value   Glucose, Bld 169 (*)    All other components within normal limits  CBC  TROPONIN T, HIGH SENSITIVITY     EKG  ED ECG REPORT I, Darleene Dome, the attending physician, personally viewed and interpreted this ECG.  Date: 10/08/2024 EKG Time: 21: 24 Rate: 85 Rhythm: normal sinus rhythm QRS Axis: normal Intervals: normal ST/T Wave abnormalities: Non-specific ST segment / T-wave changes, but no clear evidence of acute ischemia. Narrative Interpretation: no definitive evidence of acute ischemia; does not meet STEMI criteria.    RADIOLOGY I independently viewed and interpret the patient's two-view chest x-ray and I see no evidence of rib fracture, pneumonia, pneumothorax, nor other acute or emergent abnormality.  I also read the radiologist's report, which confirmed no acute findings.   PROCEDURES:  Critical Care performed: No  Procedures    IMPRESSION / MDM /  ASSESSMENT AND PLAN / ED COURSE  I reviewed the triage vital signs and the nursing notes.                              Differential diagnosis includes, but is not limited to, musculoskeletal strain, pneumonia, pneumothorax, rib fracture, less likely ACS or PE.  Patient's presentation is most consistent with acute presentation with potential threat to life or bodily function.  Labs/studies ordered (see ED course for additional labs and studies that may have been added later): 2 view chest x-ray, EKG, high-sensitivity troponin, CBC, BMP  Interventions/Medications given:  Medications - No data to display  (Note:  hospital course my include additional interventions and/or labs/studies not listed above.)   Patient is well-appearing and in no distress, appropriate vital signs other than mild hypertension.  No evidence of acute ischemia on EKG, normal chest x-ray.  Patient is low risk for ACS based on HEART score and based on the timing of her symptoms a second high-sensitivity troponin is not necessary.  Her Wells score for PE is 0.  Symptoms are reproducible with movement.  She and her husband are very comfortable with the reassurance tonight from her workup and the plan for outpatient follow-up.  Initially considered hospitalization for cardiac observation but I feel like that is unnecessary in this patient and she is comfortable following up with her primary care provider rather than with a cardiologist.  I gave my usual and customary return precautions     FINAL CLINICAL IMPRESSION(S) / ED DIAGNOSES   Final diagnoses:  Atypical chest pain  Acute pain of left shoulder     Rx / DC Orders   ED Discharge Orders     None        Note:  This document was prepared using Dragon voice recognition software and may include unintentional dictation errors.   Dome Darleene, MD 10/08/24 2348  "

## 2024-10-20 ENCOUNTER — Ambulatory Visit

## 2024-10-20 VITALS — BP 130/86 | HR 81 | Temp 98.1°F

## 2024-10-20 DIAGNOSIS — R079 Chest pain, unspecified: Secondary | ICD-10-CM

## 2024-10-20 DIAGNOSIS — R197 Diarrhea, unspecified: Secondary | ICD-10-CM | POA: Diagnosis not present

## 2024-10-20 NOTE — Progress Notes (Signed)
 "     Established patient visit   Patient: Veronica Butler   DOB: 04-10-1961   64 y.o. Female  MRN: [id number] Visit Date: 10/20/2024  Today's healthcare provider: Isaiah DELENA Pepper, MD   Chief Complaint  Patient presents with   Hospitalization Follow-up    Chest pains patient thought she was having aheart attack    Diarrhea    Delores to light brown    Subjective    HPI  Discussed the use of AI scribe software for clinical note transcription with the patient, who gave verbal consent to proceed.  History of Present Illness Veronica Butler is a 64 year old female who presents with chest pain and diarrhea following a recent hospital visit.  She has been experiencing chest pain for a week and a half, initially presenting as sharp and persistent pain in the shoulder blade area, radiating through her back and chest. Despite a visit to the emergency room where an x-ray, EKG, and blood work were performed with normal results, the pain has persisted. It is described as uncomfortable and radiating from the shoulder blade, with no shortness of breath or worsening pain during physical activity, such as working out at [employer].  She has been experiencing diarrhea for about two and a half weeks, which started after eating a ham sandwich at the hospital and beginning an antibiotic for a UTI. The diarrhea has been intermittent, with recent episodes described as 'a gassy explosion.' She has a history of irritable bowel syndrome diagnosed in the 1990s and had a colonoscopy last June, which showed three benign polyps. She has been taking a probiotic and digestive enzymes for about fifteen years.  Her family history is significant for heart disease on her paternal side, including stomach aneurysms and heart attacks. Her paternal grandmother died from a burst aneurysm, and her paternal uncle recently had a stent placed for a blockage. She is concerned about her family history and the potential for similar  issues.  She works out at [employer] regularly, performing exercises such as elliptical biking, chest presses, and rowing. No lightheadedness or dizziness reported.   Medications: Show/hide medication list[1]  Review of Systems as noted in HPI.      Objective    BP 130/86   Pulse 81   Temp 98.1 F (36.7 C) (Oral)   LMP 11/05/2000   SpO2 90%    Physical Exam Constitutional:      Appearance: Normal appearance.  HENT:     Head: Normocephalic and atraumatic.     Mouth/Throat:     Mouth: Mucous membranes are moist.  Eyes:     Pupils: Pupils are equal, round, and reactive to light.  Cardiovascular:     Rate and Rhythm: Normal rate and regular rhythm.     Heart sounds: Normal heart sounds.  Pulmonary:     Effort: Pulmonary effort is normal.     Breath sounds: Normal breath sounds.  Chest:     Comments: No TTP of chest wall Skin:    General: Skin is warm.  Neurological:     General: No focal deficit present.     Mental Status: She is alert.      No results found for any visits on 10/20/24.  Assessment & Plan     Problem List Items Addressed This Visit   None Visit Diagnoses       Chest pain, unspecified type    -  Primary   Relevant Orders  Ambulatory referral to Cardiology     Diarrhea of presumed infectious origin       Relevant Orders   Stool culture   GI Profile, Stool, PCR      Assessment & Plan Chest pain, likely musculoskeletal Patient continue to have intermittent L chest pain radiating to the shoulder. Workup in the ED showed normal EKG, negative troponin, normal CHEST X-RAY. No chest pain present in office. Most likely MSK in etiology, however cannot rule out cardiac etiology given strong family hx of heart disease. - Referred to cardiology for further evaluation - Recommended ibuprofen PRN and lidocaine  patches for pain management. - Suggested ice application and gentle stretching exercises. - Encouraged continued physical activity with  caution.  Diarrhea, possible infectious or irritable bowel syndrome flare Patient with intermittent episodes of watery diarrhea, possibly related to recent antibiotic use or dietary intake. History of irritable bowel syndrome. Recent colonoscopy showed benign polyps. No signs of dehydration or systemic illness. - Ordered GI profile stool sample to test for bacterial or parasitic infection. - Advised monitoring for worsening symptoms such as lightheadedness, dizziness, or blood in stool.    No follow-ups on file.       Isaiah DELENA Pepper, MD  [facility] [phone] (phone) [phone] (fax)     [1]  Outpatient Medications Prior to Visit  Medication Sig   Berberine Chloride (BERBERINE HCI PO)    Blood Glucose Monitoring Suppl (ONE TOUCH ULTRA SYSTEM KIT) W/DEVICE KIT GLUCOMETER TEST STRIPS - Historical Medication  Check blood sugar daily ---- ULTRA ONE TOUCH  Started 11-Jul-2009 Active Comments: DX: 790.29   Calcium Acetate, Phos Binder, (CALCIUM ACETATE PO) Take 300 mg by mouth in the morning and at bedtime.   COD LIVER OIL PO Take by mouth.   Cranberry 500 MG CAPS Take 1 capsule by mouth daily.   DIGESTIVE ENZYMES PO Take 1 capsule by mouth daily.   hydrocortisone  2.5 % cream Apply to rash on left arm twice daily until improved.   levothyroxine  (SYNTHROID ) 25 MCG tablet TAKE 1 TABLET BY MOUTH DAILY BEFORE BREAKFAST AND 2 TABLETS BY MOUTH ON THE WEEKENDS   MAGNESIUM PO Take by mouth.   Milk Thistle-Turmeric (SILYMARIN PO) Take by mouth.   MULTIPLE VITAMIN PO Take by mouth.   Omega-3 Fatty Acids (FISH OIL PO) Take by mouth.   Probiotic Product (CULTURELLE PROBIOTICS PO) Take by mouth.   tretinoin  (RETIN-A ) 0.025 % cream Apply pea-sized amount to face at bedtime, wash off in morning.   VITAMIN D  PO Take by mouth.   ALPHA LIPOIC ACID PO Take by mouth. (Patient not taking: Reported on 10/20/2024)   No facility-administered medications prior to visit.   "

## 2024-10-22 ENCOUNTER — Ambulatory Visit: Admitting: Cardiology

## 2024-10-22 ENCOUNTER — Encounter: Payer: Self-pay | Admitting: Cardiology

## 2024-10-22 VITALS — BP 130/80 | HR 80 | Ht 65.0 in | Wt 193.0 lb

## 2024-10-22 DIAGNOSIS — E7800 Pure hypercholesterolemia, unspecified: Secondary | ICD-10-CM

## 2024-10-22 DIAGNOSIS — R079 Chest pain, unspecified: Secondary | ICD-10-CM

## 2024-10-22 DIAGNOSIS — R072 Precordial pain: Secondary | ICD-10-CM

## 2024-10-22 MED ORDER — METOPROLOL TARTRATE 100 MG PO TABS: ORAL_TABLET | ORAL | 0 refills | Status: AC

## 2024-10-22 NOTE — Patient Instructions (Signed)
 Medication Instructions:  - take one 100 mg metoprolol  two hours prior to cardiac CTA   *If you need a refill on your cardiac medications before your next appointment, please call your pharmacy*  Lab Work: No labs ordered today  If you have labs (blood work) drawn today and your tests are completely normal, you will receive your results only by: MyChart Message (if you have MyChart) OR A paper copy in the mail If you have any lab test that is abnormal or we need to change your treatment, we will call you to review the results.  Testing/Procedures: Your physician has requested that you have an echocardiogram. Echocardiography is a painless test that uses sound waves to create images of your heart. It provides your doctor with information about the size and shape of your heart and how well your hearts chambers and valves are working.   You may receive an ultrasound enhancing agent through an IV if needed to better visualize your heart during the echo. This procedure takes approximately one hour.  There are no restrictions for this procedure.  This will take place at [address] [facility]) [address], [place] [address]  Please note: We ask at that you not bring children with you during ultrasound (echo/ vascular) testing. Due to room size and safety concerns, children are not allowed in the ultrasound rooms during exams. Our front office staff cannot provide observation of children in our lobby area while testing is being conducted. An adult accompanying a patient to their appointment will only be allowed in the ultrasound room at the discretion of the ultrasound technician under special circumstances. We apologize for any inconvenience.   Your cardiac CT will be scheduled at one of the below locations:    [facility] [address] [place], [place] [address] [phone]  OR   [facility] [address]   [place], [place] [address]  If scheduled at the Heart and Vascular Tower at [address], please enter the parking lot using the [address] entrance and use the FREE valet service at the patient drop-off area. Enter the building and check-in with registration on the main floor.  If scheduled at [facility], please arrive to the Heart and Vascular Center 15 mins early for check-in and test prep.  There is spacious parking and easy access to the radiology department from the [facility] entrance. Please enter here and check-in with the desk attendant.   Please follow these instructions carefully (unless otherwise directed):  An IV will be required for this test and Nitroglycerin will be given.  Hold all erectile dysfunction medications at least 3 days (72 hrs) prior to test. (Ie viagra, cialis, sildenafil, tadalafil, etc)   On the Night Before the Test: Be sure to Drink plenty of water . Do not consume any caffeinated/decaffeinated beverages or chocolate 12 hours prior to your test. Do not take any antihistamines 12 hours prior to your test.  On the Day of the Test: Drink plenty of water  until 1 hour prior to the test. Do not eat any food 1 hour prior to test. You may take your regular medications prior to the test.  Take metoprolol  (Lopressor ) two hours prior to test. Patients who wear a continuous glucose monitor MUST remove the device prior to scanning. FEMALES- please wear underwire-free bra if available, avoid dresses & tight clothing      After the Test: Drink plenty of  water . After receiving IV contrast, you may experience a mild flushed feeling. This is normal. On occasion, you may experience a mild rash up to 24 hours after the test. This is not dangerous. If this occurs, you can take Benadryl 25 mg, Zyrtec, Claritin, or Allegra and increase your fluid intake. (Patients taking Tikosyn should avoid Benadryl, and may take Zyrtec, Claritin, or  Allegra) If you experience trouble breathing, this can be serious. If it is severe call 911 IMMEDIATELY. If it is mild, please call our office.  We will call to schedule your test 2-4 weeks out understanding that some insurance companies will need an authorization prior to the service being performed.   For more information and frequently asked questions, please visit our website : [email or web address]  For non-scheduling related questions, please contact the cardiac imaging nurse navigator should you have any questions/concerns: Cardiac Imaging Nurse Navigators Direct Office Dial: [phone]   For scheduling needs, including cancellations and rescheduling, please call Brittany, [phone].   Follow-Up: At [facility], you and your health needs are our priority.  As part of our continuing mission to provide you with exceptional heart care, our providers are all part of one team.  This team includes your primary Cardiologist (physician) and Advanced Practice Providers or APPs (Physician Assistants and Nurse Practitioners) who all work together to provide you with the care you need, when you need it.  Your next appointment:   3 month(s)  Provider:   You may see Dr Darliss or one of the following Advanced Practice Providers on your designated Care Team:   Lonni Meager, NP Lesley Maffucci, PA-C Bernardino Bring, PA-C Cadence Matthews, PA-C Tylene Lunch, NP Barnie Hila, NP    We recommend signing up for the patient portal called MyChart.  Sign up information is provided on this After Visit Summary.  MyChart is used to connect with patients for Virtual Visits (Telemedicine).  Patients are able to view lab/test results, encounter notes, upcoming appointments, etc.  Non-urgent messages can be sent to your provider as well.   To learn more about what you can do with MyChart, go to [email or web address].

## 2024-10-22 NOTE — Progress Notes (Signed)
 " Cardiology Office Note:    Date:  10/22/2024   ID:  Veronica Butler, DOB [birth year], MRN [id number]  PCP:  Myrla Jon HERO, MD   [facility] Providers Cardiologist:  None     Referring MD: Franchot Isaiah LABOR, MD   Chief Complaint  Patient presents with   New Patient (Initial Visit)    Pt shoulder pain thr the arm.    History of Present Illness:    Veronica Butler is a 64 y.o. female with a hx of hypothyroidism, presenting with chest pain.  Endorses left shoulder pain radiating to her chest over the past 2 weeks.  Symptoms are not associated with exertion or arm movements.  Presented to the ED on 10/08/2024, workup with EKG, troponins was unrevealing.  Denies any personal history of heart attacks.  Father has a history of a dilated aorta.  Patient would like to get screening due to family history.  Past Medical History:  Diagnosis Date   Allergy    Seasonal   Cancer (HCC) 2002   Cervical CA with hysterectomy.   Cataract    Minor. Born with   Diabetes (HCC) 08/22/2015   Dysplastic nevus 11/02/2019   Left spinal mid back. Moderate atypia, peripheral margin involved   Dysplastic nevus 11/02/2019   Right spinal mid back. Severe atypia, peripheral margin involved. Excised 11/29/2019, margins free.   Dysplastic nevus 05/01/2010   Right medial lower calf. Mild to moderate atypia, extends to one edge.   Dysplastic nevus 03/26/2022   Left Top of Shoulder, mild atypia   Heart murmur    Hx of dysplastic nevus 2007   multiple sites    Hx of dysplastic nevus 2007   multiple sites    Hypertension associated with diabetes (HCC) 04/05/2021   Migraines    PONV (postoperative nausea and vomiting)    Sleep apnea    CPAP, recently stopped using due to 50lb weight loss   Thyroid  disease    Nodules   Trigeminal neuralgia     Past Surgical History:  Procedure Laterality Date   ABDOMINAL HYSTERECTOMY  2002   still has ovaries   CHOLECYSTECTOMY  1998   COLONOSCOPY WITH  PROPOFOL  N/A 10/28/2017   Procedure: COLONOSCOPY WITH PROPOFOL ;  Surgeon: Janalyn Keene NOVAK, MD;  [facility];  Service: Endoscopy;  Laterality: N/A;   COLONOSCOPY WITH PROPOFOL  N/A 02/21/2023   Procedure: COLONOSCOPY WITH PROPOFOL ;  Surgeon: Unk Corinn Skiff, MD;  Location: [facility];  Service: Gastroenterology;  Laterality: N/A;   ESOPHAGOGASTRODUODENOSCOPY (EGD) WITH PROPOFOL  N/A 02/21/2023   Procedure: ESOPHAGOGASTRODUODENOSCOPY (EGD) WITH PROPOFOL ;  Surgeon: Unk Corinn Skiff, MD;  Location: [facility] ENDOSCOPY;  Service: Gastroenterology;  Laterality: N/A;   HERNIA REPAIR     along with gallbladder surgery   navel cyst  1980'   removed   TONSILLECTOMY  at age 26    Current Medications: Active Medications[1]   Allergies:   Cortisone, Tape, Ciprofloxacin, Levaquin [levofloxacin in d5w], and Quinolones   Social History   Socioeconomic History   Marital status: Married    Spouse name: Not on file   Number of children: Not on file   Years of education: Not on file   Highest education level: Associate degree: occupational, scientist, product/process development, or vocational program  Occupational History   Not on file  Tobacco Use   Smoking status: Never   Smokeless tobacco: Never  Substance and Sexual Activity   Alcohol use: No   Drug use: No  Sexual activity: Not on file  Other Topics Concern   Not on file  Social History Narrative   Not on file   Social Drivers of Health   Tobacco Use: Low Risk (10/22/2024)   Patient History    Smoking Tobacco Use: Never    Smokeless Tobacco Use: Never    Passive Exposure: Not on file  Financial Resource Strain: Low Risk (06/27/2024)   Overall Financial Resource Strain (CARDIA)    Difficulty of Paying Living Expenses: Not hard at all  Food Insecurity: No Food Insecurity (06/27/2024)   Epic    Worried About Programme Researcher, Broadcasting/film/video in the Last Year: Never true    Ran Out of Food in the Last Year: Never true  Transportation Needs: No  Transportation Needs (06/27/2024)   Epic    Lack of Transportation (Medical): No    Lack of Transportation (Non-Medical): No  Physical Activity: Sufficiently Active (06/27/2024)   Exercise Vital Sign    Days of Exercise per Week: 7 days    Minutes of Exercise per Session: 50 min  Stress: No Stress Concern Present (06/27/2024)   [employer] of Occupational Health - Occupational Stress Questionnaire    Feeling of Stress: Only a little  Social Connections: Moderately Integrated (06/27/2024)   Social Connection and Isolation Panel    Frequency of Communication with Friends and Family: More than three times a week    Frequency of Social Gatherings with Friends and Family: Once a week    Attends Religious Services: More than 4 times per year    Active Member of Clubs or Organizations: No    Attends Engineer, Structural: Not on file    Marital Status: Married  Depression (PHQ2-9): Low Risk (10/20/2024)   Depression (PHQ2-9)    PHQ-2 Score: 2  Alcohol Screen: Low Risk (08/29/2022)   Alcohol Screen    Last Alcohol Screening Score (AUDIT): 0  Housing: Unknown (06/27/2024)   Epic    Unable to Pay for Housing in the Last Year: No    Number of Times Moved in the Last Year: Not on file    Homeless in the Last Year: No  Utilities: Not At Risk (03/29/2024)   Epic    Threatened with loss of utilities: No  Health Literacy: Adequate Health Literacy (03/29/2024)   B1300 Health Literacy    Frequency of need for help with medical instructions: Never     Family History: The patient's family history includes Alcohol abuse in her father; Basal cell carcinoma in her mother; Cancer in her father and maternal grandmother; Dementia in her maternal grandmother; Diabetes in her maternal aunt and paternal uncle; Hypertension in her mother; Sarcoidosis in her sister.  ROS:   Please see the history of present illness.     All other systems reviewed and are negative.  EKGs/Labs/Other Studies  Reviewed:    The following studies were reviewed today:  EKG Interpretation Date/Time:  Friday October 22 2024 15:04:40 EST Ventricular Rate:  80 PR Interval:  136 QRS Duration:  82 QT Interval:  384 QTC Calculation: 442 R Axis:   -26  Text Interpretation: Normal sinus rhythm Possible Left atrial enlargement Confirmed by Darliss Rogue ([id number]) on 10/22/2024 3:09:21 PM    Recent Labs: 07/02/2024: ALT 22; TSH 1.530 10/08/2024: BUN 18; Creatinine, Ser 0.76; Hemoglobin 13.5; Platelets 227; Potassium 3.7; Sodium 142  Recent Lipid Panel    Component Value Date/Time   CHOL 195 07/02/2024 1041   TRIG 128 07/02/2024 1041  HDL 48 07/02/2024 1041   CHOLHDL 4.1 07/02/2024 1041   CHOLHDL 4.2 08/04/2017 0951   LDLCALC 124 (H) 07/02/2024 1041   LDLCALC 118 (H) 08/04/2017 0951     Risk Assessment/Calculations:             Physical Exam:    VS:  BP 130/80 (BP Location: Left Arm, Patient Position: Sitting, Cuff Size: Normal) Comment: @ 12:27 pm 119/80  Pulse 80 Comment: 97 oximeter  Ht 5' 5 (1.651 m)   Wt 193 lb (87.5 kg)   LMP 11/05/2000   SpO2 97%   BMI 32.12 kg/m     Wt Readings from Last 3 Encounters:  10/22/24 193 lb (87.5 kg)  10/08/24 188 lb (85.3 kg)  09/30/24 194 lb 4.8 oz (88.1 kg)     GEN:  Well nourished, well developed in no acute distress HEENT: Normal NECK: No JVD; No carotid bruits CARDIAC: RRR, no murmurs, rubs, gallops RESPIRATORY:  Clear to auscultation without rales, wheezing or rhonchi  ABDOMEN: Soft, non-tender, non-distended MUSCULOSKELETAL:  No edema; No deformity  SKIN: Warm and dry NEUROLOGIC:  Alert and oriented x 3 PSYCHIATRIC:  Normal affect   ASSESSMENT:    1. Precordial pain   2. Chest pain of uncertain etiology   3. Pure hypercholesterolemia    PLAN:    In order of problems listed above:  Chest pain, appears atypical.  Rule out CAD with coronary CTA.  Obtain echocardiogram. Hyperlipidemia, ASCVD 6%, continue low-cholesterol  diet.    Follow-up after cardiac testing      Medication Adjustments/Labs and Tests Ordered: Current medicines are reviewed at length with the patient today.  Concerns regarding medicines are outlined above.  Orders Placed This Encounter  Procedures   CT CORONARY MORPH W/CTA COR W/SCORE W/CA W/CM &/OR WO/CM   EKG 12-Lead   ECHOCARDIOGRAM COMPLETE   Meds ordered this encounter  Medications   metoprolol  tartrate (LOPRESSOR ) 100 MG tablet    Sig: TAKE 1 TABLET 2 HR PRIOR TO CARDIAC PROCEDURE    Dispense:  1 tablet    Refill:  0    Patient Instructions  Medication Instructions:  - take one 100 mg metoprolol  two hours prior to cardiac CTA   *If you need a refill on your cardiac medications before your next appointment, please call your pharmacy*  Lab Work: No labs ordered today  If you have labs (blood work) drawn today and your tests are completely normal, you will receive your results only by: MyChart Message (if you have MyChart) OR A paper copy in the mail If you have any lab test that is abnormal or we need to change your treatment, we will call you to review the results.  Testing/Procedures: Your physician has requested that you have an echocardiogram. Echocardiography is a painless test that uses sound waves to create images of your heart. It provides your doctor with information about the size and shape of your heart and how well your hearts chambers and valves are working.   You may receive an ultrasound enhancing agent through an IV if needed to better visualize your heart during the echo. This procedure takes approximately one hour.  There are no restrictions for this procedure.  This will take place at [address] [facility]) [address], [place] [address]  Please note: We ask at that you not bring children with you during ultrasound (echo/ vascular) testing. Due to room size and safety concerns, children are not allowed in the ultrasound rooms  during  exams. Our front office staff cannot provide observation of children in our lobby area while testing is being conducted. An adult accompanying a patient to their appointment will only be allowed in the ultrasound room at the discretion of the ultrasound technician under special circumstances. We apologize for any inconvenience.   Your cardiac CT will be scheduled at one of the below locations:    [facility] [address] [place], [place] [address] [phone]  OR   [facility] [address]  [place], [place] [address]  If scheduled at the Heart and Vascular Tower at [address], please enter the parking lot using the [address] entrance and use the FREE valet service at the patient drop-off area. Enter the building and check-in with registration on the main floor.  If scheduled at [facility], please arrive to the Heart and Vascular Center 15 mins early for check-in and test prep.  There is spacious parking and easy access to the radiology department from the [facility] entrance. Please enter here and check-in with the desk attendant.   Please follow these instructions carefully (unless otherwise directed):  An IV will be required for this test and Nitroglycerin will be given.  Hold all erectile dysfunction medications at least 3 days (72 hrs) prior to test. (Ie viagra, cialis, sildenafil, tadalafil, etc)   On the Night Before the Test: Be sure to Drink plenty of water . Do not consume any caffeinated/decaffeinated beverages or chocolate 12 hours prior to your test. Do not take any antihistamines 12 hours prior to your test.  On the Day of the Test: Drink plenty of water  until 1 hour prior to the test. Do not eat any food 1 hour prior to test. You may take your regular medications prior to the test.  Take metoprolol  (Lopressor ) two hours prior to test. Patients who wear a continuous  glucose monitor MUST remove the device prior to scanning. FEMALES- please wear underwire-free bra if available, avoid dresses & tight clothing      After the Test: Drink plenty of water . After receiving IV contrast, you may experience a mild flushed feeling. This is normal. On occasion, you may experience a mild rash up to 24 hours after the test. This is not dangerous. If this occurs, you can take Benadryl 25 mg, Zyrtec, Claritin, or Allegra and increase your fluid intake. (Patients taking Tikosyn should avoid Benadryl, and may take Zyrtec, Claritin, or Allegra) If you experience trouble breathing, this can be serious. If it is severe call 911 IMMEDIATELY. If it is mild, please call our office.  We will call to schedule your test 2-4 weeks out understanding that some insurance companies will need an authorization prior to the service being performed.   For more information and frequently asked questions, please visit our website : [email or web address]  For non-scheduling related questions, please contact the cardiac imaging nurse navigator should you have any questions/concerns: Cardiac Imaging Nurse Navigators Direct Office Dial: [phone]   For scheduling needs, including cancellations and rescheduling, please call Brittany, [phone].   Follow-Up: At [facility], you and your health needs are our priority.  As part of our continuing mission to provide you with exceptional heart care, our providers are all part of one team.  This team includes your primary Cardiologist (physician) and Advanced Practice Providers or APPs (Physician Assistants and Nurse Practitioners) who all work together to provide you with the care  you need, when you need it.  Your next appointment:   3 month(s)  Provider:   You may see Dr Darliss or one of the following Advanced Practice Providers on your designated Care Team:   Lonni Meager, NP Lesley Maffucci, PA-C Bernardino Bring,  PA-C Cadence Amana, PA-C Tylene Lunch, NP Barnie Hila, NP    We recommend signing up for the patient portal called MyChart.  Sign up information is provided on this After Visit Summary.  MyChart is used to connect with patients for Virtual Visits (Telemedicine).  Patients are able to view lab/test results, encounter notes, upcoming appointments, etc.  Non-urgent messages can be sent to your provider as well.   To learn more about what you can do with MyChart, go to [email or web address].              Signed, Redell Darliss, MD  10/22/2024 3:50 PM    [facility]    [1]  Current Meds  Medication Sig   Berberine Chloride (BERBERINE HCI PO)    Blood Glucose Monitoring Suppl (ONE TOUCH ULTRA SYSTEM KIT) W/DEVICE KIT GLUCOMETER TEST STRIPS - Historical Medication  Check blood sugar daily ---- ULTRA ONE TOUCH  Started 11-Jul-2009 Active Comments: DX: 790.29   Calcium Acetate, Phos Binder, (CALCIUM ACETATE PO) Take 300 mg by mouth in the morning and at bedtime.   COD LIVER OIL PO Take by mouth.   Cranberry 500 MG CAPS Take 1 capsule by mouth daily.   DIGESTIVE ENZYMES PO Take 1 capsule by mouth daily.   hydrocortisone  2.5 % cream Apply to rash on left arm twice daily until improved.   levothyroxine  (SYNTHROID ) 25 MCG tablet TAKE 1 TABLET BY MOUTH DAILY BEFORE BREAKFAST AND 2 TABLETS BY MOUTH ON THE WEEKENDS   MAGNESIUM PO Take by mouth.   metoprolol  tartrate (LOPRESSOR ) 100 MG tablet TAKE 1 TABLET 2 HR PRIOR TO CARDIAC PROCEDURE   Milk Thistle-Turmeric (SILYMARIN PO) Take by mouth.   MULTIPLE VITAMIN PO Take by mouth.   Omega-3 Fatty Acids (FISH OIL PO) Take by mouth.   Probiotic Product (CULTURELLE PROBIOTICS PO) Take by mouth.   tretinoin  (RETIN-A ) 0.025 % cream Apply pea-sized amount to face at bedtime, wash off in morning.   VITAMIN D  PO Take by mouth.   "

## 2024-11-02 ENCOUNTER — Ambulatory Visit

## 2024-12-28 ENCOUNTER — Ambulatory Visit: Admitting: Family Medicine

## 2025-01-19 ENCOUNTER — Encounter: Admitting: Dietician

## 2025-01-21 ENCOUNTER — Ambulatory Visit: Admitting: Cardiology

## 2025-04-19 ENCOUNTER — Encounter: Admitting: Dermatology
